# Patient Record
Sex: Female | Born: 1985 | Race: Black or African American | Hispanic: No | Marital: Single | Smoking: Current some day smoker
Health system: Southern US, Community
[De-identification: ages and names within clinical notes are randomized; demographics above are authoritative.]

## PROBLEM LIST (undated history)

## (undated) ENCOUNTER — Inpatient Hospital Stay (HOSPITAL_COMMUNITY): Payer: Self-pay

## (undated) DIAGNOSIS — F419 Anxiety disorder, unspecified: Secondary | ICD-10-CM

## (undated) DIAGNOSIS — F319 Bipolar disorder, unspecified: Secondary | ICD-10-CM

## (undated) DIAGNOSIS — F32A Depression, unspecified: Secondary | ICD-10-CM

## (undated) DIAGNOSIS — F259 Schizoaffective disorder, unspecified: Secondary | ICD-10-CM

## (undated) DIAGNOSIS — O139 Gestational [pregnancy-induced] hypertension without significant proteinuria, unspecified trimester: Secondary | ICD-10-CM

## (undated) DIAGNOSIS — R519 Headache, unspecified: Secondary | ICD-10-CM

## (undated) DIAGNOSIS — J45909 Unspecified asthma, uncomplicated: Secondary | ICD-10-CM

## (undated) DIAGNOSIS — F329 Major depressive disorder, single episode, unspecified: Secondary | ICD-10-CM

## (undated) DIAGNOSIS — R51 Headache: Secondary | ICD-10-CM

## (undated) HISTORY — DX: Schizoaffective disorder, unspecified: F25.9

## (undated) HISTORY — DX: Bipolar disorder, unspecified: F31.9

## (undated) HISTORY — PX: BACK SURGERY: SHX140

## (undated) HISTORY — DX: Headache, unspecified: R51.9

## (undated) HISTORY — DX: Headache: R51

## (undated) HISTORY — PX: DILATION AND CURETTAGE OF UTERUS: SHX78

---

## 2008-06-11 ENCOUNTER — Inpatient Hospital Stay (HOSPITAL_COMMUNITY): Admission: AD | Admit: 2008-06-11 | Discharge: 2008-06-12 | Payer: Self-pay | Admitting: Family Medicine

## 2011-02-18 ENCOUNTER — Inpatient Hospital Stay (INDEPENDENT_AMBULATORY_CARE_PROVIDER_SITE_OTHER): Payer: MEDICAID | Admitting: WVUPC-BEH MED

## 2011-02-18 ENCOUNTER — Encounter (HOSPITAL_BASED_OUTPATIENT_CLINIC_OR_DEPARTMENT_OTHER): Payer: MEDICAID | Admitting: WVUPC-BEH MED

## 2011-02-25 ENCOUNTER — Encounter (HOSPITAL_BASED_OUTPATIENT_CLINIC_OR_DEPARTMENT_OTHER): Payer: MEDICAID | Admitting: Psychiatry

## 2011-03-04 ENCOUNTER — Encounter (HOSPITAL_BASED_OUTPATIENT_CLINIC_OR_DEPARTMENT_OTHER): Payer: MEDICAID | Admitting: Psychiatry

## 2011-04-21 LAB — POCT PREGNANCY, URINE: Preg Test, Ur: POSITIVE

## 2011-04-21 LAB — WET PREP, GENITAL
Clue Cells Wet Prep HPF POC: NONE SEEN
Yeast Wet Prep HPF POC: NONE SEEN

## 2011-04-21 LAB — URINALYSIS, ROUTINE W REFLEX MICROSCOPIC
Hgb urine dipstick: NEGATIVE
Ketones, ur: NEGATIVE
Nitrite: NEGATIVE
pH: 6

## 2011-04-21 LAB — GC/CHLAMYDIA PROBE AMP, GENITAL: Chlamydia, DNA Probe: NEGATIVE

## 2012-03-04 ENCOUNTER — Encounter (HOSPITAL_BASED_OUTPATIENT_CLINIC_OR_DEPARTMENT_OTHER): Payer: No Typology Code available for payment source | Admitting: Psychiatry

## 2012-03-16 ENCOUNTER — Ambulatory Visit (INDEPENDENT_AMBULATORY_CARE_PROVIDER_SITE_OTHER): Payer: No Typology Code available for payment source | Admitting: WVUPC-BEH MED

## 2012-03-16 ENCOUNTER — Encounter (HOSPITAL_BASED_OUTPATIENT_CLINIC_OR_DEPARTMENT_OTHER): Payer: Self-pay | Admitting: WVUPC-BEH MED

## 2012-03-16 VITALS — BP 114/78 | HR 99 | Ht 67.0 in | Wt 214.0 lb

## 2012-03-16 DIAGNOSIS — F411 Generalized anxiety disorder: Secondary | ICD-10-CM

## 2012-03-16 DIAGNOSIS — F419 Anxiety disorder, unspecified: Secondary | ICD-10-CM

## 2012-03-16 DIAGNOSIS — F331 Major depressive disorder, recurrent, moderate: Secondary | ICD-10-CM

## 2012-03-16 HISTORY — DX: Anxiety disorder, unspecified: F41.9

## 2012-03-16 HISTORY — DX: Major depressive disorder, recurrent, moderate (CMS HCC): F33.1

## 2012-03-16 LAB — CBC
BASOPHILS: 1 % (ref 0.0–2.0)
EOSINOPHIL: 5.2 % — ABNORMAL HIGH (ref 0.0–4.0)
HCT: 36.7 % Volume (ref 36.0–47.0)
HGB: 12.1 gm/dL (ref 12.0–15.0)
LYMPHOCYTES: 38.6 % — ABNORMAL HIGH (ref 20.0–35.0)
MCH: 27.3 pg (ref 27.0–32.0)
MCHC: 33.1 % (ref 32.0–36.0)
MCV: 83 fL (ref 80–100)
MONOCYTES: 5.5 % (ref 0.0–8.0)
MPV: 10.7 fL — ABNORMAL HIGH (ref 6.6–9.3)
PLATELET COUNT: 216 10*3/uL (ref 140–450)
PMN'S: 49.7 % — ABNORMAL LOW (ref 50.0–78.0)
RBC: 4.45 10*6/uL (ref 4.20–5.40)
RDW: 15.5 % — ABNORMAL HIGH (ref 12.0–15.0)
WBC: 7.8 10*3/uL (ref 4.8–10.8)

## 2012-03-16 LAB — THYROID STIMULATING HORMONE (SENSITIVE TSH): TSH: 0.758 u[IU]/mL (ref 0.350–5.550)

## 2012-03-16 LAB — COMPREHENSIVE METABOLIC PANEL, NON-FASTING
ALBUMIN: 4.1 gm/dL (ref 3.4–5.0)
ALKALINE PHOSPHATASE: 80 U/L (ref 50–136)
ALT (SGPT): 29 U/L (ref 12–78)
AST (SGOT): 18 U/L (ref 15–37)
BILIRUBIN, TOTAL: 0.4 mg/dL (ref 0.2–1.0)
BUN: 14 mg/dL (ref 7–18)
CALCIUM: 9.2 mg/dL (ref 8.5–10.1)
CARBON DIOXIDE: 26 mmoL/L (ref 21–32)
CHLORIDE: 106 mmoL/L (ref 98–107)
CREATININE: 0.7 mg/dL (ref 0.6–1.3)
ESTIMATED GLOMERULAR FILTRATION RATE: >60 mL/min (ref >60)
GLUCOSE,NONFAST: 81 mg/dL (ref 74–106)
POTASSIUM: 4.1 mmoL/L (ref 3.5–5.1)
SODIUM: 144 mmoL/L (ref 136–145)
TOTAL PROTEIN: 8.6 gm/dL — ABNORMAL HIGH (ref 6.4–8.2)

## 2012-03-16 MED ORDER — ESCITALOPRAM 10 MG TABLET
10.0000 mg | ORAL_TABLET | Freq: Every day | ORAL | Status: DC
Start: 2012-03-16 — End: 2012-04-13

## 2012-03-16 NOTE — Patient Instructions (Signed)
If you need to cancel, reschedule, or change a future appointment, please call [phone] to do so.   You can reach me through voice mail at [phone] and I will do my best to get back to you within 1 business day.   If you have an emergency situation, please call 911 or go to the local emergency room. If you are in the [place] area, [facility] has a psychiatrist from the Behavioral Medicine Department on call at all times in their ED.   Dr.Wellington Winegarden [facility] [employer]
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
## 2012-04-13 ENCOUNTER — Ambulatory Visit (INDEPENDENT_AMBULATORY_CARE_PROVIDER_SITE_OTHER): Payer: No Typology Code available for payment source | Admitting: WVUPC-BEH MED

## 2012-04-13 VITALS — BP 119/86 | HR 90 | Ht 67.0 in | Wt 216.0 lb

## 2012-04-13 DIAGNOSIS — F331 Major depressive disorder, recurrent, moderate: Secondary | ICD-10-CM

## 2012-04-13 DIAGNOSIS — F411 Generalized anxiety disorder: Secondary | ICD-10-CM

## 2012-04-13 MED ORDER — QUETIAPINE 50 MG TABLET
ORAL_TABLET | ORAL | Status: AC
Start: 2012-04-13 — End: ?

## 2012-04-13 NOTE — Patient Instructions (Signed)
If you need to cancel, reschedule, or change a future appointment, please call [phone] to do so.   You can reach me through voice mail at [phone] and I will do my best to get back to you within 1 business day.   If you have an emergency situation, please call 911 or go to the local emergency room. If you are in the [place] area, [facility] has a psychiatrist from the Behavioral Medicine Department on call at all times in their ED.   Dr.Mayar Whittier [employer]
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
## 2012-05-11 ENCOUNTER — Encounter (HOSPITAL_BASED_OUTPATIENT_CLINIC_OR_DEPARTMENT_OTHER): Payer: No Typology Code available for payment source | Admitting: WVUPC-BEH MED

## 2012-05-13 ENCOUNTER — Ambulatory Visit (INDEPENDENT_AMBULATORY_CARE_PROVIDER_SITE_OTHER): Payer: No Typology Code available for payment source | Admitting: WVUPC-BEH MED

## 2012-05-13 VITALS — BP 130/87 | HR 73 | Ht 67.0 in | Wt 227.4 lb

## 2012-05-13 DIAGNOSIS — F411 Generalized anxiety disorder: Secondary | ICD-10-CM

## 2012-05-13 DIAGNOSIS — F1020 Alcohol dependence, uncomplicated: Secondary | ICD-10-CM

## 2012-05-13 DIAGNOSIS — F331 Major depressive disorder, recurrent, moderate: Secondary | ICD-10-CM
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
## 2012-05-18 ENCOUNTER — Encounter (HOSPITAL_BASED_OUTPATIENT_CLINIC_OR_DEPARTMENT_OTHER): Payer: No Typology Code available for payment source | Admitting: WVUPC-BEH MED

## 2012-05-26 ENCOUNTER — Encounter (HOSPITAL_BASED_OUTPATIENT_CLINIC_OR_DEPARTMENT_OTHER): Payer: No Typology Code available for payment source | Admitting: WVUPC-BEH MED

## 2012-05-27 NOTE — Progress Notes (Addendum)
pls see progress notes dated 05/13/2012

## 2012-06-03 ENCOUNTER — Telehealth (HOSPITAL_BASED_OUTPATIENT_CLINIC_OR_DEPARTMENT_OTHER): Payer: Self-pay | Admitting: WVUPC-BEH MED

## 2013-08-19 DIAGNOSIS — N9489 Other specified conditions associated with female genital organs and menstrual cycle: Secondary | ICD-10-CM

## 2013-08-19 DIAGNOSIS — O209 Hemorrhage in early pregnancy, unspecified: Secondary | ICD-10-CM

## 2013-08-19 DIAGNOSIS — R1110 Vomiting, unspecified: Secondary | ICD-10-CM

## 2014-11-09 ENCOUNTER — Encounter (INDEPENDENT_AMBULATORY_CARE_PROVIDER_SITE_OTHER): Payer: Self-pay | Admitting: Internal Medicine

## 2014-11-30 ENCOUNTER — Encounter (INDEPENDENT_AMBULATORY_CARE_PROVIDER_SITE_OTHER): Payer: Self-pay | Admitting: Psychiatry

## 2014-11-30 NOTE — Progress Notes (Signed)
Paper chart need to complete this request.

## 2014-12-03 NOTE — Progress Notes (Signed)
Records faxed to:  [phone].

## 2015-12-06 ENCOUNTER — Ambulatory Visit (HOSPITAL_COMMUNITY): Payer: Self-pay | Admitting: NURSE PRACTITIONER

## 2016-10-26 DIAGNOSIS — O209 Hemorrhage in early pregnancy, unspecified: Secondary | ICD-10-CM

## 2016-10-26 DIAGNOSIS — Z3A01 Less than 8 weeks gestation of pregnancy: Secondary | ICD-10-CM

## 2016-10-26 DIAGNOSIS — R102 Pelvic and perineal pain: Secondary | ICD-10-CM

## 2016-11-09 DIAGNOSIS — Z3A09 9 weeks gestation of pregnancy: Secondary | ICD-10-CM

## 2016-11-09 DIAGNOSIS — O26891 Other specified pregnancy related conditions, first trimester: Secondary | ICD-10-CM

## 2016-11-09 DIAGNOSIS — R102 Pelvic and perineal pain: Secondary | ICD-10-CM

## 2017-03-05 DIAGNOSIS — G510 Bell's palsy: Secondary | ICD-10-CM | POA: Insufficient documentation

## 2017-07-20 NOTE — L&D Delivery Note (Signed)
Patient is a 32 y.o. now G13P8 s/p NSVD at 503w2d, who was admitted for IOL for severe PEC.  She progressed with augmentation (Cytotec, Pitocin, AROM)  to complete and pushed less than a minutes to deliver.  Cord clamping delayed by several minutes then clamped by CNM and cut by sister of patient.  Placenta intact and spontaneous, bleeding moderate (Cytotec 800mcg given). Mom and baby stable prior to transfer to postpartum. She plans on breastfeeding. She requests Nexplanon for birth control.  Delivery Note At 4:03 AM a viable and healthy female was delivered via Vaginal, Spontaneous (Presentation: OA).  APGAR: 8, 9; weight pending.   Placenta intact and spontaneous, bleeding moderate. 3V Cord.    Anesthesia:  Epidural  Episiotomy: None Lacerations: None Suture Repair: None Est. Blood Loss (mL): 463  Mom to postpartum.  Baby to Couplet care / Skin to Skin.  Sharyon CableVeronica C Leota Maka CNM 07/04/2018, 4:41 AM

## 2017-12-04 DIAGNOSIS — O26851 Spotting complicating pregnancy, first trimester: Secondary | ICD-10-CM

## 2017-12-04 DIAGNOSIS — R109 Unspecified abdominal pain: Secondary | ICD-10-CM

## 2017-12-04 DIAGNOSIS — Z3A01 Less than 8 weeks gestation of pregnancy: Secondary | ICD-10-CM

## 2018-02-11 ENCOUNTER — Inpatient Hospital Stay (HOSPITAL_COMMUNITY): Payer: Medicare Other

## 2018-02-11 ENCOUNTER — Inpatient Hospital Stay (HOSPITAL_COMMUNITY)
Admission: AD | Admit: 2018-02-11 | Discharge: 2018-02-11 | Disposition: A | Discharge status: Home / Self Care | Payer: Medicare Other | Source: Ambulatory Visit | Attending: Obstetrics and Gynecology | Admitting: Obstetrics and Gynecology

## 2018-02-11 ENCOUNTER — Inpatient Hospital Stay (HOSPITAL_BASED_OUTPATIENT_CLINIC_OR_DEPARTMENT_OTHER): Payer: Medicare Other

## 2018-02-11 ENCOUNTER — Encounter (HOSPITAL_COMMUNITY): Payer: Self-pay | Admitting: *Deleted

## 2018-02-11 DIAGNOSIS — R109 Unspecified abdominal pain: Secondary | ICD-10-CM | POA: Diagnosis not present

## 2018-02-11 DIAGNOSIS — O99512 Diseases of the respiratory system complicating pregnancy, second trimester: Secondary | ICD-10-CM | POA: Diagnosis not present

## 2018-02-11 DIAGNOSIS — F419 Anxiety disorder, unspecified: Secondary | ICD-10-CM | POA: Diagnosis not present

## 2018-02-11 DIAGNOSIS — O26891 Other specified pregnancy related conditions, first trimester: Secondary | ICD-10-CM

## 2018-02-11 DIAGNOSIS — Z3A16 16 weeks gestation of pregnancy: Secondary | ICD-10-CM | POA: Insufficient documentation

## 2018-02-11 DIAGNOSIS — O26892 Other specified pregnancy related conditions, second trimester: Secondary | ICD-10-CM

## 2018-02-11 DIAGNOSIS — F329 Major depressive disorder, single episode, unspecified: Secondary | ICD-10-CM | POA: Diagnosis not present

## 2018-02-11 DIAGNOSIS — R51 Headache: Secondary | ICD-10-CM | POA: Insufficient documentation

## 2018-02-11 DIAGNOSIS — Z87891 Personal history of nicotine dependence: Secondary | ICD-10-CM | POA: Insufficient documentation

## 2018-02-11 DIAGNOSIS — Z87442 Personal history of urinary calculi: Secondary | ICD-10-CM | POA: Diagnosis not present

## 2018-02-11 DIAGNOSIS — J45909 Unspecified asthma, uncomplicated: Secondary | ICD-10-CM | POA: Insufficient documentation

## 2018-02-11 DIAGNOSIS — O99212 Obesity complicating pregnancy, second trimester: Secondary | ICD-10-CM | POA: Diagnosis not present

## 2018-02-11 DIAGNOSIS — O26899 Other specified pregnancy related conditions, unspecified trimester: Secondary | ICD-10-CM

## 2018-02-11 DIAGNOSIS — O99342 Other mental disorders complicating pregnancy, second trimester: Secondary | ICD-10-CM | POA: Insufficient documentation

## 2018-02-11 DIAGNOSIS — M549 Dorsalgia, unspecified: Secondary | ICD-10-CM | POA: Diagnosis not present

## 2018-02-11 DIAGNOSIS — O99891 Other specified diseases and conditions complicating pregnancy: Secondary | ICD-10-CM

## 2018-02-11 DIAGNOSIS — O9989 Other specified diseases and conditions complicating pregnancy, childbirth and the puerperium: Secondary | ICD-10-CM

## 2018-02-11 HISTORY — DX: Major depressive disorder, single episode, unspecified: F32.9

## 2018-02-11 HISTORY — DX: Depression, unspecified: F32.A

## 2018-02-11 HISTORY — DX: Unspecified asthma, uncomplicated: J45.909

## 2018-02-11 HISTORY — DX: Gestational (pregnancy-induced) hypertension without significant proteinuria, unspecified trimester: O13.9

## 2018-02-11 HISTORY — DX: Anxiety disorder, unspecified: F41.9

## 2018-02-11 LAB — COMPREHENSIVE METABOLIC PANEL
ALT: 12 U/L (ref 0–44)
AST: 15 U/L (ref 15–41)
Albumin: 3.5 g/dL (ref 3.5–5.0)
Alkaline Phosphatase: 34 U/L — ABNORMAL LOW (ref 38–126)
Anion gap: 10 (ref 5–15)
BUN: 8 mg/dL (ref 6–20)
CO2: 21 mmol/L — ABNORMAL LOW (ref 22–32)
Calcium: 9.2 mg/dL (ref 8.9–10.3)
Chloride: 104 mmol/L (ref 98–111)
Creatinine, Ser: 0.46 mg/dL (ref 0.44–1.00)
GFR calc Af Amer: >60 mL/min (ref >60)
GFR calc non Af Amer: >60 mL/min (ref >60)
Glucose, Bld: 101 mg/dL — ABNORMAL HIGH (ref 70–99)
Potassium: 3.7 mmol/L (ref 3.5–5.1)
Sodium: 135 mmol/L (ref 135–145)
Total Bilirubin: 0.6 mg/dL (ref 0.3–1.2)
Total Protein: 7.3 g/dL (ref 6.5–8.1)

## 2018-02-11 LAB — CBC WITH DIFFERENTIAL/PLATELET
Basophils Absolute: 0.1 10*3/uL (ref 0.0–0.1)
Basophils Relative: 1 %
Eosinophils Absolute: 0.3 10*3/uL (ref 0.0–0.7)
Eosinophils Relative: 3 %
HCT: 38.8 % (ref 36.0–46.0)
Hemoglobin: 13.5 g/dL (ref 12.0–15.0)
Lymphocytes Relative: 38 %
Lymphs Abs: 3.2 10*3/uL (ref 0.7–4.0)
MCH: 30.6 pg (ref 26.0–34.0)
MCHC: 34.8 g/dL (ref 30.0–36.0)
MCV: 88 fL (ref 78.0–100.0)
Monocytes Absolute: 0.7 10*3/uL (ref 0.1–1.0)
Monocytes Relative: 8 %
Neutro Abs: 4.2 10*3/uL (ref 1.7–7.7)
Neutrophils Relative %: 50 %
Platelets: 176 10*3/uL (ref 150–400)
RBC: 4.41 MIL/uL (ref 3.87–5.11)
RDW: 13.8 % (ref 11.5–15.5)
WBC: 8.5 10*3/uL (ref 4.0–10.5)

## 2018-02-11 LAB — URINALYSIS, ROUTINE W REFLEX MICROSCOPIC
Bilirubin Urine: NEGATIVE
GLUCOSE, UA: NEGATIVE mg/dL
HGB URINE DIPSTICK: NEGATIVE
KETONES UR: NEGATIVE mg/dL
Leukocytes, UA: NEGATIVE
Nitrite: NEGATIVE
Protein, ur: NEGATIVE mg/dL
Specific Gravity, Urine: 1.024 (ref 1.005–1.030)
pH: 5 (ref 5.0–8.0)

## 2018-02-11 LAB — POCT PREGNANCY, URINE: PREG TEST UR: POSITIVE — AB

## 2018-02-11 MED ORDER — CYCLOBENZAPRINE HCL 10 MG PO TABS: 10.0000 mg | ORAL_TABLET | Freq: Two times a day (BID) | ORAL | 0 refills | Status: DC | PRN

## 2018-02-11 MED ORDER — CYCLOBENZAPRINE HCL 10 MG PO TABS
10.0000 mg | ORAL_TABLET | Freq: Once | ORAL | Status: AC
  Administered 2018-02-11: 10 mg via ORAL
  Filled 2018-02-11: qty 1

## 2018-02-11 NOTE — MAU Provider Note (Signed)
Chief Complaint: Back Pain and Headache   First Provider Initiated Contact with Patient 02/11/18 2025      SUBJECTIVE HPI: Brianna Booth is a 31 y.o. [id number] at [redacted]w[redacted]d by LMP who presents to maternity admissions reporting back pain and abdominal pain. She reports the pain has been occurring for the past week, rates pain 10/10- has taken Tylenol and Aleve for pain with no relief. She last took Tylenol this morning. She reports back pain and abdominal pain is a throbbing sensation. She reports pain as being the same pain when she had a kidney stone, she has a hx of kidney stones and reports the last one being in 2017. She reports urinary symptoms of burning when she urinates and reports urine being dark yellow with no odor. She denies vaginal bleeding, vaginal itching/burning, dizziness, n/v, or fever/chills. She recently moved from [place] and has not started prenatal care.   Past Medical History:  Diagnosis Date  . Anxiety   . Asthma   . Depression   . Pregnancy induced hypertension    Past Surgical History:  Procedure Laterality Date  . DILATION AND CURETTAGE OF UTERUS     Social History   Socioeconomic History  . Marital status: Single    Spouse name: Not on file  . Number of children: Not on file  . Years of education: Not on file  . Highest education level: Not on file  Occupational History  . Not on file  Social Needs  . Financial resource strain: Not on file  . Food insecurity:    Worry: Not on file    Inability: Not on file  . Transportation needs:    Medical: Not on file    Non-medical: Not on file  Tobacco Use  . Smoking status: Former Smoker    Last attempt to quit: 12/12/2017    Years since quitting: 0.1  . Smokeless tobacco: Never Used  Substance and Sexual Activity  . Alcohol use: Never    Frequency: Never  . Drug use: Never  . Sexual activity: Yes  Lifestyle  . Physical activity:    Days per week: Not on file    Minutes per session: Not on file   . Stress: Not on file  Relationships  . Social connections:    Talks on phone: Not on file    Gets together: Not on file    Attends religious service: Not on file    Active member of club or organization: Not on file    Attends meetings of clubs or organizations: Not on file    Relationship status: Not on file  . Intimate partner violence:    Fear of current or ex partner: Not on file    Emotionally abused: Not on file    Physically abused: Not on file    Forced sexual activity: Not on file  Other Topics Concern  . Not on file  Social History Narrative  . Not on file   No current facility-administered medications on file prior to encounter.    No current outpatient medications on file prior to encounter.   No Known Allergies  ROS:  Review of Systems  Constitutional: Negative.   Respiratory: Negative.   Cardiovascular: Negative.   Gastrointestinal: Positive for abdominal pain. Negative for constipation, diarrhea, nausea and vomiting.  Genitourinary: Positive for dysuria and flank pain. Negative for difficulty urinating, frequency, hematuria, urgency, vaginal bleeding and vaginal discharge.  Musculoskeletal: Positive for back pain.   I have reviewed patient's  Past Medical Hx, Surgical Hx, Family Hx, Social Hx, medications and allergies.   Physical Exam   Patient Vitals for the past 24 hrs:  BP Temp Pulse Resp Height Weight  02/11/18 2240 (!) 131/93 - 78 18 - -  02/11/18 1933 131/82 - 79 - - -  02/11/18 1931 - 98.6 F (37 C) - 18 5\' 7"  (1.702 m) 227 lb (103 kg)   Constitutional: Well-developed, well-nourished female in no acute distress.  Cardiovascular: normal rate Respiratory: normal effort GI: Abd soft, suprapubic tender. Pos BS x 4 MS: Extremities nontender, no edema, normal ROM Neurologic: Alert and oriented x 4.  GU: Pos CVAT. PELVIC EXAM: deferred  FHT 152 by doppler  LAB RESULTS Results for orders placed or performed during the hospital encounter of  02/11/18 (from the past 24 hour(s))  Urinalysis, Routine w reflex microscopic     Status: None   Collection Time: 02/11/18  7:43 PM  Result Value Ref Range   Color, Urine YELLOW YELLOW   APPearance CLEAR CLEAR   Specific Gravity, Urine 1.024 1.005 - 1.030   pH 5.0 5.0 - 8.0   Glucose, UA NEGATIVE NEGATIVE mg/dL   Hgb urine dipstick NEGATIVE NEGATIVE   Bilirubin Urine NEGATIVE NEGATIVE   Ketones, ur NEGATIVE NEGATIVE mg/dL   Protein, ur NEGATIVE NEGATIVE mg/dL   Nitrite NEGATIVE NEGATIVE   Leukocytes, UA NEGATIVE NEGATIVE  Pregnancy, urine POC     Status: Abnormal   Collection Time: 02/11/18  7:54 PM  Result Value Ref Range   Preg Test, Ur POSITIVE (A) NEGATIVE  CBC with Differential     Status: None   Collection Time: 02/11/18  8:43 PM  Result Value Ref Range   WBC 8.5 4.0 - 10.5 K/uL   RBC 4.41 3.87 - 5.11 MIL/uL   Hemoglobin 13.5 12.0 - 15.0 g/dL   HCT 16.1 09.6 - 04.5 %   MCV 88.0 78.0 - 100.0 fL   MCH 30.6 26.0 - 34.0 pg   MCHC 34.8 30.0 - 36.0 g/dL   RDW 40.9 81.1 - 91.4 %   Platelets 176 150 - 400 K/uL   Neutrophils Relative % 50 %   Neutro Abs 4.2 1.7 - 7.7 K/uL   Lymphocytes Relative 38 %   Lymphs Abs 3.2 0.7 - 4.0 K/uL   Monocytes Relative 8 %   Monocytes Absolute 0.7 0.1 - 1.0 K/uL   Eosinophils Relative 3 %   Eosinophils Absolute 0.3 0.0 - 0.7 K/uL   Basophils Relative 1 %   Basophils Absolute 0.1 0.0 - 0.1 K/uL  Comprehensive metabolic panel     Status: Abnormal   Collection Time: 02/11/18  8:43 PM  Result Value Ref Range   Sodium 135 135 - 145 mmol/L   Potassium 3.7 3.5 - 5.1 mmol/L   Chloride 104 98 - 111 mmol/L   CO2 21 (L) 22 - 32 mmol/L   Glucose, Bld 101 (H) 70 - 99 mg/dL   BUN 8 6 - 20 mg/dL   Creatinine, Ser 7.82 0.44 - 1.00 mg/dL   Calcium 9.2 8.9 - 95.6 mg/dL   Total Protein 7.3 6.5 - 8.1 g/dL   Albumin 3.5 3.5 - 5.0 g/dL   AST 15 15 - 41 U/L   ALT 12 0 - 44 U/L   Alkaline Phosphatase 34 (L) 38 - 126 U/L   Total Bilirubin 0.6 0.3 - 1.2  mg/dL   GFR calc non Af Amer >60 >60 mL/min   GFR calc Af Amer >  60 >60 mL/min   Anion gap 10 5 - 15    IMAGING [place] Renal  Result Date: 02/11/2018 CLINICAL DATA:  Low abdominal and back pain for 1 week. Dysuria. Urinalysis is normal. History of renal calculi. Patient is pregnant. EXAM: RENAL / URINARY TRACT ULTRASOUND COMPLETE COMPARISON:  None. FINDINGS: Right Kidney: Length: 12.1 cm. Echogenicity within normal limits. No mass or hydronephrosis visualized. Left Kidney: Length: 12.1 cm. Echogenicity within normal limits. No mass or hydronephrosis visualized. Bladder: No bladder wall thickening or filling defects. Bilateral urine flow jets are demonstrated on color flow Doppler imaging. A pregnancy is incidentally noted in the pelvis but is not evaluated on this examination. IMPRESSION: Normal ultrasound appearance of the kidneys and bladder. No hydronephrosis. Electronically Signed   By: Burman NievesWilliam  Stevens M.D.   On: 02/11/2018 22:01    MAU Management/MDM: Orders Placed This Encounter  Procedures  . Culture, OB Urine  . US RENAL  . US MFM OB LIMITED  . Urinalysis, Routine w reflex microscopic  . CBC with Differential  . Comprehensive metabolic panel  . Pregnancy, urine POC  . Discharge patient Discharge disposition: 01-Home or Self Care; Discharge patient date: 02/11/2018   Urine culture- pending  US reviewed with patient with new due date of 07/27/18 and 1413w2d gestation  CBC and CMP- WNL   Meds ordered this encounter  Medications  . cyclobenzaprine (FLEXERIL) tablet 10 mg  . cyclobenzaprine (FLEXERIL) 10 MG tablet    Sig: Take 1 tablet (10 mg total) by mouth 2 (two) times daily as needed for muscle spasms.    Dispense:  15 tablet    Refill:  0    Order Specific Question:   Supervising Provider    Answer:   Tilda BurrowFERGUSON, JOHN V [[id number]]   Treatments in MAU included Flexeril for back pain, patient reports relief of back pain with medication. US results discussed with patient. List of OBGYN  providers in [place] given. Pt discharged. Pt stable at time of discharge.   ASSESSMENT 1. Back pain during pregnancy in second trimester   2. Abdominal pain during pregnancy   3. [redacted] weeks gestation of pregnancy     PLAN Discharge home Make appointment to be seen for prenatal appointment  Return to MAU as needed for emergencies  Rx for Flexeril PRN  Safe medication list given  List of OB providers given    Allergies as of 02/11/2018   No Known Allergies     Medication List    TAKE these medications   acetaminophen 500 MG tablet Commonly known as:  TYLENOL Take 1,000 mg by mouth every 6 (six) hours as needed for moderate pain.   albuterol 108 (90 Base) MCG/ACT inhaler Commonly known as:  PROVENTIL HFA;VENTOLIN HFA Inhale 1-2 puffs into the lungs every 6 (six) hours as needed for wheezing or shortness of breath.   alprazolam 2 MG tablet Commonly known as:  XANAX Take 2 mg by mouth 4 (four) times daily as needed for anxiety.   ARIPiprazole 20 MG tablet Commonly known as:  ABILIFY Take 20 mg by mouth daily.   busPIRone 30 MG tablet Commonly known as:  BUSPAR Take 30 mg by mouth at bedtime.   cyclobenzaprine 10 MG tablet Commonly known as:  FLEXERIL Take 1 tablet (10 mg total) by mouth 2 (two) times daily as needed for muscle spasms.   diazepam 10 MG tablet Commonly known as:  VALIUM Take 10 mg by mouth at bedtime as needed for anxiety.  hydrOXYzine 50 MG capsule Commonly known as:  VISTARIL Take 100 mg by mouth 2 (two) times daily.   lamoTRIgine 200 MG tablet Commonly known as:  LAMICTAL Take 200 mg by mouth 3 (three) times daily.   montelukast 10 MG tablet Commonly known as:  SINGULAIR Take 10 mg by mouth at bedtime.   zolpidem 10 MG tablet Commonly known as:  AMBIEN Take 10 mg by mouth at bedtime as needed for sleep.       Steward Drone  Certified Nurse-Midwife 02/12/2018  1:15 AM

## 2018-02-11 NOTE — MAU Note (Signed)
Pain in both sides of back and lower abd. Sometimes hurts when I pee. Symptoms present fo about a wk. Hx kidney stones. Pos upt at home. LMP sometime in April. No PNC yet

## 2018-02-13 LAB — CULTURE, OB URINE

## 2018-02-19 ENCOUNTER — Emergency Department (HOSPITAL_COMMUNITY): Payer: No Typology Code available for payment source

## 2018-02-19 ENCOUNTER — Emergency Department (HOSPITAL_COMMUNITY)
Admission: EM | Admit: 2018-02-19 | Discharge: 2018-02-20 | Disposition: A | Discharge status: Home / Self Care | Payer: No Typology Code available for payment source | Attending: Emergency Medicine | Admitting: Emergency Medicine

## 2018-02-19 ENCOUNTER — Encounter (HOSPITAL_COMMUNITY): Payer: Self-pay | Admitting: *Deleted

## 2018-02-19 ENCOUNTER — Other Ambulatory Visit: Payer: Self-pay

## 2018-02-19 DIAGNOSIS — J45909 Unspecified asthma, uncomplicated: Secondary | ICD-10-CM | POA: Diagnosis not present

## 2018-02-19 DIAGNOSIS — Y939 Activity, unspecified: Secondary | ICD-10-CM | POA: Diagnosis not present

## 2018-02-19 DIAGNOSIS — Z87891 Personal history of nicotine dependence: Secondary | ICD-10-CM | POA: Insufficient documentation

## 2018-02-19 DIAGNOSIS — M7918 Myalgia, other site: Secondary | ICD-10-CM

## 2018-02-19 DIAGNOSIS — R102 Pelvic and perineal pain: Secondary | ICD-10-CM | POA: Insufficient documentation

## 2018-02-19 DIAGNOSIS — M542 Cervicalgia: Secondary | ICD-10-CM | POA: Insufficient documentation

## 2018-02-19 DIAGNOSIS — Z79899 Other long term (current) drug therapy: Secondary | ICD-10-CM | POA: Insufficient documentation

## 2018-02-19 DIAGNOSIS — O9989 Other specified diseases and conditions complicating pregnancy, childbirth and the puerperium: Secondary | ICD-10-CM | POA: Insufficient documentation

## 2018-02-19 DIAGNOSIS — Z3A18 18 weeks gestation of pregnancy: Secondary | ICD-10-CM | POA: Diagnosis not present

## 2018-02-19 DIAGNOSIS — Y929 Unspecified place or not applicable: Secondary | ICD-10-CM | POA: Insufficient documentation

## 2018-02-19 DIAGNOSIS — Y999 Unspecified external cause status: Secondary | ICD-10-CM | POA: Diagnosis not present

## 2018-02-19 LAB — URINALYSIS, ROUTINE W REFLEX MICROSCOPIC
BILIRUBIN URINE: NEGATIVE
GLUCOSE, UA: NEGATIVE mg/dL
HGB URINE DIPSTICK: NEGATIVE
KETONES UR: NEGATIVE mg/dL
Leukocytes, UA: NEGATIVE
Nitrite: NEGATIVE
PROTEIN: NEGATIVE mg/dL
Specific Gravity, Urine: 1.028 (ref 1.005–1.030)
pH: 5 (ref 5.0–8.0)

## 2018-02-19 LAB — COMPREHENSIVE METABOLIC PANEL
ALT: 10 U/L (ref 0–44)
AST: 13 U/L — ABNORMAL LOW (ref 15–41)
Albumin: 3.2 g/dL — ABNORMAL LOW (ref 3.5–5.0)
Alkaline Phosphatase: 37 U/L — ABNORMAL LOW (ref 38–126)
Anion gap: 8 (ref 5–15)
BILIRUBIN TOTAL: 0.2 mg/dL — AB (ref 0.3–1.2)
BUN: 6 mg/dL (ref 6–20)
CO2: 21 mmol/L — ABNORMAL LOW (ref 22–32)
CREATININE: 0.41 mg/dL — AB (ref 0.44–1.00)
Calcium: 8.7 mg/dL — ABNORMAL LOW (ref 8.9–10.3)
Chloride: 109 mmol/L (ref 98–111)
GFR calc Af Amer: >60 mL/min (ref >60)
Glucose, Bld: 75 mg/dL (ref 70–99)
Potassium: 3.7 mmol/L (ref 3.5–5.1)
SODIUM: 138 mmol/L (ref 135–145)
TOTAL PROTEIN: 6.5 g/dL (ref 6.5–8.1)

## 2018-02-19 LAB — CBC WITH DIFFERENTIAL/PLATELET
BASOS ABS: 0 10*3/uL (ref 0.0–0.1)
BASOS PCT: 0 %
EOS ABS: 0.2 10*3/uL (ref 0.0–0.7)
EOS PCT: 2 %
HCT: 35.6 % — ABNORMAL LOW (ref 36.0–46.0)
Hemoglobin: 11.9 g/dL — ABNORMAL LOW (ref 12.0–15.0)
LYMPHS ABS: 3.1 10*3/uL (ref 0.7–4.0)
Lymphocytes Relative: 34 %
MCH: 30 pg (ref 26.0–34.0)
MCHC: 33.4 g/dL (ref 30.0–36.0)
MCV: 89.7 fL (ref 78.0–100.0)
Monocytes Absolute: 0.9 10*3/uL (ref 0.1–1.0)
Monocytes Relative: 10 %
Neutro Abs: 4.8 10*3/uL (ref 1.7–7.7)
Neutrophils Relative %: 54 %
PLATELETS: 144 10*3/uL — AB (ref 150–400)
RBC: 3.97 MIL/uL (ref 3.87–5.11)
RDW: 13.6 % (ref 11.5–15.5)
WBC: 9.1 10*3/uL (ref 4.0–10.5)

## 2018-02-19 LAB — LIPASE, BLOOD: LIPASE: 31 U/L (ref 11–51)

## 2018-02-19 MED ORDER — PRENATAL COMPLETE 14-0.4 MG PO TABS: 1.0000 | ORAL_TABLET | Freq: Every day | ORAL | 6 refills | Status: AC

## 2018-02-19 MED ORDER — CYCLOBENZAPRINE HCL 10 MG PO TABS: 10.0000 mg | ORAL_TABLET | Freq: Two times a day (BID) | ORAL | 0 refills | Status: DC | PRN

## 2018-02-19 MED ORDER — CYCLOBENZAPRINE HCL 10 MG PO TABS
10.0000 mg | ORAL_TABLET | Freq: Once | ORAL | Status: AC
  Administered 2018-02-19: 10 mg via ORAL
  Filled 2018-02-19: qty 1

## 2018-02-19 MED ORDER — ACETAMINOPHEN 500 MG PO TABS
1000.0000 mg | ORAL_TABLET | Freq: Once | ORAL | Status: AC
  Administered 2018-02-19: 1000 mg via ORAL
  Filled 2018-02-19: qty 2
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
## 2018-02-19 NOTE — ED Notes (Signed)
Patient is unable to urinate at this time but stated she would be willing to try "in the next few minutes".
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
## 2018-02-21 ENCOUNTER — Encounter (HOSPITAL_COMMUNITY): Payer: Self-pay

## 2018-02-21 ENCOUNTER — Emergency Department (HOSPITAL_COMMUNITY): Payer: No Typology Code available for payment source

## 2018-02-21 ENCOUNTER — Other Ambulatory Visit: Payer: Self-pay

## 2018-02-21 ENCOUNTER — Emergency Department (HOSPITAL_COMMUNITY)
Admission: EM | Admit: 2018-02-21 | Discharge: 2018-02-21 | Disposition: A | Discharge status: Home / Self Care | Payer: No Typology Code available for payment source | Attending: Emergency Medicine | Admitting: Emergency Medicine

## 2018-02-21 DIAGNOSIS — R51 Headache: Secondary | ICD-10-CM | POA: Diagnosis not present

## 2018-02-21 DIAGNOSIS — O9989 Other specified diseases and conditions complicating pregnancy, childbirth and the puerperium: Secondary | ICD-10-CM | POA: Diagnosis present

## 2018-02-21 DIAGNOSIS — R1030 Lower abdominal pain, unspecified: Secondary | ICD-10-CM | POA: Diagnosis not present

## 2018-02-21 DIAGNOSIS — Z3492 Encounter for supervision of normal pregnancy, unspecified, second trimester: Secondary | ICD-10-CM

## 2018-02-21 DIAGNOSIS — Z79899 Other long term (current) drug therapy: Secondary | ICD-10-CM | POA: Insufficient documentation

## 2018-02-21 DIAGNOSIS — J45909 Unspecified asthma, uncomplicated: Secondary | ICD-10-CM | POA: Diagnosis not present

## 2018-02-21 DIAGNOSIS — Z87891 Personal history of nicotine dependence: Secondary | ICD-10-CM | POA: Insufficient documentation

## 2018-02-21 DIAGNOSIS — Z3A18 18 weeks gestation of pregnancy: Secondary | ICD-10-CM | POA: Insufficient documentation

## 2018-02-21 LAB — URINALYSIS, ROUTINE W REFLEX MICROSCOPIC
BILIRUBIN URINE: NEGATIVE
Glucose, UA: NEGATIVE mg/dL
Hgb urine dipstick: NEGATIVE
KETONES UR: NEGATIVE mg/dL
Leukocytes, UA: NEGATIVE
NITRITE: NEGATIVE
PH: 6 (ref 5.0–8.0)
PROTEIN: NEGATIVE mg/dL
Specific Gravity, Urine: 1.019 (ref 1.005–1.030)

## 2018-02-21 LAB — BASIC METABOLIC PANEL
Anion gap: 8 (ref 5–15)
BUN: <5 mg/dL — ABNORMAL LOW (ref 6–20)
CO2: 24 mmol/L (ref 22–32)
CREATININE: 0.4 mg/dL — AB (ref 0.44–1.00)
Calcium: 9.1 mg/dL (ref 8.9–10.3)
Chloride: 106 mmol/L (ref 98–111)
GFR calc Af Amer: >60 mL/min (ref >60)
GFR calc non Af Amer: >60 mL/min (ref >60)
Glucose, Bld: 74 mg/dL (ref 70–99)
POTASSIUM: 4 mmol/L (ref 3.5–5.1)
Sodium: 138 mmol/L (ref 135–145)

## 2018-02-21 LAB — ABO/RH: ABO/RH(D): O POS

## 2018-02-21 LAB — CBC WITH DIFFERENTIAL/PLATELET
BASOS ABS: 0 10*3/uL (ref 0.0–0.1)
BASOS PCT: 0 %
EOS ABS: 0.3 10*3/uL (ref 0.0–0.7)
Eosinophils Relative: 3 %
HCT: 38.3 % (ref 36.0–46.0)
HEMOGLOBIN: 12.8 g/dL (ref 12.0–15.0)
Lymphocytes Relative: 33 %
Lymphs Abs: 3.2 10*3/uL (ref 0.7–4.0)
MCH: 29.9 pg (ref 26.0–34.0)
MCHC: 33.4 g/dL (ref 30.0–36.0)
MCV: 89.5 fL (ref 78.0–100.0)
Monocytes Absolute: 0.8 10*3/uL (ref 0.1–1.0)
Monocytes Relative: 9 %
NEUTROS PCT: 55 %
Neutro Abs: 5.3 10*3/uL (ref 1.7–7.7)
Platelets: 185 10*3/uL (ref 150–400)
RBC: 4.28 MIL/uL (ref 3.87–5.11)
RDW: 13.6 % (ref 11.5–15.5)
WBC: 9.7 10*3/uL (ref 4.0–10.5)

## 2018-02-21 LAB — TYPE AND SCREEN
ABO/RH(D): O POS
ANTIBODY SCREEN: NEGATIVE

## 2018-02-21 MED ORDER — ACETAMINOPHEN 500 MG PO TABS
1000.0000 mg | ORAL_TABLET | Freq: Once | ORAL | Status: AC
  Administered 2018-02-21: 1000 mg via ORAL
  Filled 2018-02-21: qty 2

## 2018-02-21 MED ORDER — SODIUM CHLORIDE 0.9 % IV BOLUS
1000.0000 mL | Freq: Once | INTRAVENOUS | Status: AC
  Administered 2018-02-21: 1000 mL via INTRAVENOUS
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
## 2018-02-21 NOTE — ED Notes (Signed)
US at bedside
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
## 2018-02-22 ENCOUNTER — Encounter: Payer: Self-pay | Admitting: Nurse Practitioner

## 2018-02-23 ENCOUNTER — Inpatient Hospital Stay (HOSPITAL_COMMUNITY)
Admission: AD | Admit: 2018-02-23 | Discharge: 2018-02-23 | Disposition: A | Discharge status: Home / Self Care | Payer: No Typology Code available for payment source | Source: Ambulatory Visit | Attending: Obstetrics & Gynecology | Admitting: Obstetrics & Gynecology

## 2018-02-23 ENCOUNTER — Encounter (HOSPITAL_COMMUNITY): Payer: Self-pay | Admitting: *Deleted

## 2018-02-23 ENCOUNTER — Other Ambulatory Visit: Payer: Self-pay

## 2018-02-23 DIAGNOSIS — F329 Major depressive disorder, single episode, unspecified: Secondary | ICD-10-CM | POA: Insufficient documentation

## 2018-02-23 DIAGNOSIS — F419 Anxiety disorder, unspecified: Secondary | ICD-10-CM | POA: Insufficient documentation

## 2018-02-23 DIAGNOSIS — Z3A18 18 weeks gestation of pregnancy: Secondary | ICD-10-CM | POA: Insufficient documentation

## 2018-02-23 DIAGNOSIS — J45909 Unspecified asthma, uncomplicated: Secondary | ICD-10-CM | POA: Diagnosis not present

## 2018-02-23 DIAGNOSIS — R51 Headache: Secondary | ICD-10-CM | POA: Diagnosis present

## 2018-02-23 DIAGNOSIS — Z8249 Family history of ischemic heart disease and other diseases of the circulatory system: Secondary | ICD-10-CM | POA: Insufficient documentation

## 2018-02-23 DIAGNOSIS — O219 Vomiting of pregnancy, unspecified: Secondary | ICD-10-CM | POA: Diagnosis not present

## 2018-02-23 DIAGNOSIS — Z87891 Personal history of nicotine dependence: Secondary | ICD-10-CM | POA: Insufficient documentation

## 2018-02-23 DIAGNOSIS — O99342 Other mental disorders complicating pregnancy, second trimester: Secondary | ICD-10-CM | POA: Insufficient documentation

## 2018-02-23 DIAGNOSIS — Z3492 Encounter for supervision of normal pregnancy, unspecified, second trimester: Secondary | ICD-10-CM

## 2018-02-23 DIAGNOSIS — O99512 Diseases of the respiratory system complicating pregnancy, second trimester: Secondary | ICD-10-CM | POA: Diagnosis not present

## 2018-02-23 DIAGNOSIS — Z79899 Other long term (current) drug therapy: Secondary | ICD-10-CM | POA: Diagnosis not present

## 2018-02-23 DIAGNOSIS — O26892 Other specified pregnancy related conditions, second trimester: Secondary | ICD-10-CM | POA: Diagnosis not present

## 2018-02-23 DIAGNOSIS — O09292 Supervision of pregnancy with other poor reproductive or obstetric history, second trimester: Secondary | ICD-10-CM

## 2018-02-23 LAB — COMPREHENSIVE METABOLIC PANEL
ALT: 10 U/L (ref 0–44)
AST: 16 U/L (ref 15–41)
Albumin: 3.8 g/dL (ref 3.5–5.0)
Alkaline Phosphatase: 37 U/L — ABNORMAL LOW (ref 38–126)
Anion gap: 11 (ref 5–15)
BILIRUBIN TOTAL: 0.6 mg/dL (ref 0.3–1.2)
BUN: 6 mg/dL (ref 6–20)
CALCIUM: 9.1 mg/dL (ref 8.9–10.3)
CO2: 22 mmol/L (ref 22–32)
CREATININE: 0.41 mg/dL — AB (ref 0.44–1.00)
Chloride: 101 mmol/L (ref 98–111)
GFR calc Af Amer: >60 mL/min (ref >60)
GFR calc non Af Amer: >60 mL/min (ref >60)
Glucose, Bld: 90 mg/dL (ref 70–99)
Potassium: 3.9 mmol/L (ref 3.5–5.1)
Sodium: 134 mmol/L — ABNORMAL LOW (ref 135–145)
TOTAL PROTEIN: 7.5 g/dL (ref 6.5–8.1)

## 2018-02-23 LAB — URINALYSIS, ROUTINE W REFLEX MICROSCOPIC
Bilirubin Urine: NEGATIVE
Glucose, UA: NEGATIVE mg/dL
Hgb urine dipstick: NEGATIVE
Ketones, ur: NEGATIVE mg/dL
Leukocytes, UA: NEGATIVE
Nitrite: NEGATIVE
Protein, ur: NEGATIVE mg/dL
Specific Gravity, Urine: 1.024 (ref 1.005–1.030)
pH: 5 (ref 5.0–8.0)

## 2018-02-23 LAB — CBC
HEMATOCRIT: 37.3 % (ref 36.0–46.0)
Hemoglobin: 12.9 g/dL (ref 12.0–15.0)
MCH: 30.9 pg (ref 26.0–34.0)
MCHC: 34.6 g/dL (ref 30.0–36.0)
MCV: 89.2 fL (ref 78.0–100.0)
Platelets: 176 10*3/uL (ref 150–400)
RBC: 4.18 MIL/uL (ref 3.87–5.11)
RDW: 13.8 % (ref 11.5–15.5)
WBC: 9.6 10*3/uL (ref 4.0–10.5)

## 2018-02-23 LAB — PROTEIN / CREATININE RATIO, URINE
CREATININE, URINE: 252 mg/dL
Protein Creatinine Ratio: 0.03 mg/mg{Cre} (ref 0.00–0.15)
Total Protein, Urine: 8 mg/dL

## 2018-02-23 MED ORDER — ASPIRIN EC 81 MG PO TBEC: 81.0000 mg | DELAYED_RELEASE_TABLET | Freq: Every day | ORAL | 2 refills | Status: DC

## 2018-02-23 MED ORDER — METOCLOPRAMIDE HCL 5 MG/ML IJ SOLN
10.0000 mg | Freq: Once | INTRAMUSCULAR | Status: AC
  Administered 2018-02-23: 10 mg via INTRAVENOUS
  Filled 2018-02-23 (×2): qty 2

## 2018-02-23 MED ORDER — PYRIDOXINE HCL 25 MG PO TABS: 25.0000 mg | ORAL_TABLET | Freq: Three times a day (TID) | ORAL | 0 refills | Status: DC

## 2018-02-23 MED ORDER — DEXAMETHASONE SODIUM PHOSPHATE 10 MG/ML IJ SOLN
10.0000 mg | Freq: Once | INTRAMUSCULAR | Status: AC
  Administered 2018-02-23: 10 mg via INTRAVENOUS
  Filled 2018-02-23: qty 1

## 2018-02-23 MED ORDER — LACTATED RINGERS IV SOLN
INTRAVENOUS | Status: DC
  Administered 2018-02-23: 16:00:00 via INTRAVENOUS

## 2018-02-23 MED ORDER — DIPHENHYDRAMINE HCL 50 MG/ML IJ SOLN
25.0000 mg | Freq: Once | INTRAMUSCULAR | Status: AC
  Administered 2018-02-23: 25 mg via INTRAVENOUS
  Filled 2018-02-23: qty 1

## 2018-02-23 MED ORDER — DOXYLAMINE SUCCINATE (SLEEP) 25 MG PO TABS: 25.0000 mg | ORAL_TABLET | Freq: Three times a day (TID) | ORAL | 0 refills | Status: DC | PRN
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
## 2018-02-23 NOTE — MAU Note (Signed)
Pt presents with c/o H/A for a couple days, unrelieved with Tylenol.  Pt also reports vomtting & dizziness that began today.  Reports she's vomited @ 10 times today. Denies VB or LOF.

## 2018-02-23 NOTE — Discharge Instructions (Signed)

## 2018-03-15 ENCOUNTER — Encounter: Payer: Medicare Other | Admitting: Advanced Practice Midwife

## 2018-03-15 ENCOUNTER — Telehealth: Payer: Self-pay | Admitting: General Practice

## 2018-03-15 DIAGNOSIS — Z3481 Encounter for supervision of other normal pregnancy, first trimester: Secondary | ICD-10-CM

## 2018-03-15 NOTE — Telephone Encounter (Signed)
Left message on VM for patient to give our office a call in regards to appointment scheduled for 03/29/18 at 10:30am.

## 2018-04-07 ENCOUNTER — Other Ambulatory Visit: Payer: Self-pay

## 2018-04-07 ENCOUNTER — Other Ambulatory Visit (HOSPITAL_COMMUNITY)
Admission: RE | Admit: 2018-04-07 | Discharge: 2018-04-07 | Disposition: A | Discharge status: Home / Self Care | Payer: Medicare Other | Source: Ambulatory Visit | Attending: Obstetrics and Gynecology | Admitting: Obstetrics and Gynecology

## 2018-04-07 ENCOUNTER — Encounter: Payer: Self-pay | Admitting: General Practice

## 2018-04-07 ENCOUNTER — Telehealth: Payer: Self-pay | Admitting: Obstetrics and Gynecology

## 2018-04-07 ENCOUNTER — Ambulatory Visit (INDEPENDENT_AMBULATORY_CARE_PROVIDER_SITE_OTHER): Payer: Medicare Other | Admitting: Obstetrics and Gynecology

## 2018-04-07 ENCOUNTER — Encounter: Payer: Self-pay | Admitting: Obstetrics and Gynecology

## 2018-04-07 VITALS — BP 114/79 | HR 89 | Temp 98.2°F | Wt 240.2 lb

## 2018-04-07 DIAGNOSIS — F339 Major depressive disorder, recurrent, unspecified: Secondary | ICD-10-CM

## 2018-04-07 DIAGNOSIS — Z3A24 24 weeks gestation of pregnancy: Secondary | ICD-10-CM | POA: Diagnosis not present

## 2018-04-07 DIAGNOSIS — O0942 Supervision of pregnancy with grand multiparity, second trimester: Secondary | ICD-10-CM

## 2018-04-07 DIAGNOSIS — Z3480 Encounter for supervision of other normal pregnancy, unspecified trimester: Secondary | ICD-10-CM | POA: Insufficient documentation

## 2018-04-07 LAB — POCT URINALYSIS DIPSTICK OB
Bilirubin, UA: NEGATIVE
GLUCOSE, UA: NEGATIVE
LEUKOCYTES UA: NEGATIVE
Nitrite, UA: NEGATIVE
POC,PROTEIN,UA: NEGATIVE
RBC UA: NEGATIVE
UROBILINOGEN UA: 0.2 U/dL
pH, UA: 6 (ref 5.0–8.0)

## 2018-04-07 MED ORDER — PRENATAL VITAMINS 0.8 MG PO TABS: 1.0000 | ORAL_TABLET | Freq: Every day | ORAL | 12 refills | Status: DC

## 2018-04-07 NOTE — Patient Instructions (Signed)
Second Trimester of Pregnancy The second trimester is from week 13 through week 28, month 4 through 6. This is often the time in pregnancy that you feel your best. Often times, morning sickness has lessened or quit. You may have more energy, and you may get hungry more often. Your unborn baby (fetus) is growing rapidly. At the end of the sixth month, he or she is about 9 inches long and weighs about 1 pounds. You will likely feel the baby move (quickening) between 18 and 20 weeks of pregnancy. Follow these instructions at home:  Avoid all smoking, herbs, and alcohol. Avoid drugs not approved by your doctor.  Do not use any tobacco products, including cigarettes, chewing tobacco, and electronic cigarettes. If you need help quitting, ask your doctor. You may get counseling or other support to help you quit.  Only take medicine as told by your doctor. Some medicines are safe and some are not during pregnancy.  Exercise only as told by your doctor. Stop exercising if you start having cramps.  Eat regular, healthy meals.  Wear a good support bra if your breasts are tender.  Do not use hot tubs, steam rooms, or saunas.  Wear your seat belt when driving.  Avoid raw meat, uncooked cheese, and liter boxes and soil used by cats.  Take your prenatal vitamins.  Take 1500-2000 milligrams of calcium daily starting at the 20th week of pregnancy until you deliver your baby.  Try taking medicine that helps you poop (stool softener) as needed, and if your doctor approves. Eat more fiber by eating fresh fruit, vegetables, and whole grains. Drink enough fluids to keep your pee (urine) clear or pale yellow.  Take warm water baths (sitz baths) to soothe pain or discomfort caused by hemorrhoids. Use hemorrhoid cream if your doctor approves.  If you have puffy, bulging veins (varicose veins), wear support hose. Raise (elevate) your feet for 15 minutes, 3-4 times a day. Limit salt in your diet.  Avoid heavy  lifting, wear low heals, and sit up straight.  Rest with your legs raised if you have leg cramps or low back pain.  Visit your dentist if you have not gone during your pregnancy. Use a soft toothbrush to brush your teeth. Be gentle when you floss.  You can have sex (intercourse) unless your doctor tells you not to.  Go to your doctor visits. Get help if:  You feel dizzy.  You have mild cramps or pressure in your lower belly (abdomen).  You have a nagging pain in your belly area.  You continue to feel sick to your stomach (nauseous), throw up (vomit), or have watery poop (diarrhea).  You have bad smelling fluid coming from your vagina.  You have pain with peeing (urination). Get help right away if:  You have a fever.  You are leaking fluid from your vagina.  You have spotting or bleeding from your vagina.  You have severe belly cramping or pain.  You lose or gain weight rapidly.  You have trouble catching your breath and have chest pain.  You notice sudden or extreme puffiness (swelling) of your face, hands, ankles, feet, or legs.  You have not felt the baby move in over an hour.  You have severe headaches that do not go away with medicine.  You have vision changes. This information is not intended to replace advice given to you by your health care provider. Make sure you discuss any questions you have with your health care   provider. Document Released: 09/30/2009 Document Revised: 12/12/2015 Document Reviewed: 09/06/2012 Elsevier Interactive Patient Education  2017 [employer]  
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
## 2018-04-07 NOTE — Telephone Encounter (Signed)
Rx for Generic Makena IM injections with 3 refills called in to pharmacy. Brianna Booth received verbal order and will complete order ASAP. Plans to ship to [facility]; who will call patient to schedule PNV.  Brianna Booth, CNM 04/07/2018 11:33 AM

## 2018-04-08 ENCOUNTER — Telehealth: Payer: Self-pay | Admitting: General Practice

## 2018-04-08 NOTE — Telephone Encounter (Signed)
Left message on VM in regards to US appt scheduled for 04/15/18 at 9:00am.  Asked patient to give our office a call with any questions or concerns.

## 2018-04-09 LAB — CULTURE, OB URINE

## 2018-04-09 LAB — URINE CULTURE, OB REFLEX

## 2018-04-12 LAB — CYTOLOGY - PAP
Bacterial vaginitis: NEGATIVE
CANDIDA VAGINITIS: NEGATIVE
CHLAMYDIA, DNA PROBE: NEGATIVE
Diagnosis: NEGATIVE
HPV (WINDOPATH): NOT DETECTED
HPV: NOT DETECTED
Neisseria Gonorrhea: NEGATIVE
TRICH (WINDOWPATH): NEGATIVE

## 2018-04-14 ENCOUNTER — Telehealth: Payer: Self-pay | Admitting: *Deleted

## 2018-04-14 NOTE — Telephone Encounter (Signed)
Noted Generic 17p arrived. Called patient and left message medication arrived and we need her to call us asap to schedule first injection. Will also send MyChart message.

## 2018-04-15 ENCOUNTER — Ambulatory Visit (INDEPENDENT_AMBULATORY_CARE_PROVIDER_SITE_OTHER): Payer: Medicare Other | Admitting: *Deleted

## 2018-04-15 ENCOUNTER — Ambulatory Visit (HOSPITAL_COMMUNITY)
Admission: RE | Admit: 2018-04-15 | Discharge: 2018-04-15 | Disposition: A | Discharge status: Home / Self Care | Payer: Medicare Other | Source: Ambulatory Visit | Attending: Obstetrics and Gynecology | Admitting: Obstetrics and Gynecology

## 2018-04-15 ENCOUNTER — Encounter (HOSPITAL_COMMUNITY): Payer: Self-pay

## 2018-04-15 ENCOUNTER — Other Ambulatory Visit (HOSPITAL_COMMUNITY): Payer: Self-pay | Admitting: *Deleted

## 2018-04-15 VITALS — BP 117/75 | HR 79 | Ht 67.0 in | Wt 237.7 lb

## 2018-04-15 DIAGNOSIS — J45909 Unspecified asthma, uncomplicated: Secondary | ICD-10-CM | POA: Insufficient documentation

## 2018-04-15 DIAGNOSIS — O09212 Supervision of pregnancy with history of pre-term labor, second trimester: Secondary | ICD-10-CM | POA: Diagnosis present

## 2018-04-15 DIAGNOSIS — O0932 Supervision of pregnancy with insufficient antenatal care, second trimester: Secondary | ICD-10-CM | POA: Diagnosis not present

## 2018-04-15 DIAGNOSIS — O0942 Supervision of pregnancy with grand multiparity, second trimester: Secondary | ICD-10-CM | POA: Diagnosis present

## 2018-04-15 DIAGNOSIS — O132 Gestational [pregnancy-induced] hypertension without significant proteinuria, second trimester: Secondary | ICD-10-CM | POA: Diagnosis not present

## 2018-04-15 DIAGNOSIS — E669 Obesity, unspecified: Secondary | ICD-10-CM | POA: Diagnosis not present

## 2018-04-15 DIAGNOSIS — Z3689 Encounter for other specified antenatal screening: Secondary | ICD-10-CM | POA: Diagnosis present

## 2018-04-15 DIAGNOSIS — O99512 Diseases of the respiratory system complicating pregnancy, second trimester: Secondary | ICD-10-CM | POA: Diagnosis not present

## 2018-04-15 DIAGNOSIS — O99212 Obesity complicating pregnancy, second trimester: Secondary | ICD-10-CM | POA: Diagnosis not present

## 2018-04-15 DIAGNOSIS — Z363 Encounter for antenatal screening for malformations: Secondary | ICD-10-CM | POA: Diagnosis not present

## 2018-04-15 DIAGNOSIS — Z8751 Personal history of pre-term labor: Secondary | ICD-10-CM

## 2018-04-15 DIAGNOSIS — Z3A24 24 weeks gestation of pregnancy: Secondary | ICD-10-CM

## 2018-04-15 DIAGNOSIS — Z362 Encounter for other antenatal screening follow-up: Secondary | ICD-10-CM

## 2018-04-15 MED ORDER — HYDROXYPROGESTERONE CAPROATE 250 MG/ML IM OIL
250.0000 mg | TOPICAL_OIL | INTRAMUSCULAR | Status: DC
  Administered 2018-04-15 – 2018-05-19 (×3): 250 mg via INTRAMUSCULAR

## 2018-04-15 NOTE — Progress Notes (Signed)
Chart reviewed for nurse visit. Agree with plan of care.   Neill, Caroline M, CNM 04/15/2018 12:59 PM   

## 2018-04-15 NOTE — Progress Notes (Signed)
Willy Eddy here for 17-P  Injection.  Injection administered without complication.  Pt tolerated well. Patient will return in one week for next injection.  Osvaldo Human, RN 04/15/2018  11:12 AM

## 2018-04-26 ENCOUNTER — Ambulatory Visit (INDEPENDENT_AMBULATORY_CARE_PROVIDER_SITE_OTHER): Payer: Medicare Other | Admitting: General Practice

## 2018-04-26 VITALS — BP 131/74 | HR 110 | Ht 69.0 in | Wt 241.0 lb

## 2018-04-26 DIAGNOSIS — O09212 Supervision of pregnancy with history of pre-term labor, second trimester: Secondary | ICD-10-CM | POA: Diagnosis not present

## 2018-04-26 DIAGNOSIS — Z8751 Personal history of pre-term labor: Secondary | ICD-10-CM

## 2018-04-26 NOTE — Progress Notes (Signed)
Brianna Booth here for 17-P  Injection.  Injection administered without complication. Patient will return in one week for next injection.  Marylynn Pearson, RN 04/26/2018  3:45 PM

## 2018-04-28 NOTE — Progress Notes (Signed)
I reviewed the note and agree with the nursing assessment and plan.   Germany Chelf, CNM 10/15/2017 10:32 AM   

## 2018-05-03 ENCOUNTER — Other Ambulatory Visit: Payer: Self-pay | Admitting: *Deleted

## 2018-05-03 DIAGNOSIS — O0942 Supervision of pregnancy with grand multiparity, second trimester: Secondary | ICD-10-CM

## 2018-05-05 ENCOUNTER — Other Ambulatory Visit: Payer: Medicare Other

## 2018-05-05 ENCOUNTER — Encounter: Payer: Self-pay | Admitting: Family Medicine

## 2018-05-05 ENCOUNTER — Encounter: Payer: Medicare Other | Admitting: Family Medicine

## 2018-05-05 NOTE — Progress Notes (Signed)
Patient did not keep appointment today. She will be called to reschedule.  

## 2018-05-05 NOTE — BH Specialist Note (Deleted)
Integrated Behavioral Health Initial Visit  MRN: [id number] Name: Breahna Boylen  Number of Integrated Behavioral Health Clinician visits:: 1/6 Session Start time: ***  Session End time: *** Total time: {IBH Total Time:21014050}  Type of Service: Integrated Behavioral Health- Individual/Family Interpretor:{yes [id number]} Interpretor Name and Language: ***   Warm Hand Off Completed.       SUBJECTIVE: Jentry Mcqueary is a 32 y.o. female accompanied by {CHL AMB ACCOMPANIED HY:[id number]} Patient was referred by Tinnie Gens, MD for depression Patient reports the following symptoms/concerns: *** Duration of problem: ***; Severity of problem: {Mild/Moderate/Severe:20260}  OBJECTIVE: Mood: {BHH MOOD:22306} and Affect: {BHH AFFECT:22307} Risk of harm to self or others: {CHL AMB BH Suicide Current Mental Status:21022748}  LIFE CONTEXT: Family and Social: *** School/Work: *** Self-Care: *** Life Changes: Current pregnancy ***  GOALS ADDRESSED: Patient will: 1. Reduce symptoms of: {IBH Symptoms:21014056} 2. Increase knowledge and/or ability of: {IBH Patient Tools:21014057}  3. Demonstrate ability to: {IBH Goals:21014053}  INTERVENTIONS: Interventions utilized: {IBH Interventions:21014054}  Standardized Assessments completed: GAD-7 and PHQ 9 ***  ASSESSMENT: Patient currently experiencing ***.   Patient may benefit from psychoeducation and brief therapeutic interventions regarding coping with symptoms of ***   PLAN: 1. Follow up with behavioral health clinician on : *** 2. Behavioral recommendations: *** 3. Referral(s): {IBH Referrals:21014055} 4. "From scale of 1-10, how likely are you to follow plan?": ***  Valetta Close Kitt Ledet, LCSW  Depression screen [facility] 2/9 04/07/2018  Decreased Interest 2  Down, Depressed, Hopeless 2  PHQ - 2 Score 4  Altered sleeping 1  Tired, decreased energy 3  Change in appetite 2  Feeling bad or failure about yourself  1  Trouble concentrating  1  Moving slowly or fidgety/restless 1  Suicidal thoughts 1  PHQ-9 Score 14  Difficult doing work/chores Extremely dIfficult   GAD 7 : Generalized Anxiety Score 04/07/2018  Nervous, Anxious, on Edge 2  Control/stop worrying 2  Worry too much - different things 2  Trouble relaxing 2  Restless 2  Easily annoyed or irritable 2  Afraid - awful might happen 0  Total GAD 7 Score 12    ***

## 2018-05-13 ENCOUNTER — Encounter (HOSPITAL_COMMUNITY): Payer: Self-pay

## 2018-05-13 ENCOUNTER — Ambulatory Visit (HOSPITAL_COMMUNITY)
Admission: RE | Admit: 2018-05-13 | Discharge: 2018-05-13 | Disposition: A | Discharge status: Home / Self Care | Payer: Medicare Other | Source: Ambulatory Visit | Attending: Obstetrics and Gynecology | Admitting: Obstetrics and Gynecology

## 2018-05-13 DIAGNOSIS — Z3A28 28 weeks gestation of pregnancy: Secondary | ICD-10-CM

## 2018-05-13 DIAGNOSIS — O0933 Supervision of pregnancy with insufficient antenatal care, third trimester: Secondary | ICD-10-CM | POA: Diagnosis not present

## 2018-05-13 DIAGNOSIS — O99213 Obesity complicating pregnancy, third trimester: Secondary | ICD-10-CM | POA: Diagnosis not present

## 2018-05-13 DIAGNOSIS — Z3480 Encounter for supervision of other normal pregnancy, unspecified trimester: Secondary | ICD-10-CM

## 2018-05-13 DIAGNOSIS — O09213 Supervision of pregnancy with history of pre-term labor, third trimester: Secondary | ICD-10-CM

## 2018-05-13 DIAGNOSIS — O99513 Diseases of the respiratory system complicating pregnancy, third trimester: Secondary | ICD-10-CM | POA: Diagnosis not present

## 2018-05-13 DIAGNOSIS — O09293 Supervision of pregnancy with other poor reproductive or obstetric history, third trimester: Secondary | ICD-10-CM | POA: Diagnosis not present

## 2018-05-13 DIAGNOSIS — J45909 Unspecified asthma, uncomplicated: Secondary | ICD-10-CM | POA: Diagnosis not present

## 2018-05-13 DIAGNOSIS — O133 Gestational [pregnancy-induced] hypertension without significant proteinuria, third trimester: Secondary | ICD-10-CM

## 2018-05-13 DIAGNOSIS — Z362 Encounter for other antenatal screening follow-up: Secondary | ICD-10-CM

## 2018-05-16 ENCOUNTER — Other Ambulatory Visit (HOSPITAL_COMMUNITY): Payer: Self-pay | Admitting: *Deleted

## 2018-05-16 DIAGNOSIS — O133 Gestational [pregnancy-induced] hypertension without significant proteinuria, third trimester: Secondary | ICD-10-CM

## 2018-05-19 ENCOUNTER — Ambulatory Visit (INDEPENDENT_AMBULATORY_CARE_PROVIDER_SITE_OTHER): Payer: Medicare Other | Admitting: *Deleted

## 2018-05-19 ENCOUNTER — Encounter: Payer: Medicaid Other | Admitting: Obstetrics and Gynecology

## 2018-05-19 ENCOUNTER — Other Ambulatory Visit: Payer: Medicaid Other

## 2018-05-19 VITALS — BP 129/71 | HR 89 | Wt 243.2 lb

## 2018-05-19 DIAGNOSIS — O09212 Supervision of pregnancy with history of pre-term labor, second trimester: Secondary | ICD-10-CM | POA: Diagnosis not present

## 2018-05-19 DIAGNOSIS — O0942 Supervision of pregnancy with grand multiparity, second trimester: Secondary | ICD-10-CM

## 2018-05-19 DIAGNOSIS — Z8751 Personal history of pre-term labor: Secondary | ICD-10-CM

## 2018-05-26 ENCOUNTER — Encounter: Payer: Medicaid Other | Admitting: Obstetrics & Gynecology

## 2018-05-30 ENCOUNTER — Encounter: Payer: Medicaid Other | Admitting: Obstetrics and Gynecology

## 2018-06-09 ENCOUNTER — Encounter (HOSPITAL_COMMUNITY): Payer: Self-pay | Admitting: *Deleted

## 2018-06-09 ENCOUNTER — Inpatient Hospital Stay (HOSPITAL_COMMUNITY)
Admission: AD | Admit: 2018-06-09 | Discharge: 2018-06-10 | Disposition: A | Discharge status: Home / Self Care | Payer: Medicare Other | Source: Ambulatory Visit | Attending: Obstetrics and Gynecology | Admitting: Obstetrics and Gynecology

## 2018-06-09 DIAGNOSIS — F419 Anxiety disorder, unspecified: Secondary | ICD-10-CM | POA: Diagnosis not present

## 2018-06-09 DIAGNOSIS — Z3A32 32 weeks gestation of pregnancy: Secondary | ICD-10-CM | POA: Diagnosis not present

## 2018-06-09 DIAGNOSIS — R51 Headache: Secondary | ICD-10-CM | POA: Insufficient documentation

## 2018-06-09 DIAGNOSIS — O26893 Other specified pregnancy related conditions, third trimester: Secondary | ICD-10-CM | POA: Diagnosis not present

## 2018-06-09 DIAGNOSIS — O9989 Other specified diseases and conditions complicating pregnancy, childbirth and the puerperium: Secondary | ICD-10-CM

## 2018-06-09 DIAGNOSIS — N858 Other specified noninflammatory disorders of uterus: Secondary | ICD-10-CM

## 2018-06-09 DIAGNOSIS — Z79899 Other long term (current) drug therapy: Secondary | ICD-10-CM | POA: Insufficient documentation

## 2018-06-09 DIAGNOSIS — Z87891 Personal history of nicotine dependence: Secondary | ICD-10-CM | POA: Diagnosis not present

## 2018-06-09 DIAGNOSIS — F329 Major depressive disorder, single episode, unspecified: Secondary | ICD-10-CM | POA: Insufficient documentation

## 2018-06-09 DIAGNOSIS — Z7982 Long term (current) use of aspirin: Secondary | ICD-10-CM | POA: Insufficient documentation

## 2018-06-09 DIAGNOSIS — M549 Dorsalgia, unspecified: Secondary | ICD-10-CM | POA: Diagnosis present

## 2018-06-09 DIAGNOSIS — O99343 Other mental disorders complicating pregnancy, third trimester: Secondary | ICD-10-CM | POA: Insufficient documentation

## 2018-06-09 DIAGNOSIS — R109 Unspecified abdominal pain: Secondary | ICD-10-CM | POA: Diagnosis present

## 2018-06-09 DIAGNOSIS — N859 Noninflammatory disorder of uterus, unspecified: Secondary | ICD-10-CM

## 2018-06-09 DIAGNOSIS — R519 Headache, unspecified: Secondary | ICD-10-CM

## 2018-06-09 DIAGNOSIS — O99891 Other specified diseases and conditions complicating pregnancy: Secondary | ICD-10-CM

## 2018-06-09 LAB — URINALYSIS, ROUTINE W REFLEX MICROSCOPIC
Bilirubin Urine: NEGATIVE
Glucose, UA: NEGATIVE mg/dL
Hgb urine dipstick: NEGATIVE
Ketones, ur: 5 mg/dL — AB
Nitrite: NEGATIVE
PH: 5 (ref 5.0–8.0)
Protein, ur: 30 mg/dL — AB
SPECIFIC GRAVITY, URINE: 1.035 — AB (ref 1.005–1.030)

## 2018-06-09 NOTE — MAU Note (Addendum)
CTX all day but hasn't been timing them.  No LOF/VB.  +FM.  States she has had 4 preterm deliveries.  Receives 17p shots but hasn't had one since 10/8 due to not having a ride.

## 2018-06-09 NOTE — MAU Provider Note (Signed)
Chief Complaint:  Abdominal Pain and Back Pain   First Provider Initiated Contact with Patient 06/09/18 2351     HPI: Brianna Booth is a 32 y.o. [id number] at 2932w5dwho presents to maternity admissions reporting preterm contractions.  Has missed last few 17P shots at clinic. Actually has only been to one prenatal visit.  Has made it to some visits for shots and ultrasounds. . She reports good fetal movement, denies LOF, vaginal bleeding, vaginal itching/burning, urinary symptoms, h/a, dizziness, n/v, diarrhea, constipation or fever/chills.  She denies headache, visual changes or RUQ abdominal pain.  Has been itching since October.  Did not tell anyone because she has not been back to office since September.   Abdominal Pain  This is a recurrent problem. The current episode started today. The onset quality is gradual. The problem occurs intermittently. The problem has been waxing and waning. The pain is mild. The quality of the pain is cramping. The abdominal pain does not radiate. Pertinent negatives include no constipation, diarrhea, dysuria or fever. Nothing aggravates the pain. The pain is relieved by nothing. She has tried nothing for the symptoms.  Back Pain  This is a recurrent problem. The current episode started today. The problem has been waxing and waning since onset. The pain is present in the lumbar spine. The pain is mild. Associated symptoms include abdominal pain. Pertinent negatives include no dysuria or fever. She has tried nothing for the symptoms.   RN note: CTX all day but hasn't been timing them.  No LOF/VB.  +FM.  States she has had 4 preterm deliveries.  Receives 17p shots but hasn't had one since 10/8 due to not having a ride.   Past Medical History: Past Medical History:  Diagnosis Date  . Anxiety   . Asthma   . Depression   . Headache   . Pregnancy induced hypertension   . Preterm labor     Past obstetric history: OB History  Gravida Para Term Preterm AB Living   31 7 3 4 5 7   SAB TAB Ectopic Multiple Live Births  3 2     7     # Outcome Date GA Lbr Len/2nd Weight Sex Delivery Anes PTL Lv  13 Current           12 Preterm 05/26/17    M    LIV  11 Preterm 02/26/14    F    LIV  10 Term 09/15/11    M Vag-Spont   LIV  9 Preterm 11/13/08    F Vag-Spont  Y LIV  8 Term 05/09/07 3150w0d   M Vag-Spont  N LIV  7 Term 05/22/05 3141w3d   F Vag-Spont  N LIV  6 Preterm 07/19/03 317w0d   F Vag-Spont  Y LIV  5 TAB           4 TAB           3 SAB           2 SAB           1 SAB             Past Surgical History: Past Surgical History:  Procedure Laterality Date  . DILATION AND CURETTAGE OF UTERUS      Family History: Family History  Problem Relation Age of Onset  . Hypertension Father     Social History: Social History   Tobacco Use  . Smoking status: Former Smoker    Last attempt to quit: 12/12/2017  Years since quitting: 0.4  . Smokeless tobacco: Never Used  Substance Use Topics  . Alcohol use: Never    Frequency: Never  . Drug use: Never    Allergies: No Known Allergies  Meds:  Facility-Administered Medications Prior to Admission  Medication Dose Route Frequency Provider Last Rate Last Dose  . hydroxyprogesterone caproate (MAKENA) 250 mg/mL injection 250 mg  250 mg Intramuscular Weekly Raelyn Mora, CNM   250 mg at 05/19/18 1319   Medications Prior to Admission  Medication Sig Dispense Refill Last Dose  . albuterol (PROVENTIL HFA;VENTOLIN HFA) 108 (90 Base) MCG/ACT inhaler Inhale 1-2 puffs into the lungs every 6 (six) hours as needed for wheezing or shortness of breath.   Past Week at Unknown time  . Prenatal Multivit-Min-Fe-FA (PRENATAL VITAMINS) 0.8 MG tablet Take 1 tablet by mouth daily. 30 tablet 12 06/08/2018 at Unknown time  . acetaminophen (TYLENOL) 500 MG tablet Take 1,000 mg by mouth every 6 (six) hours as needed for moderate pain.   Taking  . alprazolam (XANAX) 2 MG tablet Take 2 mg by mouth 4 (four) times daily as needed  for anxiety.   More than a month at Unknown time  . ARIPiprazole (ABILIFY) 20 MG tablet Take 20 mg by mouth daily.   More than a month at Unknown time  . aspirin EC 81 MG tablet Take 1 tablet (81 mg total) by mouth daily. 30 tablet 2 Taking  . busPIRone (BUSPAR) 30 MG tablet Take 30 mg by mouth at bedtime.   More than a month at Unknown time  . cyclobenzaprine (FLEXERIL) 10 MG tablet Take 1 tablet (10 mg total) by mouth 2 (two) times daily as needed for muscle spasms. 20 tablet 0 Taking  . doxylamine, Sleep, (UNISOM) 25 MG tablet Take 1 tablet (25 mg total) by mouth every 8 (eight) hours as needed. 30 tablet 0 Taking  . hydrOXYzine (VISTARIL) 50 MG capsule Take 100 mg by mouth 2 (two) times daily.   More than a month at Unknown time  . lamoTRIgine (LAMICTAL) 200 MG tablet Take 200 mg by mouth 3 (three) times daily.   More than a month at Unknown time  . montelukast (SINGULAIR) 10 MG tablet Take 10 mg by mouth at bedtime.   More than a month at Unknown time  . pyridOXINE (VITAMIN B-6) 25 MG tablet Take 1 tablet (25 mg total) by mouth every 8 (eight) hours. 30 tablet 0 Taking  . zolpidem (AMBIEN) 10 MG tablet Take 10 mg by mouth at bedtime as needed for sleep.   More than a month at Unknown time    I have reviewed patient's Past Medical Hx, Surgical Hx, Family Hx, Social Hx, medications and allergies.   ROS:  Review of Systems  Constitutional: Negative for fever.  Gastrointestinal: Positive for abdominal pain. Negative for constipation and diarrhea.  Genitourinary: Negative for dysuria.  Musculoskeletal: Positive for back pain.   Other systems negative  Physical Exam   Patient Vitals for the past 24 hrs:  BP Temp Pulse Resp SpO2 Height Weight  06/09/18 2328 115/83 98.1 F (36.7 C) 92 20 98 % 5\' 7"  (1.702 m) 111.9 kg   Constitutional: Well-developed, well-nourished female in no acute distress.  Cardiovascular: normal rate and rhythm Respiratory: normal effort, clear to auscultation  bilaterally GI: Abd soft, non-tender, gravid appropriate for gestational age.   No rebound or guarding. MS: Extremities nontender, no edema, normal ROM Neurologic: Alert and oriented x 4.  GU: Neg CVAT.  PELVIC EXAM:  cervix long and closed except external os open, FFn sent   FHT:  Baseline 140 , moderate variability, accelerations present, no decelerations Contractions:  Irregular and occasional   Labs: Results for orders placed or performed during the hospital encounter of 06/09/18 (from the past 24 hour(s))  Urinalysis, Routine w reflex microscopic     Status: Abnormal   Collection Time: 06/09/18 11:48 PM  Result Value Ref Range   Color, Urine YELLOW YELLOW   APPearance HAZY (A) CLEAR   Specific Gravity, Urine 1.035 (H) 1.005 - 1.030   pH 5.0 5.0 - 8.0   Glucose, UA NEGATIVE NEGATIVE mg/dL   Hgb urine dipstick NEGATIVE NEGATIVE   Bilirubin Urine NEGATIVE NEGATIVE   Ketones, ur 5 (A) NEGATIVE mg/dL   Protein, ur 30 (A) NEGATIVE mg/dL   Nitrite NEGATIVE NEGATIVE   Leukocytes, UA TRACE (A) NEGATIVE   RBC / HPF 0-5 0 - 5 RBC/hpf   WBC, UA 0-5 0 - 5 WBC/hpf   Bacteria, UA FEW (A) NONE SEEN   Squamous Epithelial / LPF 11-20 0 - 5   Mucus PRESENT   Fetal fibronectin     Status: None   Collection Time: 06/10/18 12:01 AM  Result Value Ref Range   Fetal Fibronectin NEGATIVE NEGATIVE  Comprehensive metabolic panel     Status: Abnormal   Collection Time: 06/10/18  1:10 AM  Result Value Ref Range   Sodium 135 135 - 145 mmol/L   Potassium 3.5 3.5 - 5.1 mmol/L   Chloride 106 98 - 111 mmol/L   CO2 20 (L) 22 - 32 mmol/L   Glucose, Bld 117 (H) 70 - 99 mg/dL   BUN 10 6 - 20 mg/dL   Creatinine, Ser 1.61 0.44 - 1.00 mg/dL   Calcium 8.8 (L) 8.9 - 10.3 mg/dL   Total Protein 6.8 6.5 - 8.1 g/dL   Albumin 3.0 (L) 3.5 - 5.0 g/dL   AST 14 (L) 15 - 41 U/L   ALT 10 0 - 44 U/L   Alkaline Phosphatase 80 38 - 126 U/L   Total Bilirubin 0.4 0.3 - 1.2 mg/dL   GFR calc non Af Amer >60 >60 mL/min    GFR calc Af Amer >60 >60 mL/min   Anion gap 9 5 - 15    --/--/O POS, O POS (08/05 1727)  Imaging:    MAU Course/MDM: I have ordered labs and reviewed results. Labs are normal  Will order bile acids to eval for cholestasis NST reviewed, reactive.  Treatments in MAU included EFM, FFn, Flexeril (which helped back), and Fioricet for headache (which helped).    Assessment: Single intrauterine pregnancy at [redacted]w[redacted]d Uterine irritability Back pain Headache  Plan: Discharge home Preterm Labor precautions and fetal kick counts Rx Flexeril for back pain Rx fioricet forheadaches Discussed importance of coming to PN visits.  Reinforced need to do glucose testing ASAP Follow up in Office for prenatal visits and recheck  Encouraged to return here or to other Urgent Care/ED if she develops worsening of symptoms, increase in pain, fever, or other concerning symptoms.   Pt stable at time of discharge.  Wynelle Bourgeois CNM, MSN Certified Nurse-Midwife 06/09/2018 11:53 PM

## 2018-06-10 ENCOUNTER — Encounter (HOSPITAL_COMMUNITY): Payer: Self-pay

## 2018-06-10 ENCOUNTER — Ambulatory Visit (HOSPITAL_BASED_OUTPATIENT_CLINIC_OR_DEPARTMENT_OTHER)
Admission: RE | Admit: 2018-06-10 | Discharge: 2018-06-10 | Disposition: A | Discharge status: Home / Self Care | Payer: Medicare Other | Source: Ambulatory Visit | Attending: Obstetrics & Gynecology | Admitting: Obstetrics & Gynecology

## 2018-06-10 DIAGNOSIS — O09213 Supervision of pregnancy with history of pre-term labor, third trimester: Secondary | ICD-10-CM | POA: Diagnosis not present

## 2018-06-10 DIAGNOSIS — Z3480 Encounter for supervision of other normal pregnancy, unspecified trimester: Secondary | ICD-10-CM

## 2018-06-10 DIAGNOSIS — Z3A32 32 weeks gestation of pregnancy: Secondary | ICD-10-CM | POA: Insufficient documentation

## 2018-06-10 DIAGNOSIS — O09293 Supervision of pregnancy with other poor reproductive or obstetric history, third trimester: Secondary | ICD-10-CM

## 2018-06-10 DIAGNOSIS — O0933 Supervision of pregnancy with insufficient antenatal care, third trimester: Secondary | ICD-10-CM | POA: Diagnosis not present

## 2018-06-10 DIAGNOSIS — O139 Gestational [pregnancy-induced] hypertension without significant proteinuria, unspecified trimester: Secondary | ICD-10-CM

## 2018-06-10 DIAGNOSIS — O133 Gestational [pregnancy-induced] hypertension without significant proteinuria, third trimester: Secondary | ICD-10-CM | POA: Insufficient documentation

## 2018-06-10 DIAGNOSIS — O26893 Other specified pregnancy related conditions, third trimester: Secondary | ICD-10-CM | POA: Diagnosis not present

## 2018-06-10 LAB — COMPREHENSIVE METABOLIC PANEL
ALT: 10 U/L (ref 0–44)
AST: 14 U/L — AB (ref 15–41)
Albumin: 3 g/dL — ABNORMAL LOW (ref 3.5–5.0)
Alkaline Phosphatase: 80 U/L (ref 38–126)
Anion gap: 9 (ref 5–15)
BUN: 10 mg/dL (ref 6–20)
CHLORIDE: 106 mmol/L (ref 98–111)
CO2: 20 mmol/L — ABNORMAL LOW (ref 22–32)
Calcium: 8.8 mg/dL — ABNORMAL LOW (ref 8.9–10.3)
Creatinine, Ser: 0.44 mg/dL (ref 0.44–1.00)
Glucose, Bld: 117 mg/dL — ABNORMAL HIGH (ref 70–99)
POTASSIUM: 3.5 mmol/L (ref 3.5–5.1)
Sodium: 135 mmol/L (ref 135–145)
Total Bilirubin: 0.4 mg/dL (ref 0.3–1.2)
Total Protein: 6.8 g/dL (ref 6.5–8.1)

## 2018-06-10 LAB — FETAL FIBRONECTIN: FETAL FIBRONECTIN: NEGATIVE

## 2018-06-10 MED ORDER — BUTALBITAL-APAP-CAFFEINE 50-325-40 MG PO CAPS: 1.0000 | ORAL_CAPSULE | Freq: Four times a day (QID) | ORAL | 0 refills | Status: DC | PRN

## 2018-06-10 MED ORDER — CYCLOBENZAPRINE HCL 10 MG PO TABS
10.0000 mg | ORAL_TABLET | Freq: Once | ORAL | Status: AC
  Administered 2018-06-10: 10 mg via ORAL
  Filled 2018-06-10: qty 1

## 2018-06-10 MED ORDER — CYCLOBENZAPRINE HCL 5 MG PO TABS: 5.0000 mg | ORAL_TABLET | Freq: Once | ORAL | Status: DC

## 2018-06-10 MED ORDER — BUTALBITAL-APAP-CAFFEINE 50-325-40 MG PO TABS
1.0000 | ORAL_TABLET | Freq: Once | ORAL | Status: AC
  Administered 2018-06-10: 1 via ORAL
  Filled 2018-06-10: qty 1

## 2018-06-10 NOTE — Discharge Instructions (Signed)
Preterm Labor and Birth Information Pregnancy normally lasts 39-41 weeks. Preterm labor is when labor starts early. It starts before you have been pregnant for 37 whole weeks. What are the risk factors for preterm labor? Preterm labor is more likely to occur in women who:  Have an infection while pregnant.  Have a cervix that is short.  Have gone into preterm labor before.  Have had surgery on their cervix.  Are younger than age 31.  Are older than age 85.  Are African American.  Are pregnant with two or more babies.  Take street drugs while pregnant.  Smoke while pregnant.  Do not gain enough weight while pregnant.  Got pregnant right after another pregnancy.  What are the symptoms of preterm labor? Symptoms of preterm labor include:  Cramps. The cramps may feel like the cramps some women get during their period. The cramps may happen with watery poop (diarrhea).  Pain in the belly (abdomen).  Pain in the lower back.  Regular contractions or tightening. It may feel like your belly is getting tighter.  Pressure in the lower belly that seems to get stronger.  More fluid (discharge) leaking from the vagina. The fluid may be watery or bloody.  Water breaking.  Why is it important to notice signs of preterm labor? Babies who are born early may not be fully developed. They have a higher chance for:  Long-term heart problems.  Long-term lung problems.  Trouble controlling body systems, like breathing.  Bleeding in the brain.  A condition called cerebral palsy.  Learning difficulties.  Death.  These risks are highest for babies who are born before 34 weeks of pregnancy. How is preterm labor treated? Treatment depends on:  How long you were pregnant.  Your condition.  The health of your baby.  Treatment may involve:  Having a stitch (suture) placed in your cervix. When you give birth, your cervix opens so the baby can come out. The stitch keeps the  cervix from opening too soon.  Staying at the hospital.  Taking or getting medicines, such as: ? Hormone medicines. ? Medicines to stop contractions. ? Medicines to help the babys lungs develop. ? Medicines to prevent your baby from having cerebral palsy.  What should I do if I am in preterm labor? If you think you are going into labor too soon, call your doctor right away. How can I prevent preterm labor?  Do not use any tobacco products. ? Examples of these are cigarettes, chewing tobacco, and e-cigarettes. ? If you need help quitting, ask your doctor.  Do not use street drugs.  Do not use any medicines unless you ask your doctor if they are safe for you.  Talk with your doctor before taking any herbal supplements.  Make sure you gain enough weight.  Watch for infection. If you think you might have an infection, get it checked right away.  If you have gone into preterm labor before, tell your doctor. This information is not intended to replace advice given to you by your health care provider. Make sure you discuss any questions you have with your health care provider. Document Released: 10/02/2008 Document Revised: 12/17/2015 Document Reviewed: 11/27/2015 Elsevier Interactive Patient Education  2018 [employer]. Back Pain, Adult Many adults have back pain from time to time. Common causes of back pain include:  A strained muscle or ligament.  Wear and tear (degeneration) of the spinal disks.  Arthritis.  A hit to the back.  Back pain  can be short-lived (acute) or last a long time (chronic). A physical exam, lab tests, and imaging studies may be done to find the cause of your pain. Follow these instructions at home: Managing pain and stiffness  Take over-the-counter and prescription medicines only as told by your health care provider.  If directed, apply heat to the affected area as often as told by your health care provider. Use the heat source that your health  care provider recommends, such as a moist heat pack or a heating pad. ? Place a towel between your skin and the heat source. ? Leave the heat on for 20-30 minutes. ? Remove the heat if your skin turns bright red. This is especially important if you are unable to feel pain, heat, or cold. You have a greater risk of getting burned.  If directed, apply ice to the injured area: ? Put ice in a plastic bag. ? Place a towel between your skin and the bag. ? Leave the ice on for 20 minutes, 2-3 times a day for the first 2-3 days. Activity  Do not stay in bed. Resting more than 1-2 days can delay your recovery.  Take short walks on even surfaces as soon as you are able. Try to increase the length of time you walk each day.  Do not sit, drive, or stand in one place for more than 30 minutes at a time. Sitting or standing for long periods of time can put stress on your back.  Use proper lifting techniques. When you bend and lift, use positions that put less stress on your back: ? [place] your knees. ? Keep the load close to your body. ? Avoid twisting.  Exercise regularly as told by your health care provider. Exercising will help your back heal faster. This also helps prevent back injuries by keeping muscles strong and flexible.  Your health care provider may recommend that you see a physical therapist. This person can help you come up with a safe exercise program. Do any exercises as told by your physical therapist. Lifestyle  Maintain a healthy weight. Extra weight puts stress on your back and makes it difficult to have good posture.  Avoid activities or situations that make you feel anxious or stressed. Learn ways to manage anxiety and stress. One way to manage stress is through exercise. Stress and anxiety increase muscle tension and can make back pain worse. General instructions  Sleep on a firm mattress in a comfortable position. Try lying on your side with your knees slightly bent. If you lie  on your back, put a pillow under your knees.  Follow your treatment plan as told by your health care provider. This may include: ? Cognitive or behavioral therapy. ? Acupuncture or massage therapy. ? Meditation or yoga. Contact a health care provider if:  You have pain that is not relieved with rest or medicine.  You have increasing pain going down into your legs or buttocks.  Your pain does not improve in 2 weeks.  You have pain at night.  You lose weight.  You have a fever or chills. Get help right away if:  You develop new bowel or bladder control problems.  You have unusual weakness or numbness in your arms or legs.  You develop nausea or vomiting.  You develop abdominal pain.  You feel faint. Summary  Many adults have back pain from time to time. A physical exam, lab tests, and imaging studies may be done to find the cause  of your pain.  Use proper lifting techniques. When you bend and lift, use positions that put less stress on your back.  Take over-the-counter and prescription medicines and apply heat or ice as directed by your health care provider. This information is not intended to replace advice given to you by your health care provider. Make sure you discuss any questions you have with your health care provider. Document Released: 07/06/2005 Document Revised: 08/10/2016 Document Reviewed: 08/10/2016 Elsevier Interactive Patient Education  2018 Elsevier Inc. General Headache Without Cause A headache is pain or discomfort felt around the head or neck area. There are many causes and types of headaches. In some cases, the cause may not be found. Follow these instructions at home: Managing pain  Take over-the-counter and prescription medicines only as told by your doctor.  Lie down in a dark, quiet room when you have a headache.  If directed, apply ice to the head and neck area: ? Put ice in a plastic bag. ? Place a towel between your skin and the  bag. ? Leave the ice on for 20 minutes, 2-3 times per day.  Use a heating pad or hot shower to apply heat to the head and neck area as told by your doctor.  Keep lights dim if bright lights bother you or make your headaches worse. Eating and drinking  Eat meals on a regular schedule.  Lessen how much alcohol you drink.  Lessen how much caffeine you drink, or stop drinking caffeine. General instructions  Keep all follow-up visits as told by your doctor. This is important.  Keep a journal to find out if certain things bring on headaches. For example, write down: ? What you eat and drink. ? How much sleep you get. ? Any change to your diet or medicines.  Relax by getting a massage or doing other relaxing activities.  Lessen stress.  Sit up straight. Do not tighten (tense) your muscles.  Do not use tobacco products. This includes cigarettes, chewing tobacco, or e-cigarettes. If you need help quitting, ask your doctor.  Exercise regularly as told by your doctor.  Get enough sleep. This often means 7-9 hours of sleep. Contact a doctor if:  Your symptoms are not helped by medicine.  You have a headache that feels different than the other headaches.  You feel sick to your stomach (nauseous) or you throw up (vomit).  You have a fever. Get help right away if:  Your headache becomes really bad.  You keep throwing up.  You have a stiff neck.  You have trouble seeing.  You have trouble speaking.  You have pain in the eye or ear.  Your muscles are weak or you lose muscle control.  You lose your balance or have trouble walking.  You feel like you will pass out (faint) or you pass out.  You have confusion. This information is not intended to replace advice given to you by your health care provider. Make sure you discuss any questions you have with your health care provider. Document Released: 04/14/2008 Document Revised: 12/12/2015 Document Reviewed:  10/29/2014 Elsevier Interactive Patient Education  [employer]2018 Elsevier Inc.

## 2018-06-11 LAB — BILE ACIDS, TOTAL: BILE ACIDS TOTAL: 7.3 umol/L (ref 0.0–10.0)

## 2018-06-11 LAB — CULTURE, OB URINE: Culture: 60000 — AB

## 2018-06-12 ENCOUNTER — Encounter: Payer: Self-pay | Admitting: Advanced Practice Midwife

## 2018-06-12 DIAGNOSIS — O09219 Supervision of pregnancy with history of pre-term labor, unspecified trimester: Secondary | ICD-10-CM

## 2018-06-12 DIAGNOSIS — F419 Anxiety disorder, unspecified: Secondary | ICD-10-CM

## 2018-06-12 DIAGNOSIS — F32A Depression, unspecified: Secondary | ICD-10-CM | POA: Insufficient documentation

## 2018-06-12 DIAGNOSIS — F329 Major depressive disorder, single episode, unspecified: Secondary | ICD-10-CM | POA: Insufficient documentation

## 2018-06-12 DIAGNOSIS — O09899 Supervision of other high risk pregnancies, unspecified trimester: Secondary | ICD-10-CM | POA: Insufficient documentation

## 2018-06-13 ENCOUNTER — Other Ambulatory Visit (HOSPITAL_COMMUNITY): Payer: Self-pay | Admitting: *Deleted

## 2018-06-13 DIAGNOSIS — O139 Gestational [pregnancy-induced] hypertension without significant proteinuria, unspecified trimester: Secondary | ICD-10-CM

## 2018-06-15 ENCOUNTER — Ambulatory Visit (INDEPENDENT_AMBULATORY_CARE_PROVIDER_SITE_OTHER): Payer: Medicare Other | Admitting: Obstetrics & Gynecology

## 2018-06-15 VITALS — BP 126/92 | HR 98 | Wt 250.0 lb

## 2018-06-15 DIAGNOSIS — F329 Major depressive disorder, single episode, unspecified: Secondary | ICD-10-CM

## 2018-06-15 DIAGNOSIS — O0943 Supervision of pregnancy with grand multiparity, third trimester: Secondary | ICD-10-CM | POA: Diagnosis not present

## 2018-06-15 DIAGNOSIS — O09219 Supervision of pregnancy with history of pre-term labor, unspecified trimester: Principal | ICD-10-CM

## 2018-06-15 DIAGNOSIS — O09213 Supervision of pregnancy with history of pre-term labor, third trimester: Secondary | ICD-10-CM

## 2018-06-15 DIAGNOSIS — F419 Anxiety disorder, unspecified: Secondary | ICD-10-CM

## 2018-06-15 DIAGNOSIS — F418 Other specified anxiety disorders: Secondary | ICD-10-CM | POA: Diagnosis not present

## 2018-06-15 DIAGNOSIS — O09899 Supervision of other high risk pregnancies, unspecified trimester: Secondary | ICD-10-CM

## 2018-06-15 DIAGNOSIS — Z3A33 33 weeks gestation of pregnancy: Secondary | ICD-10-CM | POA: Diagnosis not present

## 2018-06-15 DIAGNOSIS — O0942 Supervision of pregnancy with grand multiparity, second trimester: Secondary | ICD-10-CM

## 2018-06-15 DIAGNOSIS — Z3480 Encounter for supervision of other normal pregnancy, unspecified trimester: Secondary | ICD-10-CM

## 2018-06-15 MED ORDER — HYDROXYPROGESTERONE CAPROATE 250 MG/ML IM OIL
250.0000 mg | TOPICAL_OIL | Freq: Once | INTRAMUSCULAR | Status: AC
  Administered 2018-06-15: 250 mg via INTRAMUSCULAR

## 2018-06-15 NOTE — Patient Instructions (Signed)

## 2018-06-15 NOTE — Progress Notes (Signed)
Pt states has been having pain in lower back & abdomen & hands are itchy.

## 2018-06-15 NOTE — Progress Notes (Signed)
   PRENATAL VISIT NOTE  Subjective:  Brianna Booth is a 32 y.o. [id number] at 4916w4d being seen today for ongoing prenatal care.  She is currently monitored for the following issues for this high-risk pregnancy and has Hx of preeclampsia, prior pregnancy, currently pregnant, second trimester; Supervision of other normal pregnancy, antepartum; Bell's palsy; History of preterm delivery, currently pregnant; and Anxiety and depression on their problem list.  Patient reports occasional contractions.  Contractions: Irritability. Vag. Bleeding: None.  Movement: Present. Denies leaking of fluid.   The following portions of the patient's history were reviewed and updated as appropriate: allergies, current medications, past family history, past medical history, past social history, past surgical history and problem list. Problem list updated.  Objective:   Vitals:   06/15/18 0937  BP: (!) 126/92  Pulse: 98  Weight: 250 lb (113.4 kg)    Fetal Status: Fetal Heart Rate (bpm): 146   Movement: Present     General:  Alert, oriented and cooperative. Patient is in no acute distress.  Skin: Skin is warm and dry. No rash noted.   Cardiovascular: Normal heart rate noted  Respiratory: Normal respiratory effort, no problems with respiration noted  Abdomen: Soft, gravid, appropriate for gestational age.  Pain/Pressure: Present     Pelvic: Cervical exam performed        Extremities: Normal range of motion.  Edema: Trace  Mental Status: Normal mood and affect. Normal behavior. Normal judgment and thought content.   Assessment and Plan:  Pregnancy: [id number] at 1616w4d  1. History of preterm delivery, currently pregnant H/o PTB - hydroxyprogesterone caproate (MAKENA) 250 mg/mL injection 250 mg  2. Supervision of other normal pregnancy, antepartum F/u growth US  Preterm labor symptoms and general obstetric precautions including but not limited to vaginal bleeding, contractions, leaking of fluid and fetal  movement were reviewed in detail with the patient. Please refer to After Visit Summary for other counseling recommendations.  Return in about 2 weeks (around 06/29/2018).  Future Appointments  Date Time Provider Department Center  06/17/2018 11:00 AM [facility] US 3 [facility]  06/24/2018  1:30 PM [facility] US 1 [facility]  07/01/2018  1:30 PM [facility] US 1 [facility]  07/08/2018  2:30 PM [facility] US 1 [facility]    Scheryl DarterJames Arnold, MD

## 2018-06-16 LAB — OBSTETRIC PANEL, INCLUDING HIV
ANTIBODY SCREEN: NEGATIVE
BASOS: 0 %
Basophils Absolute: 0 10*3/uL (ref 0.0–0.2)
EOS (ABSOLUTE): 0.3 10*3/uL (ref 0.0–0.4)
EOS: 4 %
HEMATOCRIT: 34.9 % (ref 34.0–46.6)
HEMOGLOBIN: 11.6 g/dL (ref 11.1–15.9)
HIV SCREEN 4TH GENERATION: NONREACTIVE
Hepatitis B Surface Ag: NEGATIVE
IMMATURE GRANS (ABS): 0 10*3/uL (ref 0.0–0.1)
Immature Granulocytes: 1 %
LYMPHS: 27 %
Lymphocytes Absolute: 2.2 10*3/uL (ref 0.7–3.1)
MCH: 29.1 pg (ref 26.6–33.0)
MCHC: 33.2 g/dL (ref 31.5–35.7)
MCV: 88 fL (ref 79–97)
MONOS ABS: 0.9 10*3/uL (ref 0.1–0.9)
Monocytes: 11 %
NEUTROS ABS: 4.7 10*3/uL (ref 1.4–7.0)
Neutrophils: 57 %
Platelets: 171 10*3/uL (ref 150–450)
RBC: 3.98 x10E6/uL (ref 3.77–5.28)
RDW: 12.4 % (ref 12.3–15.4)
RH TYPE: POSITIVE
RPR Ser Ql: NONREACTIVE
Rubella Antibodies, IGG: 2.59 index (ref >0.99)
WBC: 8.3 10*3/uL (ref 3.4–10.8)

## 2018-06-16 LAB — GLUCOSE TOLERANCE, 2 HOURS W/ 1HR
GLUCOSE, 2 HOUR: 88 mg/dL (ref 65–152)
Glucose, 1 hour: 113 mg/dL (ref 65–179)
Glucose, Fasting: 107 mg/dL — ABNORMAL HIGH (ref 65–91)

## 2018-06-17 ENCOUNTER — Ambulatory Visit (HOSPITAL_COMMUNITY): Payer: Medicare Other | Attending: Obstetrics & Gynecology

## 2018-06-20 ENCOUNTER — Telehealth: Payer: Self-pay | Admitting: *Deleted

## 2018-06-20 NOTE — Telephone Encounter (Signed)
Called pt to inform her that she has GDM and needs to schedule an appointment with the diabetes coordinator.  Pt did not pick up.  Message left requesting the pt call the office.

## 2018-06-20 NOTE — Telephone Encounter (Signed)
-----   Message from Reva Boresanya S Pratt, MD sent at 06/18/2018  3:09 PM EST ----- Has GDM--schedule with Meriam SpragueBeverly, needs meter, lancets, strips please.

## 2018-06-21 NOTE — Telephone Encounter (Signed)
Called pt to let her know of her GDM diagnosis.  Pt did not pick up on either number provided.  Left voicemail advising pt that she was being contacted regarding results and requesting that she call the office.  MyChart message sent.

## 2018-06-22 ENCOUNTER — Ambulatory Visit: Payer: Medicare Other

## 2018-06-24 ENCOUNTER — Ambulatory Visit (HOSPITAL_COMMUNITY): Payer: Medicare Other

## 2018-06-27 ENCOUNTER — Ambulatory Visit (HOSPITAL_COMMUNITY): Admission: RE | Admit: 2018-06-27 | Payer: Medicare Other | Source: Ambulatory Visit

## 2018-06-28 ENCOUNTER — Encounter: Payer: Self-pay | Admitting: *Deleted

## 2018-06-28 ENCOUNTER — Other Ambulatory Visit: Payer: Medicare Other

## 2018-06-29 ENCOUNTER — Encounter: Payer: Self-pay | Admitting: Obstetrics and Gynecology

## 2018-06-29 ENCOUNTER — Telehealth: Payer: Self-pay | Admitting: Obstetrics and Gynecology

## 2018-06-29 ENCOUNTER — Encounter: Payer: Medicare Other | Admitting: Obstetrics and Gynecology

## 2018-06-29 NOTE — Telephone Encounter (Signed)
Attempted to call pt about missed OB appt. No answer, LVM with reminder on diabetes edu appt 12/12 @ 2 and next OB appt

## 2018-06-30 ENCOUNTER — Other Ambulatory Visit: Payer: Medicare Other

## 2018-06-30 ENCOUNTER — Telehealth: Payer: Self-pay | Admitting: Family Medicine

## 2018-06-30 NOTE — Telephone Encounter (Signed)
Patient called to cancel her Diabetic EDUCATION appt today, she said she could not make it to our office to so she rescheduled

## 2018-07-01 ENCOUNTER — Inpatient Hospital Stay (HOSPITAL_COMMUNITY)
Admission: AD | Admit: 2018-07-01 | Discharge: 2018-07-06 | DRG: 807 | Disposition: A | Discharge status: Home / Self Care | Payer: Medicare Other | Attending: Obstetrics and Gynecology | Admitting: Obstetrics and Gynecology

## 2018-07-01 ENCOUNTER — Other Ambulatory Visit: Payer: Self-pay

## 2018-07-01 ENCOUNTER — Encounter (HOSPITAL_COMMUNITY): Payer: Self-pay

## 2018-07-01 ENCOUNTER — Ambulatory Visit (HOSPITAL_BASED_OUTPATIENT_CLINIC_OR_DEPARTMENT_OTHER)
Admission: RE | Admit: 2018-07-01 | Discharge: 2018-07-01 | Disposition: A | Discharge status: Home / Self Care | Payer: Medicare Other | Source: Ambulatory Visit | Attending: Obstetrics & Gynecology | Admitting: Obstetrics & Gynecology

## 2018-07-01 DIAGNOSIS — O134 Gestational [pregnancy-induced] hypertension without significant proteinuria, complicating childbirth: Secondary | ICD-10-CM

## 2018-07-01 DIAGNOSIS — O09213 Supervision of pregnancy with history of pre-term labor, third trimester: Secondary | ICD-10-CM

## 2018-07-01 DIAGNOSIS — O1414 Severe pre-eclampsia complicating childbirth: Principal | ICD-10-CM | POA: Diagnosis present

## 2018-07-01 DIAGNOSIS — O99214 Obesity complicating childbirth: Secondary | ICD-10-CM | POA: Diagnosis present

## 2018-07-01 DIAGNOSIS — O24410 Gestational diabetes mellitus in pregnancy, diet controlled: Secondary | ICD-10-CM | POA: Diagnosis not present

## 2018-07-01 DIAGNOSIS — Z3A35 35 weeks gestation of pregnancy: Secondary | ICD-10-CM

## 2018-07-01 DIAGNOSIS — O2662 Liver and biliary tract disorders in childbirth: Secondary | ICD-10-CM

## 2018-07-01 DIAGNOSIS — O368330 Maternal care for abnormalities of the fetal heart rate or rhythm, third trimester, not applicable or unspecified: Secondary | ICD-10-CM | POA: Diagnosis not present

## 2018-07-01 DIAGNOSIS — F419 Anxiety disorder, unspecified: Secondary | ICD-10-CM | POA: Diagnosis present

## 2018-07-01 DIAGNOSIS — O99344 Other mental disorders complicating childbirth: Secondary | ICD-10-CM | POA: Diagnosis present

## 2018-07-01 DIAGNOSIS — O0933 Supervision of pregnancy with insufficient antenatal care, third trimester: Secondary | ICD-10-CM | POA: Diagnosis not present

## 2018-07-01 DIAGNOSIS — Z87891 Personal history of nicotine dependence: Secondary | ICD-10-CM

## 2018-07-01 DIAGNOSIS — Z3A36 36 weeks gestation of pregnancy: Secondary | ICD-10-CM | POA: Diagnosis not present

## 2018-07-01 DIAGNOSIS — J45909 Unspecified asthma, uncomplicated: Secondary | ICD-10-CM | POA: Diagnosis present

## 2018-07-01 DIAGNOSIS — O133 Gestational [pregnancy-induced] hypertension without significant proteinuria, third trimester: Secondary | ICD-10-CM | POA: Insufficient documentation

## 2018-07-01 DIAGNOSIS — O24419 Gestational diabetes mellitus in pregnancy, unspecified control: Secondary | ICD-10-CM | POA: Diagnosis present

## 2018-07-01 DIAGNOSIS — O24420 Gestational diabetes mellitus in childbirth, diet controlled: Secondary | ICD-10-CM | POA: Diagnosis present

## 2018-07-01 DIAGNOSIS — Z3480 Encounter for supervision of other normal pregnancy, unspecified trimester: Secondary | ICD-10-CM

## 2018-07-01 DIAGNOSIS — F329 Major depressive disorder, single episode, unspecified: Secondary | ICD-10-CM | POA: Diagnosis present

## 2018-07-01 DIAGNOSIS — E669 Obesity, unspecified: Secondary | ICD-10-CM | POA: Diagnosis present

## 2018-07-01 DIAGNOSIS — O09293 Supervision of pregnancy with other poor reproductive or obstetric history, third trimester: Secondary | ICD-10-CM | POA: Diagnosis not present

## 2018-07-01 DIAGNOSIS — G510 Bell's palsy: Secondary | ICD-10-CM

## 2018-07-01 DIAGNOSIS — O99213 Obesity complicating pregnancy, third trimester: Secondary | ICD-10-CM | POA: Diagnosis not present

## 2018-07-01 DIAGNOSIS — O9952 Diseases of the respiratory system complicating childbirth: Secondary | ICD-10-CM | POA: Diagnosis present

## 2018-07-01 DIAGNOSIS — O139 Gestational [pregnancy-induced] hypertension without significant proteinuria, unspecified trimester: Secondary | ICD-10-CM | POA: Diagnosis present

## 2018-07-01 DIAGNOSIS — O09292 Supervision of pregnancy with other poor reproductive or obstetric history, second trimester: Secondary | ICD-10-CM

## 2018-07-01 DIAGNOSIS — O09219 Supervision of pregnancy with history of pre-term labor, unspecified trimester: Secondary | ICD-10-CM

## 2018-07-01 DIAGNOSIS — O09899 Supervision of other high risk pregnancies, unspecified trimester: Secondary | ICD-10-CM

## 2018-07-01 LAB — COMPREHENSIVE METABOLIC PANEL
ALK PHOS: 103 U/L (ref 38–126)
ALT: 9 U/L (ref 0–44)
AST: 16 U/L (ref 15–41)
Albumin: 2.9 g/dL — ABNORMAL LOW (ref 3.5–5.0)
Anion gap: 8 (ref 5–15)
BUN: 7 mg/dL (ref 6–20)
CALCIUM: 8.7 mg/dL — AB (ref 8.9–10.3)
CO2: 21 mmol/L — ABNORMAL LOW (ref 22–32)
Chloride: 106 mmol/L (ref 98–111)
Creatinine, Ser: 0.39 mg/dL — ABNORMAL LOW (ref 0.44–1.00)
GFR calc Af Amer: >60 mL/min (ref >60)
Glucose, Bld: 98 mg/dL (ref 70–99)
Potassium: 3.8 mmol/L (ref 3.5–5.1)
Sodium: 135 mmol/L (ref 135–145)
Total Bilirubin: 0.3 mg/dL (ref 0.3–1.2)
Total Protein: 6.5 g/dL (ref 6.5–8.1)

## 2018-07-01 LAB — CBC
HCT: 35.7 % — ABNORMAL LOW (ref 36.0–46.0)
Hemoglobin: 11.9 g/dL — ABNORMAL LOW (ref 12.0–15.0)
MCH: 29.8 pg (ref 26.0–34.0)
MCHC: 33.3 g/dL (ref 30.0–36.0)
MCV: 89.3 fL (ref 80.0–100.0)
PLATELETS: 193 10*3/uL (ref 150–400)
RBC: 4 MIL/uL (ref 3.87–5.11)
RDW: 12.8 % (ref 11.5–15.5)
WBC: 9.3 10*3/uL (ref 4.0–10.5)
nRBC: 0 % (ref 0.0–0.2)

## 2018-07-01 LAB — PROTEIN / CREATININE RATIO, URINE
Creatinine, Urine: 268 mg/dL
Protein Creatinine Ratio: 0.03 mg/mg{Cre} (ref 0.00–0.15)
Total Protein, Urine: 8 mg/dL

## 2018-07-01 LAB — TYPE AND SCREEN
ABO/RH(D): O POS
Antibody Screen: NEGATIVE

## 2018-07-01 LAB — OB RESULTS CONSOLE GBS: GBS: NEGATIVE

## 2018-07-01 LAB — ABO/RH: ABO/RH(D): O POS

## 2018-07-01 LAB — GLUCOSE, CAPILLARY: Glucose-Capillary: 69 mg/dL — ABNORMAL LOW (ref 70–99)

## 2018-07-01 MED ORDER — SOD CITRATE-CITRIC ACID 500-334 MG/5ML PO SOLN: 30.0000 mL | ORAL | Status: DC | PRN

## 2018-07-01 MED ORDER — LACTATED RINGERS IV SOLN
INTRAVENOUS | Status: DC
  Administered 2018-07-01 – 2018-07-03 (×2): via INTRAVENOUS

## 2018-07-01 MED ORDER — OXYCODONE-ACETAMINOPHEN 5-325 MG PO TABS: 1.0000 | ORAL_TABLET | ORAL | Status: DC | PRN

## 2018-07-01 MED ORDER — FENTANYL CITRATE (PF) 100 MCG/2ML IJ SOLN: 100.0000 ug | INTRAMUSCULAR | Status: DC | PRN

## 2018-07-01 MED ORDER — DOCUSATE SODIUM 100 MG PO CAPS
100.0000 mg | ORAL_CAPSULE | Freq: Every day | ORAL | Status: DC
  Administered 2018-07-02 – 2018-07-03 (×2): 100 mg via ORAL
  Filled 2018-07-01 (×2): qty 1

## 2018-07-01 MED ORDER — MISOPROSTOL 50MCG HALF TABLET: 50.0000 ug | ORAL_TABLET | ORAL | Status: DC | PRN

## 2018-07-01 MED ORDER — LAMOTRIGINE 200 MG PO TABS
200.0000 mg | ORAL_TABLET | Freq: Three times a day (TID) | ORAL | Status: DC
  Filled 2018-07-01 (×3): qty 1

## 2018-07-01 MED ORDER — PENICILLIN G 3 MILLION UNITS IVPB - SIMPLE MED: 3.0000 10*6.[IU] | INTRAVENOUS | Status: DC

## 2018-07-01 MED ORDER — ACETAMINOPHEN 325 MG PO TABS: 650.0000 mg | ORAL_TABLET | ORAL | Status: DC | PRN

## 2018-07-01 MED ORDER — ONDANSETRON HCL 4 MG/2ML IJ SOLN: 4.0000 mg | Freq: Four times a day (QID) | INTRAMUSCULAR | Status: DC | PRN

## 2018-07-01 MED ORDER — OXYTOCIN 40 UNITS IN LACTATED RINGERS INFUSION - SIMPLE MED: 2.5000 [IU]/h | INTRAVENOUS | Status: DC

## 2018-07-01 MED ORDER — LACTATED RINGERS IV SOLN
INTRAVENOUS | Status: DC
  Administered 2018-07-01: 17:00:00 via INTRAVENOUS

## 2018-07-01 MED ORDER — MONTELUKAST SODIUM 10 MG PO TABS
10.0000 mg | ORAL_TABLET | Freq: Every day | ORAL | Status: DC
  Administered 2018-07-02 – 2018-07-05 (×3): 10 mg via ORAL
  Filled 2018-07-01 (×6): qty 1

## 2018-07-01 MED ORDER — TERBUTALINE SULFATE 1 MG/ML IJ SOLN: 0.2500 mg | Freq: Once | INTRAMUSCULAR | Status: DC | PRN

## 2018-07-01 MED ORDER — HYDRALAZINE HCL 20 MG/ML IJ SOLN: 5.0000 mg | INTRAMUSCULAR | Status: DC | PRN

## 2018-07-01 MED ORDER — OXYCODONE-ACETAMINOPHEN 5-325 MG PO TABS: 2.0000 | ORAL_TABLET | ORAL | Status: DC | PRN

## 2018-07-01 MED ORDER — BUTALBITAL-APAP-CAFFEINE 50-325-40 MG PO TABS
2.0000 | ORAL_TABLET | Freq: Four times a day (QID) | ORAL | Status: DC | PRN
  Administered 2018-07-01 – 2018-07-02 (×3): 2 via ORAL
  Administered 2018-07-02: 1 via ORAL
  Administered 2018-07-03 (×3): 2 via ORAL
  Filled 2018-07-01 (×7): qty 2

## 2018-07-01 MED ORDER — SODIUM CHLORIDE 0.9 % IV SOLN
5.0000 10*6.[IU] | Freq: Once | INTRAVENOUS | Status: DC
  Filled 2018-07-01: qty 5

## 2018-07-01 MED ORDER — LACTATED RINGERS IV SOLN: 500.0000 mL | INTRAVENOUS | Status: DC | PRN

## 2018-07-01 MED ORDER — BUTALBITAL-APAP-CAFFEINE 50-325-40 MG PO TABS: 1.0000 | ORAL_TABLET | Freq: Four times a day (QID) | ORAL | Status: DC | PRN

## 2018-07-01 MED ORDER — PRENATAL MULTIVITAMIN CH
1.0000 | ORAL_TABLET | Freq: Every day | ORAL | Status: DC
  Administered 2018-07-02 – 2018-07-03 (×2): 1 via ORAL
  Filled 2018-07-01 (×2): qty 1

## 2018-07-01 MED ORDER — BUTALBITAL-APAP-CAFFEINE 50-325-40 MG PO TABS
2.0000 | ORAL_TABLET | Freq: Four times a day (QID) | ORAL | Status: DC | PRN
  Administered 2018-07-01: 2 via ORAL
  Filled 2018-07-01: qty 2

## 2018-07-01 MED ORDER — ZOLPIDEM TARTRATE 5 MG PO TABS
5.0000 mg | ORAL_TABLET | Freq: Every evening | ORAL | Status: DC | PRN
  Administered 2018-07-01 – 2018-07-02 (×2): 5 mg via ORAL
  Filled 2018-07-01 (×2): qty 1

## 2018-07-01 MED ORDER — CALCIUM CARBONATE ANTACID 500 MG PO CHEW: 2.0000 | CHEWABLE_TABLET | ORAL | Status: DC | PRN

## 2018-07-01 MED ORDER — OXYTOCIN BOLUS FROM INFUSION: 500.0000 mL | Freq: Once | INTRAVENOUS | Status: DC

## 2018-07-01 MED ORDER — LIDOCAINE HCL (PF) 1 % IJ SOLN: 30.0000 mL | INTRAMUSCULAR | Status: DC | PRN
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
## 2018-07-01 NOTE — ED Notes (Signed)
Report called to W[facility]hip[place]ey, Charity fundraiserN.  Pt to room 171.

## 2018-07-01 NOTE — Anesthesia Pain Management Evaluation Note (Signed)
  CRNA Pain Management Visit Note  Patient: Brianna Booth, 32 y.o., female  "Hello I am a member of the anesthesia team at [facility]. We have an anesthesia team available at all times to provide care throughout the hospital, including epidural management and anesthesia for C-section. I don't know your plan for the delivery whether it a natural birth, water birth, IV sedation, nitrous supplementation, doula or epidural, but we want to meet your pain goals."   1.Was your pain managed to your expectations on prior hospitalizations?   Yes   2.What is your expectation for pain management during this hospitalization?     Epidural  3.How can we help you reach that goal? epidural  Record the patient's initial score and the patient's pain goal.   Pain: 5  Pain Goal: 7 [facility] wants you to be able to say your pain was always managed very well.  Lindsay Soulliere 07/01/2018

## 2018-07-02 ENCOUNTER — Inpatient Hospital Stay (HOSPITAL_BASED_OUTPATIENT_CLINIC_OR_DEPARTMENT_OTHER): Payer: Medicare Other

## 2018-07-02 DIAGNOSIS — O09293 Supervision of pregnancy with other poor reproductive or obstetric history, third trimester: Secondary | ICD-10-CM

## 2018-07-02 DIAGNOSIS — O368330 Maternal care for abnormalities of the fetal heart rate or rhythm, third trimester, not applicable or unspecified: Secondary | ICD-10-CM

## 2018-07-02 DIAGNOSIS — Z3A36 36 weeks gestation of pregnancy: Secondary | ICD-10-CM

## 2018-07-02 DIAGNOSIS — O133 Gestational [pregnancy-induced] hypertension without significant proteinuria, third trimester: Secondary | ICD-10-CM

## 2018-07-02 DIAGNOSIS — O09213 Supervision of pregnancy with history of pre-term labor, third trimester: Secondary | ICD-10-CM

## 2018-07-02 DIAGNOSIS — O0933 Supervision of pregnancy with insufficient antenatal care, third trimester: Secondary | ICD-10-CM

## 2018-07-02 DIAGNOSIS — O99213 Obesity complicating pregnancy, third trimester: Secondary | ICD-10-CM

## 2018-07-02 DIAGNOSIS — O24410 Gestational diabetes mellitus in pregnancy, diet controlled: Secondary | ICD-10-CM

## 2018-07-02 DIAGNOSIS — O134 Gestational [pregnancy-induced] hypertension without significant proteinuria, complicating childbirth: Secondary | ICD-10-CM

## 2018-07-02 DIAGNOSIS — O2662 Liver and biliary tract disorders in childbirth: Secondary | ICD-10-CM

## 2018-07-02 LAB — GLUCOSE, CAPILLARY
Glucose-Capillary: 107 mg/dL — ABNORMAL HIGH (ref 70–99)
Glucose-Capillary: 115 mg/dL — ABNORMAL HIGH (ref 70–99)
Glucose-Capillary: 52 mg/dL — ABNORMAL LOW (ref 70–99)
Glucose-Capillary: 70 mg/dL (ref 70–99)
Glucose-Capillary: 71 mg/dL (ref 70–99)
Glucose-Capillary: 81 mg/dL (ref 70–99)

## 2018-07-02 LAB — RPR: RPR Ser Ql: NONREACTIVE

## 2018-07-02 LAB — BILE ACIDS, TOTAL: Bile Acids Total: 7.1 umol/L (ref 0.0–10.0)

## 2018-07-02 MED ORDER — PROMETHAZINE HCL 25 MG/ML IJ SOLN
25.0000 mg | Freq: Four times a day (QID) | INTRAMUSCULAR | Status: DC | PRN
  Administered 2018-07-02 – 2018-07-03 (×6): 25 mg via INTRAVENOUS
  Filled 2018-07-02 (×6): qty 1

## 2018-07-02 MED ORDER — WITCH HAZEL-GLYCERIN EX PADS
MEDICATED_PAD | CUTANEOUS | Status: DC | PRN
  Administered 2018-07-02: 17:00:00 via TOPICAL

## 2018-07-02 MED ORDER — ONDANSETRON HCL 4 MG/2ML IJ SOLN
4.0000 mg | Freq: Four times a day (QID) | INTRAMUSCULAR | Status: DC | PRN
  Filled 2018-07-02: qty 2

## 2018-07-02 MED ORDER — DIBUCAINE 1 % RE OINT
TOPICAL_OINTMENT | RECTAL | Status: DC | PRN
  Administered 2018-07-02 – 2018-07-03 (×2): via RECTAL
  Filled 2018-07-02 (×2): qty 28

## 2018-07-02 NOTE — Progress Notes (Signed)
Patient repeatedly takes her self off the monitor even after informing her just 30 min monitoring; this nurse found pt 3x in the bathroom with monitors above her breast.
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
## 2018-07-02 NOTE — Progress Notes (Signed)
Pt. W/ 500 cc emesis. Promethazine given. Pt. States n/v have been an issue for her. Discussed her recent meal. Pizza w/ pepperoni, and milk. Advised pt. To eat smaller less greasy meals when n/v have been an issue.

## 2018-07-02 NOTE — Progress Notes (Signed)
Went to adjust the pt's monitor and she tried to get up out of the bed, fell back on to the bed, got back up, and stumbled to the bathroom. She wouldn't open her eyes all the way. I asked if she would open her eyes and if she was okay. She mumbled/slurred a response that I could not understand. Pt required assistance wiping and putting her underwear back on. Once back in bed I tried to put her back on the monitors. She fell asleep and every 10-15 seconds she would get panicked and not know what I was doing. I kept reminding her I was adjusting her monitors and she would fall back asleep. She suddenly woke up and started frantically mumbling something and pointing at the monitor. I asked if it was okay for me to put her back on the monitor and she shook her head no. I asked if she was refusing monitoring and she said "one hour". I told her I would come back in an hour to put her back on the monitors but she had fallen back to sleep and did not respond. I am not sure if this is a reaction to the phenergan I gave at 0142. Will try to put the patient back on the monitor at 0445 and continue to monitor mental status.   Arva ChafeMeghan E Mickey Esguerra, RN

## 2018-07-02 NOTE — Progress Notes (Signed)
Pt refusing continuous monitoring.  Arva ChafeMeghan E Aowyn Rozeboom, RN

## 2018-07-03 LAB — CBC
HCT: 33.9 % — ABNORMAL LOW (ref 36.0–46.0)
HCT: 34 % — ABNORMAL LOW (ref 36.0–46.0)
HEMOGLOBIN: 10.9 g/dL — AB (ref 12.0–15.0)
Hemoglobin: 11 g/dL — ABNORMAL LOW (ref 12.0–15.0)
MCH: 28.6 pg (ref 26.0–34.0)
MCH: 29 pg (ref 26.0–34.0)
MCHC: 32.2 g/dL (ref 30.0–36.0)
MCHC: 32.4 g/dL (ref 30.0–36.0)
MCV: 89 fL (ref 80.0–100.0)
MCV: 89.7 fL (ref 80.0–100.0)
Platelets: 175 10*3/uL (ref 150–400)
Platelets: 173 10*3/uL (ref 150–400)
RBC: 3.81 MIL/uL — ABNORMAL LOW (ref 3.87–5.11)
RBC: 3.79 MIL/uL — ABNORMAL LOW (ref 3.87–5.11)
RDW: 13 % (ref 11.5–15.5)
RDW: 13.2 % (ref 11.5–15.5)
WBC: 8.3 10*3/uL (ref 4.0–10.5)
WBC: 9 10*3/uL (ref 4.0–10.5)
nRBC: 0 % (ref 0.0–0.2)
nRBC: 0 % (ref 0.0–0.2)

## 2018-07-03 LAB — CULTURE, BETA STREP (GROUP B ONLY)

## 2018-07-03 LAB — COMPREHENSIVE METABOLIC PANEL
ALK PHOS: 101 U/L (ref 38–126)
ALT: 9 U/L (ref 0–44)
AST: 15 U/L (ref 15–41)
Albumin: 2.7 g/dL — ABNORMAL LOW (ref 3.5–5.0)
Anion gap: 6 (ref 5–15)
BILIRUBIN TOTAL: 0.5 mg/dL (ref 0.3–1.2)
BUN: 6 mg/dL (ref 6–20)
CO2: 23 mmol/L (ref 22–32)
Calcium: 8.7 mg/dL — ABNORMAL LOW (ref 8.9–10.3)
Chloride: 106 mmol/L (ref 98–111)
Creatinine, Ser: 0.57 mg/dL (ref 0.44–1.00)
GFR calc Af Amer: >60 mL/min (ref >60)
GFR calc non Af Amer: >60 mL/min (ref >60)
Glucose, Bld: 89 mg/dL (ref 70–99)
Potassium: 3.4 mmol/L — ABNORMAL LOW (ref 3.5–5.1)
Sodium: 135 mmol/L (ref 135–145)
TOTAL PROTEIN: 6.3 g/dL — AB (ref 6.5–8.1)

## 2018-07-03 LAB — PROTEIN / CREATININE RATIO, URINE
CREATININE, URINE: 95 mg/dL
Total Protein, Urine: <6 mg/dL

## 2018-07-03 LAB — GLUCOSE, CAPILLARY
GLUCOSE-CAPILLARY: 73 mg/dL (ref 70–99)
Glucose-Capillary: 61 mg/dL — ABNORMAL LOW (ref 70–99)

## 2018-07-03 MED ORDER — OXYTOCIN 40 UNITS IN LACTATED RINGERS INFUSION - SIMPLE MED
2.5000 [IU]/h | INTRAVENOUS | Status: DC
  Filled 2018-07-03: qty 1000

## 2018-07-03 MED ORDER — LIDOCAINE HCL (PF) 1 % IJ SOLN
30.0000 mL | INTRAMUSCULAR | Status: DC | PRN
  Filled 2018-07-03: qty 30

## 2018-07-03 MED ORDER — LABETALOL HCL 5 MG/ML IV SOLN
20.0000 mg | INTRAVENOUS | Status: DC | PRN
  Administered 2018-07-04: 20 mg via INTRAVENOUS
  Filled 2018-07-03: qty 4

## 2018-07-03 MED ORDER — TERBUTALINE SULFATE 1 MG/ML IJ SOLN: 0.2500 mg | Freq: Once | INTRAMUSCULAR | Status: DC | PRN

## 2018-07-03 MED ORDER — LABETALOL HCL 5 MG/ML IV SOLN: 80.0000 mg | INTRAVENOUS | Status: DC | PRN

## 2018-07-03 MED ORDER — DIPHENHYDRAMINE HCL 50 MG/ML IJ SOLN: 12.5000 mg | INTRAMUSCULAR | Status: DC | PRN

## 2018-07-03 MED ORDER — SOD CITRATE-CITRIC ACID 500-334 MG/5ML PO SOLN: 30.0000 mL | ORAL | Status: DC | PRN

## 2018-07-03 MED ORDER — FENTANYL CITRATE (PF) 100 MCG/2ML IJ SOLN
100.0000 ug | INTRAMUSCULAR | Status: DC | PRN
  Administered 2018-07-03 (×4): 100 ug via INTRAVENOUS
  Filled 2018-07-03 (×4): qty 2

## 2018-07-03 MED ORDER — MAGNESIUM SULFATE 40 G IN LACTATED RINGERS - SIMPLE
2.0000 g/h | INTRAVENOUS | Status: DC
  Administered 2018-07-03 – 2018-07-04 (×2): 2 g/h via INTRAVENOUS
  Filled 2018-07-03 (×2): qty 500

## 2018-07-03 MED ORDER — PHENYLEPHRINE 40 MCG/ML (10ML) SYRINGE FOR IV PUSH (FOR BLOOD PRESSURE SUPPORT)
80.0000 ug | PREFILLED_SYRINGE | INTRAVENOUS | Status: DC | PRN
  Filled 2018-07-03 (×2): qty 10

## 2018-07-03 MED ORDER — ALBUTEROL SULFATE (2.5 MG/3ML) 0.083% IN NEBU: 3.0000 mL | INHALATION_SOLUTION | RESPIRATORY_TRACT | Status: DC | PRN

## 2018-07-03 MED ORDER — MISOPROSTOL 50MCG HALF TABLET
50.0000 ug | ORAL_TABLET | Freq: Once | ORAL | Status: AC
  Administered 2018-07-03: 50 ug via ORAL
  Filled 2018-07-03: qty 1

## 2018-07-03 MED ORDER — LACTATED RINGERS IV SOLN: 500.0000 mL | Freq: Once | INTRAVENOUS | Status: DC

## 2018-07-03 MED ORDER — ONDANSETRON HCL 4 MG/2ML IJ SOLN: 4.0000 mg | Freq: Four times a day (QID) | INTRAMUSCULAR | Status: DC | PRN

## 2018-07-03 MED ORDER — LABETALOL HCL 5 MG/ML IV SOLN: 40.0000 mg | INTRAVENOUS | Status: DC | PRN

## 2018-07-03 MED ORDER — MISOPROSTOL 200 MCG PO TABS: 600.0000 ug | ORAL_TABLET | Freq: Once | ORAL | Status: DC | PRN

## 2018-07-03 MED ORDER — LACTATED RINGERS IV SOLN
INTRAVENOUS | Status: DC
  Administered 2018-07-03 (×2): via INTRAVENOUS

## 2018-07-03 MED ORDER — EPHEDRINE 5 MG/ML INJ
10.0000 mg | INTRAVENOUS | Status: DC | PRN
  Filled 2018-07-03: qty 2

## 2018-07-03 MED ORDER — MAGNESIUM SULFATE BOLUS VIA INFUSION
4.0000 g | Freq: Once | INTRAVENOUS | Status: AC
  Administered 2018-07-03: 4 g via INTRAVENOUS
  Filled 2018-07-03: qty 500

## 2018-07-03 MED ORDER — ACETAMINOPHEN 325 MG PO TABS: 650.0000 mg | ORAL_TABLET | ORAL | Status: DC | PRN

## 2018-07-03 MED ORDER — OXYTOCIN BOLUS FROM INFUSION
500.0000 mL | Freq: Once | INTRAVENOUS | Status: AC
  Administered 2018-07-04: 500 mL via INTRAVENOUS

## 2018-07-03 MED ORDER — ZOLPIDEM TARTRATE 5 MG PO TABS: 5.0000 mg | ORAL_TABLET | Freq: Every evening | ORAL | Status: DC | PRN

## 2018-07-03 MED ORDER — LACTATED RINGERS IV SOLN: 500.0000 mL | INTRAVENOUS | Status: DC | PRN

## 2018-07-03 MED ORDER — OXYTOCIN 40 UNITS IN LACTATED RINGERS INFUSION - SIMPLE MED
1.0000 m[IU]/min | INTRAVENOUS | Status: DC
  Administered 2018-07-03 (×2): 2 m[IU]/min via INTRAVENOUS
  Filled 2018-07-03: qty 1000

## 2018-07-03 MED ORDER — HYDRALAZINE HCL 20 MG/ML IJ SOLN: 10.0000 mg | INTRAMUSCULAR | Status: DC | PRN

## 2018-07-03 MED ORDER — TRANEXAMIC ACID-NACL 1000-0.7 MG/100ML-% IV SOLN
1000.0000 mg | INTRAVENOUS | Status: DC | PRN
  Filled 2018-07-03 (×2): qty 100

## 2018-07-03 MED ORDER — PHENYLEPHRINE 40 MCG/ML (10ML) SYRINGE FOR IV PUSH (FOR BLOOD PRESSURE SUPPORT)
80.0000 ug | PREFILLED_SYRINGE | INTRAVENOUS | Status: DC | PRN
  Filled 2018-07-03: qty 10

## 2018-07-03 MED ORDER — CARBOPROST TROMETHAMINE 250 MCG/ML IM SOLN: 250.0000 ug | INTRAMUSCULAR | Status: DC | PRN

## 2018-07-03 MED ORDER — FENTANYL 2.5 MCG/ML BUPIVACAINE 1/10 % EPIDURAL INFUSION (WH - ANES)
14.0000 mL/h | INTRAMUSCULAR | Status: DC | PRN
  Administered 2018-07-04: 14 mL/h via EPIDURAL
  Filled 2018-07-03: qty 100

## 2018-07-03 NOTE — Progress Notes (Signed)
This note also relates to the following rows which could not be included: CBG Lab Component - View only - Cannot attach notes to extension rows  Patient was given 4 oz of apple juice and her breakfast tray has arrived.

## 2018-07-03 NOTE — Progress Notes (Signed)
Patient ID: Brianna Booth, female   DOB: [birth year], 32 y.o.   MRN: [id number] ACULTY PRACTICE ANTEPARTUM COMPREHENSIVE PROGRESS NOTE  Brianna Booth is a 32 y.o. [id number] at 3977w1d  who is admitted for abnormal BPP of 4/8.    Fetal presentation is cephalic. Length of Stay:  2  Days  Subjective: Pt had an initial BPP of 4/8. It was repeated on yesterday and it was 8/8. Today, pt c/o HA, visual changes and RUQ pain. Patient reports good fetal movement.  She reports no uterine contractions, no bleeding and no loss of fluid per vagina.  Vitals:  Blood pressure 137/87, pulse 99, temperature 98.2 F (36.8 C), temperature source Oral, resp. rate 16, height 5\' 7"  (1.702 m), weight 115.8 kg, last menstrual period 11/12/2017, SpO2 100 %. Physical Examination: General appearance - alert, well appearing, and in no distress Heart - normal rate, regular rhythm, normal S1, S2, no murmurs, rubs, clicks or gallops Abdomen - soft gravid, no rebound or guardign. Mild RUQ tenderness.  Extremities - peripheral pulses normal, no pedal edema, no clubbing or cyanosis Cervical Exam: Not evaluated. Membranes:intact  Fetal Monitoring:  Baseline: 120's bpm, Variability: Good {> 6 bpm), Accelerations: Reactive, Decelerations: Absent and toco: no contractions  Labs:  Results for orders placed or performed during the hospital encounter of 07/01/18 (from the past 24 hour(s))  Glucose, capillary   Collection Time: 07/02/18  3:16 PM  Result Value Ref Range   Glucose-Capillary 81 70 - 99 mg/dL  Glucose, capillary   Collection Time: 07/02/18  6:40 PM  Result Value Ref Range   Glucose-Capillary 71 70 - 99 mg/dL  Glucose, capillary   Collection Time: 07/02/18 10:37 PM  Result Value Ref Range   Glucose-Capillary 107 (H) 70 - 99 mg/dL   Comment 1 Notify RN   Glucose, capillary   Collection Time: 07/03/18 10:42 AM  Result Value Ref Range   Glucose-Capillary 61 (L) 70 - 99 mg/dL  Glucose, capillary   Collection Time:  07/03/18 10:58 AM  Result Value Ref Range   Glucose-Capillary 73 70 - 99 mg/dL   Comment 1 Notify RN    Comment 2 Document in Chart     Imaging Studies:    BPP from yesterday 8/8    Medications:  Scheduled . docusate sodium  100 mg Oral Daily  . magnesium  4 g Intravenous Once  . montelukast  10 mg Oral QHS  . oxytocin 40 units in LR 1000 mL  500 mL Intravenous Once  . prenatal multivitamin  1 tablet Oral Q1200   I have reviewed the patient's current medications.  ASSESSMENT: Patient Active Problem List   Diagnosis Date Noted  . Gestational diabetes mellitus (GDM) in third trimester 07/01/2018  . Gestational hypertension 07/01/2018  . History of preterm delivery, currently pregnant 06/12/2018  . Anxiety and depression 06/12/2018  . Supervision of other normal pregnancy, antepartum 04/07/2018  . Hx of preeclampsia, prior pregnancy, currently pregnant, second trimester 02/23/2018  . Bell's palsy 03/05/2017  Pt with elevated BPs not in severe range however, pt with HA and visual changes that make her preeclampsia with severe features. I discussed this with Dr. Cherene AltesAdekola who recommends delivery based on the criteria noted.     PLAN: Transfer to L&D for delivery due to preeclampsia with severe features.  Continue routine antenatal care.   Tami Barren Harraway-Smith 07/03/2018,1:46 PM

## 2018-07-03 NOTE — Progress Notes (Signed)
Patient sitting straight up in bed, she was instructed to lay back and try to stay still so we can get a good tracing.

## 2018-07-03 NOTE — Progress Notes (Signed)
LABOR PROGRESS NOTE  Brianna Booth is a 32 y.o. [id number] at [redacted]w[redacted]d is being Induced for sev. Pre-e. She was originally admitted on 12/13 for abnormal BPP of 4/8.  Her f/u BPP was 8/8. She ruled in for sev. Pre-e based on HA, visual changes and RUQ pain.  S/p cytotec at 1815  Subjective: Doing well, c/o HA   Objective: BP (!) 152/86   Pulse 75   Temp 98.2 F (36.8 C) (Oral)   Resp 14   Ht 5\' 7"  (1.702 m)   Wt 115.8 kg   LMP 11/12/2017 (Within Weeks)   SpO2 97%   BMI 40.00 kg/m  or  Vitals:   07/03/18 1747 07/03/18 1803 07/03/18 1858 07/03/18 2000  BP: (!) 145/91 (!) 150/95 128/67 (!) 152/86  Pulse: 79 81 84 75  Resp: 16 16 16 14   Temp:      TempSrc:      SpO2:      Weight:      Height:       1745 Dilation: 3 Effacement (%): 40 Station: -3 Presentation: Vertex Exam by:: Dr Janina MayoPhillip FHT: baseline rate 130, moderate varibility, present acel, no decel Toco: irritability, no contractions noted    Labs: Lab Results  Component Value Date   WBC 8.3 07/03/2018   HGB 10.9 (L) 07/03/2018   HCT 33.9 (L) 07/03/2018   MCV 89.0 07/03/2018   PLT 175 07/03/2018   CBGs: 69-89. Every 4 hours in latent labor.   Patient Active Problem List   Diagnosis Date Noted  . Gestational diabetes mellitus (GDM) in third trimester 07/01/2018  . Gestational hypertension 07/01/2018  . History of preterm delivery, currently pregnant 06/12/2018  . Anxiety and depression 06/12/2018  . Supervision of other normal pregnancy, antepartum 04/07/2018  . Hx of preeclampsia, prior pregnancy, currently pregnant, second trimester 02/23/2018  . Bell's palsy 03/05/2017    Assessment / Plan: 32 y.o. [id number] at 804w1d being induced for Sev. Pre-e  GDM  Labor: s/p cytotec X1 at 1815, recheck at 2215 and consider starting pitocin  Fetal Wellbeing:  Category 1  Pain Control:  Pt currently comfortable, plans to get epidural  Anticipated MOD:  NSVD    07/03/2018, 8:53 PM

## 2018-07-03 NOTE — Progress Notes (Signed)
LABOR PROGRESS NOTE  Brianna Booth is a 32 y.o. [id number] at 884w1d admitted for IOL for severe PreE  Subjective: Feeling more uncomfortable with contractions  Objective: BP 131/87   Pulse 91   Temp 98.2 F (36.8 C) (Oral)   Resp 16   Ht 5\' 7"  (1.702 m)   Wt 115.8 kg   LMP 11/12/2017 (Within Weeks)   SpO2 97%   BMI 40.00 kg/m  or  Vitals:   07/03/18 1536 07/03/18 1614 07/03/18 1637 07/03/18 1707  BP: 128/76 125/71 136/90 131/87  Pulse: 78 82 89 91  Resp: 16 16 16 16   Temp:      TempSrc:      SpO2:      Weight:      Height:        Dilation: 3 Effacement (%): 40 Station: -3 Presentation: Vertex Exam by:: Dr Janina MayoPhillip FHT: baseline rate 140s, moderate varibility, + acel, no decel Toco: q883m (difficult to trace)  Labs: Lab Results  Component Value Date   WBC 8.3 07/03/2018   HGB 10.9 (L) 07/03/2018   HCT 33.9 (L) 07/03/2018   MCV 89.0 07/03/2018   PLT 175 07/03/2018    Patient Active Problem List   Diagnosis Date Noted  . Gestational diabetes mellitus (GDM) in third trimester 07/01/2018  . Gestational hypertension 07/01/2018  . History of preterm delivery, currently pregnant 06/12/2018  . Anxiety and depression 06/12/2018  . Supervision of other normal pregnancy, antepartum 04/07/2018  . Hx of preeclampsia, prior pregnancy, currently pregnant, second trimester 02/23/2018  . Bell's palsy 03/05/2017    Assessment / Plan: 32 y.o. [id number] at 5984w1d here for IOL for severe PreE  Labor: minimal change on pitocin x 3 hours; will give a dose of cytotec and reeval in 4 hours Fetal Wellbeing:  Cat 1  Pain Control:  Desires epidural  Anticipated MOD:  SVD  Brianna AbbotNimeka Jeda Pardue, MD  OB Fellow  07/03/2018, 6:02 PM

## 2018-07-03 NOTE — Progress Notes (Signed)
Patient having headaches and seeing spots.  Dr Erin FullingHarraway-Smith has evaluated her and she will transfer to labor and delivery.

## 2018-07-03 NOTE — Progress Notes (Signed)
LABOR PROGRESS NOTE  Willy Eddybony Bickle is a 32 y.o. [id number] at 10877w1d is being Induced for sev. Pre-e. She was originally admitted on 12/13 for abnormal BPP of 4/8.  Her f/u BPP was 8/8. She ruled in for sev. Pre-e based on HA, visual changes and RUQ pain.  S/p cytotec at 1815   Subjective: Doing well, c/o cramping. Thinking about getting epidural   Objective: BP 139/66   Pulse 71   Temp 97.9 F (36.6 C) (Axillary)   Resp 16   Ht 5\' 7"  (1.702 m)   Wt 115.8 kg   LMP 11/12/2017 (Within Weeks)   SpO2 97%   BMI 40.00 kg/m  or  Vitals:   07/03/18 1803 07/03/18 1858 07/03/18 2000 07/03/18 2100  BP: (!) 150/95 128/67 (!) 152/86 139/66  Pulse: 81 84 75 71  Resp: 16 16 14 16   Temp:    97.9 F (36.6 C)  TempSrc:    Axillary  SpO2:      Weight:      Height:        2220 Dilation: 3 Effacement (%): 50, 60 Station: Ballotable Presentation: Vertex Exam by:: Mahathi Pokorney SNM FHT: baseline rate 150, moderate varibility, positive acel, no decel Toco: irritability, no contractions noted. Palpates soft   Labs: Lab Results  Component Value Date   WBC 8.3 07/03/2018   HGB 10.9 (L) 07/03/2018   HCT 33.9 (L) 07/03/2018   MCV 89.0 07/03/2018   PLT 175 07/03/2018    Patient Active Problem List   Diagnosis Date Noted  . Gestational diabetes mellitus (GDM) in third trimester 07/01/2018  . Gestational hypertension 07/01/2018  . History of preterm delivery, currently pregnant 06/12/2018  . Anxiety and depression 06/12/2018  . Supervision of other normal pregnancy, antepartum 04/07/2018  . Hx of preeclampsia, prior pregnancy, currently pregnant, second trimester 02/23/2018  . Bell's palsy 03/05/2017    Assessment / Plan: 32 y.o. [id number] at 7177w1d being induced for Sev. Pre-e  GDM  Labor: s/p cytotec X1 at 1815, no cervical dilation change, restart pitocin  Fetal Wellbeing:  Category 1  Pain Control:  Plans to get epidural            Anticipated MOD:  NSVD  07/03/2018, 10:16 PM

## 2018-07-04 ENCOUNTER — Encounter (HOSPITAL_COMMUNITY): Payer: Self-pay | Admitting: *Deleted

## 2018-07-04 ENCOUNTER — Inpatient Hospital Stay (HOSPITAL_COMMUNITY): Payer: Medicare Other | Admitting: Anesthesiology

## 2018-07-04 DIAGNOSIS — Z3A36 36 weeks gestation of pregnancy: Secondary | ICD-10-CM

## 2018-07-04 DIAGNOSIS — O368330 Maternal care for abnormalities of the fetal heart rate or rhythm, third trimester, not applicable or unspecified: Secondary | ICD-10-CM

## 2018-07-04 DIAGNOSIS — O09213 Supervision of pregnancy with history of pre-term labor, third trimester: Secondary | ICD-10-CM

## 2018-07-04 DIAGNOSIS — O2662 Liver and biliary tract disorders in childbirth: Secondary | ICD-10-CM

## 2018-07-04 DIAGNOSIS — O134 Gestational [pregnancy-induced] hypertension without significant proteinuria, complicating childbirth: Secondary | ICD-10-CM

## 2018-07-04 LAB — COMPREHENSIVE METABOLIC PANEL
ALT: 9 U/L (ref 0–44)
AST: 20 U/L (ref 15–41)
Albumin: 2.7 g/dL — ABNORMAL LOW (ref 3.5–5.0)
Alkaline Phosphatase: 94 U/L (ref 38–126)
Anion gap: 8 (ref 5–15)
BUN: <5 mg/dL — ABNORMAL LOW (ref 6–20)
CO2: 21 mmol/L — ABNORMAL LOW (ref 22–32)
Calcium: 7.8 mg/dL — ABNORMAL LOW (ref 8.9–10.3)
Chloride: 105 mmol/L (ref 98–111)
Creatinine, Ser: 0.42 mg/dL — ABNORMAL LOW (ref 0.44–1.00)
GFR calc Af Amer: >60 mL/min (ref >60)
GFR calc non Af Amer: >60 mL/min (ref >60)
Glucose, Bld: 137 mg/dL — ABNORMAL HIGH (ref 70–99)
Potassium: 3.1 mmol/L — ABNORMAL LOW (ref 3.5–5.1)
Sodium: 134 mmol/L — ABNORMAL LOW (ref 135–145)
Total Bilirubin: 0.6 mg/dL (ref 0.3–1.2)
Total Protein: 6.2 g/dL — ABNORMAL LOW (ref 6.5–8.1)

## 2018-07-04 LAB — CBC
HCT: 33.6 % — ABNORMAL LOW (ref 36.0–46.0)
Hemoglobin: 10.9 g/dL — ABNORMAL LOW (ref 12.0–15.0)
MCH: 28.9 pg (ref 26.0–34.0)
MCHC: 32.4 g/dL (ref 30.0–36.0)
MCV: 89.1 fL (ref 80.0–100.0)
Platelets: 160 10*3/uL (ref 150–400)
RBC: 3.77 MIL/uL — ABNORMAL LOW (ref 3.87–5.11)
RDW: 13.1 % (ref 11.5–15.5)
WBC: 11.3 10*3/uL — ABNORMAL HIGH (ref 4.0–10.5)
nRBC: 0 % (ref 0.0–0.2)

## 2018-07-04 LAB — GLUCOSE, CAPILLARY
GLUCOSE-CAPILLARY: 69 mg/dL — AB (ref 70–99)
GLUCOSE-CAPILLARY: 86 mg/dL (ref 70–99)
Glucose-Capillary: 77 mg/dL (ref 70–99)
Glucose-Capillary: 58 mg/dL — ABNORMAL LOW (ref 70–99)
Glucose-Capillary: 66 mg/dL — ABNORMAL LOW (ref 70–99)
Glucose-Capillary: 91 mg/dL (ref 70–99)

## 2018-07-04 LAB — ABO/RH: ABO/RH(D): O POS

## 2018-07-04 MED ORDER — OXYCODONE-ACETAMINOPHEN 5-325 MG PO TABS
1.0000 | ORAL_TABLET | Freq: Once | ORAL | Status: AC
  Administered 2018-07-04: 1 via ORAL
  Filled 2018-07-04: qty 1

## 2018-07-04 MED ORDER — LACTATED RINGERS IV SOLN: 500.0000 mL | Freq: Once | INTRAVENOUS | Status: DC

## 2018-07-04 MED ORDER — OXYCODONE-ACETAMINOPHEN 5-325 MG PO TABS
1.0000 | ORAL_TABLET | Freq: Four times a day (QID) | ORAL | Status: DC | PRN
  Administered 2018-07-04: 1 via ORAL
  Administered 2018-07-05 – 2018-07-06 (×3): 2 via ORAL
  Administered 2018-07-06: 1 via ORAL
  Administered 2018-07-06: 2 via ORAL
  Filled 2018-07-04 (×2): qty 2
  Filled 2018-07-04: qty 1
  Filled 2018-07-04 (×3): qty 2

## 2018-07-04 MED ORDER — PHENYLEPHRINE 40 MCG/ML (10ML) SYRINGE FOR IV PUSH (FOR BLOOD PRESSURE SUPPORT)
80.0000 ug | PREFILLED_SYRINGE | INTRAVENOUS | Status: DC | PRN
  Filled 2018-07-04: qty 10

## 2018-07-04 MED ORDER — LACTATED RINGERS AMNIOINFUSION
INTRAVENOUS | Status: DC
  Administered 2018-07-04: 03:00:00 via INTRAUTERINE
  Filled 2018-07-04: qty 1000

## 2018-07-04 MED ORDER — WITCH HAZEL-GLYCERIN EX PADS: 1.0000 "application " | MEDICATED_PAD | CUTANEOUS | Status: DC | PRN

## 2018-07-04 MED ORDER — SENNOSIDES-DOCUSATE SODIUM 8.6-50 MG PO TABS
2.0000 | ORAL_TABLET | ORAL | Status: DC
  Administered 2018-07-04: 2 via ORAL
  Filled 2018-07-04: qty 2

## 2018-07-04 MED ORDER — ONDANSETRON HCL 4 MG PO TABS: 4.0000 mg | ORAL_TABLET | ORAL | Status: DC | PRN

## 2018-07-04 MED ORDER — SIMETHICONE 80 MG PO CHEW: 80.0000 mg | CHEWABLE_TABLET | ORAL | Status: DC | PRN

## 2018-07-04 MED ORDER — DIPHENHYDRAMINE HCL 25 MG PO CAPS: 25.0000 mg | ORAL_CAPSULE | Freq: Four times a day (QID) | ORAL | Status: DC | PRN

## 2018-07-04 MED ORDER — COCONUT OIL OIL: 1.0000 "application " | TOPICAL_OIL | Status: DC | PRN

## 2018-07-04 MED ORDER — TETANUS-DIPHTH-ACELL PERTUSSIS 5-2.5-18.5 LF-MCG/0.5 IM SUSP: 0.5000 mL | Freq: Once | INTRAMUSCULAR | Status: DC

## 2018-07-04 MED ORDER — LACTATED RINGERS IV SOLN
INTRAVENOUS | Status: DC
  Administered 2018-07-04: 14:00:00 via INTRAVENOUS

## 2018-07-04 MED ORDER — OXYCODONE-ACETAMINOPHEN 5-325 MG PO TABS
1.0000 | ORAL_TABLET | Freq: Four times a day (QID) | ORAL | Status: DC | PRN
  Administered 2018-07-04 (×2): 1 via ORAL
  Filled 2018-07-04 (×2): qty 1

## 2018-07-04 MED ORDER — IBUPROFEN 600 MG PO TABS
600.0000 mg | ORAL_TABLET | Freq: Four times a day (QID) | ORAL | Status: DC
  Administered 2018-07-04 – 2018-07-06 (×10): 600 mg via ORAL
  Filled 2018-07-04 (×10): qty 1

## 2018-07-04 MED ORDER — PRENATAL MULTIVITAMIN CH
1.0000 | ORAL_TABLET | Freq: Every day | ORAL | Status: DC
  Administered 2018-07-04 – 2018-07-06 (×3): 1 via ORAL
  Filled 2018-07-04 (×4): qty 1

## 2018-07-04 MED ORDER — DIBUCAINE 1 % RE OINT: 1.0000 "application " | TOPICAL_OINTMENT | RECTAL | Status: DC | PRN

## 2018-07-04 MED ORDER — ACETAMINOPHEN 325 MG PO TABS
650.0000 mg | ORAL_TABLET | ORAL | Status: DC | PRN
  Administered 2018-07-04: 650 mg via ORAL
  Filled 2018-07-04: qty 2

## 2018-07-04 MED ORDER — BENZOCAINE-MENTHOL 20-0.5 % EX AERO
1.0000 "application " | INHALATION_SPRAY | CUTANEOUS | Status: DC | PRN
  Administered 2018-07-04: 1 via TOPICAL
  Filled 2018-07-04: qty 56

## 2018-07-04 MED ORDER — EPHEDRINE 5 MG/ML INJ
10.0000 mg | INTRAVENOUS | Status: DC | PRN
  Filled 2018-07-04: qty 2

## 2018-07-04 MED ORDER — MISOPROSTOL 200 MCG PO TABS
ORAL_TABLET | ORAL | Status: AC
  Administered 2018-07-04: 200 ug
  Filled 2018-07-04: qty 4

## 2018-07-04 MED ORDER — ZOLPIDEM TARTRATE 5 MG PO TABS: 5.0000 mg | ORAL_TABLET | Freq: Every evening | ORAL | Status: DC | PRN

## 2018-07-04 MED ORDER — ONDANSETRON HCL 4 MG/2ML IJ SOLN: 4.0000 mg | INTRAMUSCULAR | Status: DC | PRN

## 2018-07-04 MED ORDER — LIDOCAINE HCL (PF) 1 % IJ SOLN
INTRAMUSCULAR | Status: DC | PRN
  Administered 2018-07-04: 3 mL via EPIDURAL
  Administered 2018-07-04: 2 mL via EPIDURAL
  Administered 2018-07-04: 5 mL via EPIDURAL

## 2018-07-04 NOTE — Anesthesia Procedure Notes (Signed)
Epidural Patient location during procedure: OB Start time: 07/04/2018 1:26 AM End time: 07/04/2018 1:33 AM  Staffing Anesthesiologist: Marcene DuosFitzgerald, Terrel Manalo, MD Performed: anesthesiologist   Preanesthetic Checklist Completed: patient identified, site marked, surgical consent, pre-op evaluation, timeout performed, IV checked, risks and benefits discussed and monitors and equipment checked  Epidural Patient position: sitting Prep: site prepped and draped and DuraPrep Patient monitoring: continuous pulse ox and blood pressure Approach: midline Location: L4-L5 Injection technique: LOR air  Needle:  Needle type: Tuohy  Needle gauge: 17 G Needle length: 9 cm and 9 Needle insertion depth: 8 cm Catheter type: closed end flexible Catheter size: 19 Gauge Catheter at skin depth: 15 (14-->15cm when laid in lat decub position) cm Test dose: negative  Assessment Events: blood not aspirated, injection not painful, no injection resistance, negative IV test and no paresthesia

## 2018-07-04 NOTE — Progress Notes (Signed)
LABOR PROGRESS NOTE  Brianna Booth is a 32 y.o. [id number] at 5446w2d  admitted for IOL for severe PEC   Subjective: Patient comfortable with epidural   Objective: BP (!) 146/85   Pulse 67   Temp 98.1 F (36.7 C) (Axillary)   Resp 16   Ht 5\' 7"  (1.702 m)   Wt 115.8 kg   LMP 11/12/2017 (Within Weeks)   SpO2 97%   BMI 40.00 kg/m  or   07/04/18 0205 07/04/18 0230  BP: (!) 155/88 133/75  Pulse: 73 71  Resp: 14 16  Temp:    TempSrc:    SpO2:    Weight:    Height:      AROM @0230 - clear with blood tinge fluid, IUPC and FSE placed   Dilation: 5 Effacement (%): 70 Station: -2 Presentation: Vertex Exam by:: Lanice ShirtsV Keishana Klinger CNM  FHT: baseline rate 145, moderate varibility, +accel, variable decel Toco: 1-3 minutes/ moderate by palpation   Labs: Lab Results  Component Value Date   WBC 9.0 07/03/2018   HGB 11.0 (L) 07/03/2018   HCT 34.0 (L) 07/03/2018   MCV 89.7 07/03/2018   PLT 173 07/03/2018    Patient Active Problem List   Diagnosis Date Noted  . SVD (spontaneous vaginal delivery) 07/04/2018  . Gestational diabetes mellitus (GDM) in third trimester 07/01/2018  . Gestational hypertension 07/01/2018  . History of preterm delivery, currently pregnant 06/12/2018  . Anxiety and depression 06/12/2018  . Supervision of other normal pregnancy, antepartum 04/07/2018  . Hx of preeclampsia, prior pregnancy, currently pregnant, second trimester 02/23/2018  . Bell's palsy 03/05/2017    Assessment / Plan: 32 y.o. [id number] at 2346w2d here for IOL for severe PEC   Labor: Progressing well, continue pitocin augmentation, IUPC and FSE placed  Fetal Wellbeing:  Cat II- Amnioinfusion initiated  Pain Control:  Epidural Anticipated MOD:  SVD  Sharyon Cableogers, Davidjames Blansett C, CNM, 07/04/2018, 2:50 AM

## 2018-07-04 NOTE — Anesthesia Postprocedure Evaluation (Signed)
Anesthesia Post Note  Patient: Brianna Booth  Procedure(s) Performed: AN AD HOC LABOR EPIDURAL     Patient location during evaluation: [facility] Anesthesia Type: Epidural Level of consciousness: awake, awake and alert and oriented Pain management: pain level controlled Vital Signs Assessment: post-procedure vital signs reviewed and stable Respiratory status: spontaneous breathing and nonlabored ventilation Cardiovascular status: stable and blood pressure returned to baseline (chronic HTN) Postop Assessment: no headache, no backache, epidural receding, able to ambulate, adequate PO intake and no apparent nausea or vomiting Anesthetic complications: no    Last Vitals:  Vitals:   07/04/18 1300 07/04/18 1323  BP:  (!) 142/94  Pulse:  84  Resp: 18 18  Temp:  36.8 C  SpO2:  99%    Last Pain:  Vitals:   07/04/18 1323  TempSrc: Oral  PainSc:    Pain Goal: Patients Stated Pain Goal: 4 (07/04/18 1300)               Jennelle HumanLisa B Betzaira Mentel

## 2018-07-04 NOTE — Anesthesia Preprocedure Evaluation (Signed)
Anesthesia Evaluation  Patient identified by MRN, date of birth, ID band Patient awake    Reviewed: Allergy & Precautions, Patient's Chart, lab work & pertinent test results  Airway Mallampati: III  TM Distance: >3 FB     Dental   Pulmonary asthma , former smoker,    breath sounds clear to auscultation       Cardiovascular hypertension,  Rhythm:Regular Rate:Normal     Neuro/Psych negative neurological ROS     GI/Hepatic negative GI ROS, Neg liver ROS,   Endo/Other  diabetes  Renal/GU negative Renal ROS     Musculoskeletal   Abdominal   Peds  Hematology negative hematology ROS (+)   Anesthesia Other Findings   Reproductive/Obstetrics (+) Pregnancy                             Lab Results  Component Value Date   WBC 9.0 07/03/2018   HGB 11.0 (L) 07/03/2018   HCT 34.0 (L) 07/03/2018   MCV 89.7 07/03/2018   PLT 173 07/03/2018    Anesthesia Physical Anesthesia Plan  ASA: III  Anesthesia Plan: Epidural   Post-op Pain Management:    Induction:   PONV Risk Score and Plan: Treatment may vary due to age or medical condition  Airway Management Planned: Natural Airway  Additional Equipment:   Intra-op Plan:   Post-operative Plan:   Informed Consent: I have reviewed the patients History and Physical, chart, labs and discussed the procedure including the risks, benefits and alternatives for the proposed anesthesia with the patient or authorized representative who has indicated his/her understanding and acceptance.     Plan Discussed with:   Anesthesia Plan Comments:         Anesthesia Quick Evaluation

## 2018-07-05 DIAGNOSIS — Z3A36 36 weeks gestation of pregnancy: Secondary | ICD-10-CM

## 2018-07-05 DIAGNOSIS — O134 Gestational [pregnancy-induced] hypertension without significant proteinuria, complicating childbirth: Secondary | ICD-10-CM

## 2018-07-05 DIAGNOSIS — O368330 Maternal care for abnormalities of the fetal heart rate or rhythm, third trimester, not applicable or unspecified: Secondary | ICD-10-CM

## 2018-07-05 DIAGNOSIS — O2662 Liver and biliary tract disorders in childbirth: Secondary | ICD-10-CM

## 2018-07-05 DIAGNOSIS — O09213 Supervision of pregnancy with history of pre-term labor, third trimester: Secondary | ICD-10-CM

## 2018-07-05 MED ORDER — NIFEDIPINE ER OSMOTIC RELEASE 30 MG PO TB24
30.0000 mg | ORAL_TABLET | Freq: Every day | ORAL | Status: DC
  Administered 2018-07-05 – 2018-07-06 (×2): 30 mg via ORAL
  Filled 2018-07-05 (×2): qty 1

## 2018-07-05 NOTE — Lactation Note (Addendum)
Lactation Consultation Note  Patient Name: Brianna Booth ZOXWR'UToday's Date: 07/05/2018  Mom reports infant will not latch.  Mom reports she tried to pump 1 time and did not get anything. Mom reports [facility] is supposed to come by today and give her formula.   Mom distracted because child lost car keys.  Sister feeding infant formula on arrival.  And both on their cell phones.  Offered to assist with breastfeeding.  Mom reports she has tried to breastfeed and pump just wants to feed formula right now.  Urged mom to call for breastfeeding assistance.  Urged mom to pump every time she gives a bottle.  Urged to add massage and hand expression to pumping since she was late preterm.  Did not get breastfeeding history at this time.  Will follow up with lactation as needed.   Maternal Data    Feeding    LATCH Score                   Interventions    Lactation Tools Discussed/Used     Consult Status      Forest Pruden Michaelle CopasS Halena Mohar 07/05/2018, 12:11 PM

## 2018-07-05 NOTE — Progress Notes (Signed)
Daily Postpartum Note  Admission Date: 07/01/2018 Current Date: 07/05/2018 10:12 AM  Brianna Booth is a 32 y.o. [id number] PPD#1 s/p SVD/IP @ 5972w2d due to IOL for pre-eclampsia for severe features (neuro s/s)  Pregnancy complicated by: Patient Active Problem List   Diagnosis Date Noted  . SVD (spontaneous vaginal delivery) 07/04/2018  . Gestational diabetes mellitus (GDM) in third trimester 07/01/2018  . Gestational hypertension 07/01/2018  . History of preterm delivery, currently pregnant 06/12/2018  . Anxiety and depression 06/12/2018  . Supervision of other normal pregnancy, antepartum 04/07/2018  . Hx of preeclampsia, prior pregnancy, currently pregnant, second trimester 02/23/2018  . Bell's palsy 03/05/2017    Overnight/24hr events:  Came off Mg at approx 0400 today  Subjective:  No s/s of pre-eclampsia.   Objective:    Current Vital Signs 24h Vital Sign Ranges  T 97.8 F (36.6 C) Temp  Avg: 98.2 F (36.8 C)  Min: 97.8 F (36.6 C)  Max: 98.5 F (36.9 C)  BP (pt sleeping) BP  Min: 116/65  Max: 142/94  HR 75 Pulse  Avg: 82.4  Min: 75  Max: 91  RR 16 Resp  Avg: 18.5  Min: 16  Max: 20  SaO2 100 % Room Air SpO2  Avg: 98.4 %  Min: 94 %  Max: 100 %       24 Hour I/O Current Shift I/O  Time Ins Outs 12/16 0701 - 12/17 0700 In: 4733.8 [P.O.:2970; I.V.:1763.8] Out: 7412 [Urine:7350] 12/17 0701 - 12/17 1900 In: -  Out: 900 [Urine:900]   Patient Vitals for the past 24 hrs:  BP Temp Temp src Pulse Resp SpO2  07/05/18 0358 116/65 97.8 F (36.6 C) Oral 75 16 100 %  07/04/18 2343 (!) 139/101 98 F (36.7 C) Oral 91 20 99 %  07/04/18 1954 137/85 98.4 F (36.9 C) Oral 86 20 94 %  07/04/18 1800 - - - - 18 -  07/04/18 1714 - - - - 20 -  07/04/18 1606 127/72 98.5 F (36.9 C) Oral 76 18 100 %  07/04/18 1500 - - - - 18 -  07/04/18 1400 - - - - 20 -  07/04/18 1323 (!) 142/94 98.3 F (36.8 C) Oral 84 18 99 %  07/04/18 1300 - - - - 18 -  07/04/18 1209 - - - - 18 -  07/04/18  1100 - - - - 18 -    Physical exam: General: Well nourished, well developed female in no acute distress. Abdomen: nttp, firm fundus below the umbilicus Cardiovascular: S1, S2 normal, no murmur, rub or gallop, regular rate and rhythm Respiratory: CTAB Extremities: no clubbing, cyanosis or edema Skin: Warm and dry.   Medications: Current Facility-Administered Medications  Medication Dose Route Frequency Provider Last Rate Last Dose  . acetaminophen (TYLENOL) tablet 650 mg  650 mg Oral Q4H PRN Sharyon CableRogers, Veronica C, CNM   650 mg at 07/04/18 1209  . albuterol (PROVENTIL) (2.5 MG/3ML) 0.083% nebulizer solution 3 mL  3 mL Inhalation Q4H PRN Sharyon Cableogers, Veronica C, CNM      . benzocaine-Menthol (DERMOPLAST) 20-0.5 % topical spray 1 application  1 application Topical PRN Sharyon Cableogers, Veronica C, CNM   1 application at 07/04/18 1712  . coconut oil  1 application Topical PRN Sharyon Cableogers, Veronica C, CNM      . witch hazel-glycerin (TUCKS) pad 1 application  1 application Topical PRN Sharyon Cableogers, Veronica C, CNM       And  . dibucaine (NUPERCAINAL) 1 % rectal ointment 1  application  1 application Rectal PRN Sharyon Cable, CNM      . diphenhydrAMINE (BENADRYL) capsule 25 mg  25 mg Oral Q6H PRN Sharyon Cable, CNM      . labetalol (NORMODYNE,TRANDATE) injection 20 mg  20 mg Intravenous PRN Steward Drone C, CNM   20 mg at 07/04/18 0600   And  . labetalol (NORMODYNE,TRANDATE) injection 40 mg  40 mg Intravenous PRN Sharyon Cable, CNM       And  . labetalol (NORMODYNE,TRANDATE) injection 80 mg  80 mg Intravenous PRN Steward Drone C, CNM       And  . hydrALAZINE (APRESOLINE) injection 10 mg  10 mg Intravenous PRN Sharyon Cable, CNM      . ibuprofen (ADVIL,MOTRIN) tablet 600 mg  600 mg Oral Q6H Steward Drone C, CNM   600 mg at 07/05/18 1610  . lactated ringers infusion   Intravenous Continuous Atmore Bing, MD 100 mL/hr at 07/04/18 1700    . montelukast (SINGULAIR) tablet 10 mg  10 mg Oral QHS  Sharyon Cable, CNM   10 mg at 07/04/18 2109  . ondansetron (ZOFRAN) tablet 4 mg  4 mg Oral Q4H PRN Sharyon Cable, CNM       Or  . ondansetron [facility]) injection 4 mg  4 mg Intravenous Q4H PRN Sharyon Cable, CNM      . oxyCODONE-acetaminophen (PERCOCET/ROXICET) 5-325 MG per tablet 1-2 tablet  1-2 tablet Oral Q6H PRN Hermina Staggers, MD   1 tablet at 07/04/18 2327  . prenatal multivitamin tablet 1 tablet  1 tablet Oral Q1200 Sharyon Cable, CNM   1 tablet at 07/04/18 1312  . senna-docusate (Senokot-S) tablet 2 tablet  2 tablet Oral Q24H Sharyon Cable, CNM   2 tablet at 07/04/18 2307  . simethicone (MYLICON) chewable tablet 80 mg  80 mg Oral PRN Sharyon Cable, CNM      . Tdap (BOOSTRIX) injection 0.5 mL  0.5 mL Intramuscular Once Sharyon Cable, CNM      . zolpidem (AMBIEN) tablet 5 mg  5 mg Oral QHS PRN Sharyon Cable, CNM        Labs:  Recent Labs  Lab 07/03/18 1336 07/03/18 2243 07/04/18 0658  WBC 8.3 9.0 11.3*  HGB 10.9* 11.0* 10.9*  HCT 33.9* 34.0* 33.6*  PLT 175 173 160    Recent Labs  Lab 07/01/18 1643 07/03/18 1336 07/04/18 0658  NA 135 135 134*  K 3.8 3.4* 3.1*  CL 106 106 105  CO2 21* 23 21*  BUN 7 6 <5*  CREATININE 0.39* 0.57 0.42*  CALCIUM 8.7* 8.7* 7.8*  PROT 6.5 6.3* 6.2*  BILITOT 0.3 0.5 0.6  ALKPHOS 103 101 94  ALT 9 9 9   AST 16 15 20   GLUCOSE 98 89 137*    Radiology: no new imaging  Assessment & Plan:  Pt doing well *PP: routine care. Breast and formula. Thinking about BC options. O pos *Severe pre-eclampsia: no current s/s. Watch BPs. Doing well on no meds *GDMa1: no issues *PPx: SCDs, OOB ad lib *FEN/GI: regular diet. SLIV *Dispo: likely tomorrow. Will need 1wk bp check  Cornelia Copa MD Attending Center for [facility])

## 2018-07-05 NOTE — Care Management Important Message (Signed)
Important Message  Patient Details  Name: Brianna Booth MRN: [id number] Date of Birth: 01-15-1986   Medicare Important Message Given:  Yes    Renie OraHawkins, Dymin Dingledine Smith 07/05/2018, 10:29 AM

## 2018-07-06 ENCOUNTER — Encounter: Payer: Medicare Other | Admitting: Obstetrics and Gynecology

## 2018-07-06 DIAGNOSIS — O368330 Maternal care for abnormalities of the fetal heart rate or rhythm, third trimester, not applicable or unspecified: Secondary | ICD-10-CM

## 2018-07-06 DIAGNOSIS — Z3A36 36 weeks gestation of pregnancy: Secondary | ICD-10-CM

## 2018-07-06 DIAGNOSIS — O2662 Liver and biliary tract disorders in childbirth: Secondary | ICD-10-CM

## 2018-07-06 DIAGNOSIS — O09213 Supervision of pregnancy with history of pre-term labor, third trimester: Secondary | ICD-10-CM

## 2018-07-06 DIAGNOSIS — O134 Gestational [pregnancy-induced] hypertension without significant proteinuria, complicating childbirth: Secondary | ICD-10-CM

## 2018-07-06 MED ORDER — IBUPROFEN 600 MG PO TABS: 600.0000 mg | ORAL_TABLET | Freq: Four times a day (QID) | ORAL | 0 refills | Status: DC

## 2018-07-06 MED ORDER — NIFEDIPINE ER 30 MG PO TB24: 30.0000 mg | ORAL_TABLET | Freq: Every day | ORAL | 0 refills | Status: DC

## 2018-07-06 NOTE — Discharge Instructions (Signed)
Vaginal Delivery, Care After Refer to this sheet in the next few weeks. These discharge instructions provide you with information on caring for yourself after delivery. Your caregiver may also give you specific instructions. Your treatment has been planned according to the most current medical practices available, but problems sometimes occur. Call your caregiver if you have any problems or questions after you go home. HOME CARE INSTRUCTIONS 1. Take over-the-counter or prescription medicines only as directed by your caregiver or pharmacist. 2. Do not drink alcohol, especially if you are breastfeeding or taking medicine to relieve pain. 3. Do not smoke tobacco. 4. Continue to use good perineal care. Good perineal care includes: 1. Wiping your perineum from back to front 2. Keeping your perineum clean. 3. You can do sitz baths twice a day, to help keep this area clean 5. Do not use tampons, douche or have sex until your caregiver says it is okay. 6. Shower only and avoid sitting in submerged water, aside from sitz baths 7. Wear a well-fitting bra that provides breast support. 8. Eat healthy foods. 9. Drink enough fluids to keep your urine clear or pale yellow. 10. Eat high-fiber foods such as whole grain cereals and breads, brown rice, beans, and fresh fruits and vegetables every day. These foods may help prevent or relieve constipation. 11. Avoid constipation with high fiber foods or medications, such as miralax or metamucil 12. Follow your caregiver's recommendations regarding resumption of activities such as climbing stairs, driving, lifting, exercising, or traveling. 13. Talk to your caregiver about resuming sexual activities. Resumption of sexual activities is dependent upon your risk of infection, your rate of healing, and your comfort and desire to resume sexual activity. 14. Try to have someone help you with your household activities and your newborn for at least a few days after you leave  the hospital. 15. Rest as much as possible. Try to rest or take a nap when your newborn is sleeping. 16. Increase your activities gradually. 17. Keep all of your scheduled postpartum appointments. It is very important to keep your scheduled follow-up appointments. At these appointments, your caregiver will be checking to make sure that you are healing physically and emotionally. SEEK MEDICAL CARE IF:   You are passing large clots from your vagina. Save any clots to show your caregiver.  You have a foul smelling discharge from your vagina.  You have trouble urinating.  You are urinating frequently.  You have pain when you urinate.  You have a change in your bowel movements.  You have increasing redness, pain, or swelling near your vaginal incision (episiotomy) or vaginal tear.  You have pus draining from your episiotomy or vaginal tear.  Your episiotomy or vaginal tear is separating.  You have painful, hard, or reddened breasts.  You have a severe headache.  You have blurred vision or see spots.  You feel sad or depressed.  You have thoughts of hurting yourself or your newborn.  You have questions about your care, the care of your newborn, or medicines.  You are dizzy or light-headed.  You have a rash.  You have nausea or vomiting.  You were breastfeeding and have not had a menstrual period within 12 weeks after you stopped breastfeeding.  You are not breastfeeding and have not had a menstrual period by the 12th week after delivery.  You have a fever. SEEK IMMEDIATE MEDICAL CARE IF:   You have persistent pain.  You have chest pain.  You have shortness of breath.    You faint.  You have leg pain.  You have stomach pain.  Your vaginal bleeding saturates two or more sanitary pads in 1 hour. MAKE SURE YOU:   Understand these instructions.  Will watch your condition.  Will get help right away if you are not doing well or get worse. Document Released:  07/03/2000 Document Revised: 11/20/2013 Document Reviewed: 03/02/2012 [facility] Patient Information 2015 [place], [place]. This information is not intended to replace advice given to you by your health care provider. Make sure you discuss any questions you have with your health care provider.  Sitz Bath A sitz bath is a warm water bath taken in the sitting position. The water covers only the hips and butt (buttocks). We recommend using one that fits in the toilet, to help with ease of use and cleanliness. It may be used for either healing or cleaning purposes. Sitz baths are also used to relieve pain, itching, or muscle tightening (spasms). The water may contain medicine. Moist heat will help you heal and relax.  HOME CARE  Take 3 to 4 sitz baths a day. 18. Fill the bathtub half-full with warm water. 19. Sit in the water and open the drain a little. 20. Turn on the warm water to keep the tub half-full. Keep the water running constantly. 21. Soak in the water for 15 to 20 minutes. 22. After the sitz bath, pat the affected area dry. GET HELP RIGHT AWAY IF: You get worse instead of better. Stop the sitz baths if you get worse. MAKE SURE YOU:  Understand these instructions.  Will watch your condition.  Will get help right away if you are not doing well or get worse. Document Released: 08/13/2004 Document Revised: 03/30/2012 Document Reviewed: 11/03/2010 [facility] Patient Information 2015 [place], [place]. This information is not intended to replace advice given to you by your health care provider. Make sure you discuss any questions you have with your health care provider.    Hypertension During Pregnancy Contact a doctor if:  You have symptoms that your doctor told you to watch for, such as: ? Throwing up (vomiting). ? Feeling sick to your stomach (nausea). ? Headache. Get help right away if you have:  Very bad belly pain that does not get better with treatment.  A very bad headache  that does not get better.  Throwing up that does not get better with treatment.  Sudden, fast weight gain.  Sudden swelling in your hands, ankles, or face.  Bleeding from your vagina.  Blood in your pee.  Fewer movements from your baby than usual.  Blurry vision.  Double vision.  Muscle twitching.  Sudden muscle tightening (spasms).  Trouble breathing.  Blue fingernails or lips. Summary  Hypertension is also called high blood pressure. High blood pressure means that the force of your blood moving in your body is too strong.  When you are pregnant, this condition should be watched carefully. It can cause problems for you and your baby.  Get help right away if you have symptoms that your doctor told you to watch for. This information is not intended to replace advice given to you by your health care provider. Make sure you discuss any questions you have with your health care provider. Document Released: 08/08/2010 Document Revised: 06/22/2017 Document Reviewed: 03/17/2016 Elsevier Interactive Patient Education  2019 [employer]

## 2018-07-06 NOTE — Discharge Summary (Signed)
Obstetrical Discharge Summary  Date of Admission: 07/01/2018 Date of Discharge: 07/06/2018  Primary [facility]: [facility]  Gestational Age at Delivery: 510w2d   Antepartum complications: GDMa1, GHTN, anxiety/depression, history of pre-eclampsia, history of preterm birth Reason for Admission: BPP 4/10 Date of Delivery: 07/04/18  Delivered By: Steward DroneVeronica Rogers, CNM Delivery Type: spontaneous vaginal delivery Intrapartum complications/course: During antepartum surveillance, patient diagnosed with severe pre-eclampsia based on neuro features Anesthesia: epidural Placenta: Delivered and expressed via active management. Intact: yes. To pathology: no.  Laceration: none Episiotomy: none EBL: 463mL Baby: Liveborn female, APGARs 8/8, weight 2980 g.    Discharge Diagnosis: Delivered.  Postpartum course: Uncomplicated. She received 24 hours of PP Mg. Patient was started on procardia xl during this period and did well.  Discharge Vital Signs:  Current Vital Signs 24h Vital Sign Ranges  T 97.9 F (36.6 C) Temp  Avg: 98.3 F (36.8 C)  Min: 97.7 F (36.5 C)  Max: 98.9 F (37.2 C)  BP 136/84 BP  Min: 115/72  Max: 167/97  HR 79 Pulse  Avg: 82.4  Min: 78  Max: 87  RR 17 Resp  Avg: 18.6  Min: 17  Max: 20  SaO2 100 % Room Air SpO2  Avg: 98.4 %  Min: 95 %  Max: 100 %       24 Hour I/O Current Shift I/O  Time Ins Outs 12/17 0701 - 12/18 0700 In: 1320 [P.O.:1320] Out: 2700 [Urine:2700] No intake/output data recorded.   Discharge Exam:  NAD Perineum: deferred Abdomen: firm fundus below the umbilicus, NTTP, non distended, +bowel sounds. RRR no MRGs CTAB Ext: no c/c/e  Recent Labs  Lab 07/03/18 1336 07/03/18 2243 07/04/18 0658  WBC 8.3 9.0 11.3*  HGB 10.9* 11.0* 10.9*  HCT 33.9* 34.0* 33.6*  PLT 175 173 160    Disposition: Home  Rh Immune globulin given: not applicable Rubella vaccine given: not applicable Tdap vaccine given in AP or PP setting: yes Flu vaccine  given in AP or PP setting: declined  Contraception: Nexplanon?  Prenatal/Postnatal Panel: O POS [facility], [address] [place], [place] [address] Ishmael Holter//Rubella Immune//Varicella Unknown//RPR negative//HIV negative/HepB Surface Ag negative//pap no abnormalities (date: 2019)//Formula  Plan:  Brianna Booth was discharged to home in good condition. [facility] in 1 week for a BP visit  Future Appointments  Date Time Provider Department Center  08/16/2018  2:35 PM Gwenevere AbbotPhillip, Nimeka, MD [facility]  07/15/2018  Discharge Medications: Allergies as of 07/06/2018   No Known Allergies     Medication List    TAKE these medications   acetaminophen 500 MG tablet Commonly known as:  TYLENOL Take 1,000 mg by mouth every 6 (six) hours as needed for moderate pain.   albuterol 108 (90 Base) MCG/ACT inhaler Commonly known as:  PROVENTIL HFA;VENTOLIN HFA Inhale 1-2 puffs into the lungs every 6 (six) hours as needed for wheezing or shortness of breath.   alprazolam 2 MG tablet Commonly known as:  XANAX Take 2 mg by mouth 4 (four) times daily as needed for anxiety.   ibuprofen 600 MG tablet Commonly known as:  ADVIL,MOTRIN Take 1 tablet (600 mg total) by mouth every 6 (six) hours.   NIFEdipine 30 MG 24 hr tablet Commonly known as:  ADALAT CC Take 1 tablet (30 mg total) by mouth daily. Start taking on:  July 07, 2018   Prenatal Vitamins 0.8 MG tablet Take 1 tablet by mouth daily.  Durene Romans MD Attending Center for [facility])

## 2018-07-06 NOTE — Clinical Social Work Maternal (Signed)
CLINICAL SOCIAL WORK MATERNAL/CHILD NOTE  Patient Details  Name: Brianna Booth MRN: [id number] Date of Birth: 02/07/1986  Date:  06/03/2018  Clinical Social Worker Initiating Note:  Laurey Arrow Date/Time: Initiated:  07/06/18/1006     Child's Name:  Brianna Booth   Biological Parents:  Mother(Per MOB, FOB is not involved and MOB declined to provide any information to CSW. )   Need for Interpreter:  None   Reason for Referral:  Behavioral Health Concerns, Late or No Prenatal Care    Address:  [place] [address]    Phone number:  [phone] (home)     Additional phone number:  Household Members/Support Persons (HM/SP):   Household Member/Support Person 1, Household Member/Support Person 3, Household Member/Support Person 25, Household Member/Support Person 84, Household Member/Support Person 2, Household Member/Support Person 4, Household Member/Support Person 6   HM/SP Name Relationship DOB or Age  HM/SP -1 Addisynn Vassell daughter 29 years old  HM/SP -2 Zy'Aira Putnam daughter 54 years of age  HM/SP -31 [place] son 56 years of age  HM/SP -41 Ricke Hey daughter age 13  HM/SP -[place] son age 33  HM/SP -46 Zerenity Lovena Le Daughter age 89  HM/SP -7 Lorri Frederick Miranda daughter age 1  HM/SP -51          Natural Supports (not living in the home):  Extended Family, Immediate Family, Friends   Professional Supports: Case Manager/Social Worker(MOB's OBCM is Barrister's clerk (last name is unkown).)   Employment: Disabled   Type of Work:     Education:  9 to 11 years   Homebound arranged:    Museum/gallery curator Resources:  Medicaid, [employer] , Community education officer   Other Resources:  [employer], Physicist, medical    Cultural/Religious Considerations Which May Impact Care:  None Reported Strengths:  Ability to meet basic needs , Compliance with medical plan , Home prepared for child , Understanding of illness, Pediatrician chosen   Psychotropic Medications:          Pediatrician:    Solicitor area  Pediatrician List:   [facility] ([address])  [place]      Pediatrician Fax Number:    Risk Factors/Current Problems:  Transportation , Mental Health Concerns    Cognitive State:  Able to Concentrate , Alert , Linear Thinking , Insightful    Mood/Affect:  Happy , Interested , Comfortable , Calm    CSW Assessment: CSW met with MOB in room 309 to complete an assessment for MH hx, Edinburgh score or 14, and limited PNC.  When CSW arrived, MOB was resting in bed and MOB's sister, Clydia Llano was holding infant. CSW explained CSW's role and MOB gave CSW permission to complete the assessment while MOB's sister was present. MOB was polite, easy to engage, and receptive to meeting with CSW.  MOB's sister also contributed to the assessment.   CSW reviewed MOB's Edinburgh score and inquired about MOB's MH.  MOB reported, "I was dx with depression and anxiety years ago and I meet with my psychiatrist every 2 months (MOB psychiatrist is in [place])."  MOB reported that MOB last amount was last week and MOB's psychiatrist discontinue several of her medications however she is still currently taking her Xanax. CSW provided education regarding the baby blues period vs. perinatal mood disorders, discussed treatment and gave resources  for mental health follow up if concerns arise.  CSW recommends self-evaluation during the postpartum time period using the New Mom Checklist from Postpartum Progress and encouraged MOB to contact a medical professional if symptoms are noted at any time.  MOB presented with insight and awareness and did not demonstrate any acute MH symptoms.  MOB reported feeling comfortable seeking help is help is warranted.  MOB also reported having a good support team that is aware of MOB's MH hx. CSW assessed for safety and MOB  denied SI, HI, and DV. Outpatient resources were offered to MOB for [place] and MOB declined.   CSW asked about MOB's limited PNC.  MOB reported not having reliable transportation and having childcare barriers.  CSW suggested that MOB apply for Medicaid Transportation and to schedule follow-up appointments for MOB and infant during the time that MOB's older children are at school; MOB agreed. MOB denied barriers for follow-up.   CSW informed MOB of hospital policy regarding limited PNC.  MOB declined the use of all illicit substance and reported having a Rx for Ambien, Xanax, and Valium. CSW made MOB aware that CSW will continue to monitor infant's UDS and CDS and will make a report to [place] CPS. MOB acknowledged having hx with CPS and reported that reports in the past were "open and closed with no concerns."   MOB reported having limited supplies for infant and requested `assistance.  Per MOB, MOB will receive a car seat from her OBCM however, MOB does not have a safe place for infant to sleep. CSW assisted MOB with obtaining a baby box, a bundle pack, and other essential items for infant; MOB was appreciative.  CSW provided review of Sudden Infant Death Syndrome (SIDS) precautions.    CSW thanked MOB for meeting with CSW.  There are no barriers to discharge.  CSW Plan/Description:  No Further Intervention Required/No Barriers to Discharge, Sudden Infant Death Syndrome (SIDS) Education, Perinatal Mood and Anxiety Disorder (PMADs) Education, Hospital Drug Screen Policy Information, Other Information/Referral to Community Resources, CSW Will Continue to Monitor Umbilical Cord Tissue Drug Screen Results and Make Report if Warranted   Cesily Cuoco Boyd-Gilyard, MSW, LCSW Clinical Social Work [phone]  Elvena Oyer D BOYD-GILYARD, LCSW 07/06/2018, 1:18 PM 

## 2018-07-07 ENCOUNTER — Other Ambulatory Visit: Payer: Medicare Other

## 2018-07-08 ENCOUNTER — Encounter (HOSPITAL_COMMUNITY): Payer: Self-pay

## 2018-07-08 ENCOUNTER — Ambulatory Visit (HOSPITAL_COMMUNITY): Admission: RE | Admit: 2018-07-08 | Payer: Medicare Other | Source: Ambulatory Visit

## 2018-07-14 ENCOUNTER — Encounter: Payer: Medicare Other | Admitting: Obstetrics and Gynecology

## 2018-07-15 ENCOUNTER — Ambulatory Visit: Payer: Medicare Other

## 2018-07-21 ENCOUNTER — Encounter: Payer: Medicare Other | Admitting: Obstetrics and Gynecology

## 2018-07-27 ENCOUNTER — Encounter: Payer: Medicare Other | Admitting: Obstetrics and Gynecology

## 2018-08-15 ENCOUNTER — Other Ambulatory Visit: Payer: Self-pay | Admitting: *Deleted

## 2018-08-15 DIAGNOSIS — Z8632 Personal history of gestational diabetes: Secondary | ICD-10-CM

## 2018-08-16 ENCOUNTER — Other Ambulatory Visit: Payer: Medicare Other

## 2018-08-16 ENCOUNTER — Encounter: Payer: Self-pay | Admitting: Student

## 2018-08-16 ENCOUNTER — Ambulatory Visit: Payer: Medicare Other | Admitting: Student

## 2018-08-16 ENCOUNTER — Ambulatory Visit: Payer: Medicare Other | Admitting: Family Medicine

## 2018-08-16 ENCOUNTER — Telehealth: Payer: Self-pay | Admitting: Student

## 2018-08-16 NOTE — Telephone Encounter (Signed)
Called the patient via both contact numbers available in Epic. Unable to reach the patient however a message was left on both lines. Also sending a missed appointment letter.

## 2019-02-16 ENCOUNTER — Ambulatory Visit: Payer: Medicare Other | Admitting: Obstetrics

## 2019-02-22 DIAGNOSIS — F3132 Bipolar disorder, current episode depressed, moderate: Secondary | ICD-10-CM | POA: Insufficient documentation

## 2019-02-22 DIAGNOSIS — F209 Schizophrenia, unspecified: Secondary | ICD-10-CM | POA: Insufficient documentation

## 2019-02-22 DIAGNOSIS — I10 Essential (primary) hypertension: Secondary | ICD-10-CM

## 2019-02-22 DIAGNOSIS — J4520 Mild intermittent asthma, uncomplicated: Secondary | ICD-10-CM | POA: Insufficient documentation

## 2019-02-22 HISTORY — DX: Essential (primary) hypertension: I10

## 2019-03-15 ENCOUNTER — Emergency Department (HOSPITAL_COMMUNITY)
Admission: EM | Admit: 2019-03-15 | Discharge: 2019-03-15 | Disposition: A | Discharge status: Home / Self Care | Payer: Medicare Other | Attending: Emergency Medicine | Admitting: Emergency Medicine

## 2019-03-15 ENCOUNTER — Encounter (HOSPITAL_COMMUNITY): Payer: Self-pay | Admitting: Emergency Medicine

## 2019-03-15 ENCOUNTER — Other Ambulatory Visit: Payer: Self-pay

## 2019-03-15 ENCOUNTER — Inpatient Hospital Stay (HOSPITAL_COMMUNITY)
Admission: AD | Admit: 2019-03-15 | Discharge: 2019-03-15 | Disposition: A | Discharge status: Home / Self Care | Payer: Medicare Other | Source: Home / Self Care | Attending: Obstetrics and Gynecology | Admitting: Obstetrics and Gynecology

## 2019-03-15 ENCOUNTER — Encounter (HOSPITAL_COMMUNITY): Payer: Self-pay | Admitting: *Deleted

## 2019-03-15 DIAGNOSIS — N390 Urinary tract infection, site not specified: Secondary | ICD-10-CM | POA: Diagnosis not present

## 2019-03-15 DIAGNOSIS — N939 Abnormal uterine and vaginal bleeding, unspecified: Secondary | ICD-10-CM

## 2019-03-15 DIAGNOSIS — R109 Unspecified abdominal pain: Secondary | ICD-10-CM

## 2019-03-15 DIAGNOSIS — J45909 Unspecified asthma, uncomplicated: Secondary | ICD-10-CM | POA: Insufficient documentation

## 2019-03-15 DIAGNOSIS — N72 Inflammatory disease of cervix uteri: Secondary | ICD-10-CM | POA: Insufficient documentation

## 2019-03-15 LAB — I-STAT BETA HCG BLOOD, ED (MC, WL, AP ONLY): I-stat hCG, quantitative: <5 m[IU]/mL (ref <5)

## 2019-03-15 LAB — CBC WITH DIFFERENTIAL/PLATELET
Abs Immature Granulocytes: 0.01 10*3/uL (ref 0.00–0.07)
Basophils Absolute: 0.1 10*3/uL (ref 0.0–0.1)
Basophils Relative: 1 %
Eosinophils Absolute: 0.2 10*3/uL (ref 0.0–0.5)
Eosinophils Relative: 3 %
HCT: 38.3 % (ref 36.0–46.0)
Hemoglobin: 12.4 g/dL (ref 12.0–15.0)
Immature Granulocytes: 0 %
Lymphocytes Relative: 35 %
Lymphs Abs: 3 10*3/uL (ref 0.7–4.0)
MCH: 28.6 pg (ref 26.0–34.0)
MCHC: 32.4 g/dL (ref 30.0–36.0)
MCV: 88.2 fL (ref 80.0–100.0)
Monocytes Absolute: 0.6 10*3/uL (ref 0.1–1.0)
Monocytes Relative: 7 %
Neutro Abs: 4.8 10*3/uL (ref 1.7–7.7)
Neutrophils Relative %: 54 %
Platelets: 247 10*3/uL (ref 150–400)
RBC: 4.34 MIL/uL (ref 3.87–5.11)
RDW: 14.4 % (ref 11.5–15.5)
WBC: 8.7 10*3/uL (ref 4.0–10.5)
nRBC: 0 % (ref 0.0–0.2)

## 2019-03-15 LAB — WET PREP, GENITAL
Clue Cells Wet Prep HPF POC: NONE SEEN
Sperm: NONE SEEN
Trich, Wet Prep: NONE SEEN
WBC, Wet Prep HPF POC: NONE SEEN
Yeast Wet Prep HPF POC: NONE SEEN

## 2019-03-15 LAB — URINALYSIS, ROUTINE W REFLEX MICROSCOPIC
Bilirubin Urine: NEGATIVE
Glucose, UA: NEGATIVE mg/dL
Ketones, ur: 5 mg/dL — AB
Nitrite: NEGATIVE
Protein, ur: 100 mg/dL — AB
RBC / HPF: >50 RBC/hpf — ABNORMAL HIGH (ref 0–5)
Specific Gravity, Urine: 1.026 (ref 1.005–1.030)
pH: 6 (ref 5.0–8.0)

## 2019-03-15 LAB — TYPE AND SCREEN
ABO/RH(D): O POS
Antibody Screen: NEGATIVE

## 2019-03-15 LAB — ABO/RH: ABO/RH(D): O POS

## 2019-03-15 MED ORDER — AZITHROMYCIN 250 MG PO TABS
1000.0000 mg | ORAL_TABLET | Freq: Once | ORAL | Status: AC
  Administered 2019-03-15: 1000 mg via ORAL
  Filled 2019-03-15: qty 4

## 2019-03-15 MED ORDER — DOXYCYCLINE HYCLATE 100 MG PO CAPS: 100.0000 mg | ORAL_CAPSULE | Freq: Two times a day (BID) | ORAL | 0 refills | Status: DC

## 2019-03-15 MED ORDER — STERILE WATER FOR INJECTION IJ SOLN
INTRAMUSCULAR | Status: AC
  Administered 2019-03-15: 0.9 mL
  Filled 2019-03-15: qty 10

## 2019-03-15 MED ORDER — CEFTRIAXONE SODIUM 250 MG IJ SOLR
250.0000 mg | Freq: Once | INTRAMUSCULAR | Status: AC
  Administered 2019-03-15: 23:00:00 250 mg via INTRAMUSCULAR
  Filled 2019-03-15: qty 250

## 2019-03-15 MED ORDER — IBUPROFEN 800 MG PO TABS
800.0000 mg | ORAL_TABLET | Freq: Once | ORAL | Status: AC
  Administered 2019-03-15: 23:00:00 800 mg via ORAL
  Filled 2019-03-15: qty 1

## 2019-03-15 MED ORDER — FLUCONAZOLE 150 MG PO TABS: 150.0000 mg | ORAL_TABLET | Freq: Once | ORAL | 0 refills | Status: AC

## 2019-03-15 NOTE — ED Notes (Signed)
Have pt call sister when available  

## 2019-03-15 NOTE — ED Notes (Signed)
Pt seen walking outside to parking lot

## 2019-03-15 NOTE — MAU Note (Signed)
.   Brianna Booth is a 33 y.o. at Unknown here in MAU reporting: lower back and abdominal pain with vaginal bleeding.   LMP: 03/15/19 Onset of complaint: weeks Pain score: 10 Vitals:   03/15/19 1645 03/15/19 1647  BP:  123/68  Pulse: (!) 105 97  Resp: 16   Temp: 98.4 F (36.9 C)   SpO2: 100%      Lab orders placed from triage: UPT

## 2019-03-15 NOTE — ED Notes (Signed)
Pt returned.

## 2019-03-15 NOTE — ED Notes (Signed)
Patient verbalizes understanding of discharge instructions. Opportunity for questioning and answers were provided. Armband removed by staff, pt discharged from ED ambulatory.   

## 2019-03-15 NOTE — Discharge Instructions (Signed)
Take antibiotic as prescribed.  Take diflucan if you develop yeast infection.  Return if you have any concerns.

## 2019-03-15 NOTE — ED Notes (Addendum)
Error

## 2019-03-15 NOTE — ED Notes (Signed)
Pt sister dropped off pt some pads and tampons which this tech give pt. Pt also give pt some underware and pads from stock.

## 2019-03-15 NOTE — MAU Provider Note (Signed)
  [name] is a 32 y.o. [id number] non-pregnant female who presents to MAU today with complaint of low back pain, abdominal pain, and vaginal bleeding.   O BP 123/68   Pulse 97   Temp 98.4 F (36.9 C)   Resp 16   LMP 03/15/2019   SpO2 100%  Physical Exam  Nursing note and vitals reviewed. Constitutional: She is oriented to person, place, and time. She appears well-developed and well-nourished.  HENT:  Head: Normocephalic and atraumatic.  Eyes: Pupils are equal, round, and reactive to light. EOM are normal.  Cardiovascular: Normal rate and regular rhythm.  Respiratory: Effort normal and breath sounds normal.  GI: Soft. Bowel sounds are normal.  Musculoskeletal: Normal range of motion.  Neurological: She is alert and oriented to person, place, and time.  Skin: Skin is warm and dry.  Psychiatric: She has a normal mood and affect. Her behavior is normal.    A Non pregnant female Medical screening exam complete Abdominal pain and vaginal bleeding  P Discharge from MAU in stable condition Patient given the option of transfer to [facility] for further evaluation or seek care in outpatient facility of choice. She declined outpatient evaluation and proceeded to walk to [facility] ED called and report was given to Dr. Rex Kras. List of options for follow-up given  Warning signs for worsening condition that would warrant emergency follow-up discussed Patient may return to MAU as needed for pregnancy related complaints  Giankarlo Leamer L, DO 03/15/2019 4:56 PM

## 2019-03-15 NOTE — ED Triage Notes (Signed)
Pt with vaginal bleeding and pelvic pain. She was seen at MAU where she tested negative pregnancy test. She also wants to be checked for STD today. Heavy bleeding for 3 days going through 6 sanitary napkins daily.

## 2019-03-15 NOTE — ED Provider Notes (Signed)
[facility] EMERGENCY DEPARTMENT Provider Note   CSN: [id number] Arrival date & time: 03/15/19  1702     History   Chief Complaint Chief Complaint  Patient presents with  . Vaginal Bleeding  . Exposure to STD    HPI Brianna Booth is a 33 y.o. female.     The history is provided by the patient. No language interpreter was used.  Vaginal Bleeding Exposure to STD     33 year old female presenting for evaluation of vaginal bleeding and pelvic pain.  Patient is a G 28 P8 female who endorse 3 days of vaginal discomfort, vaginal bleeding as well as vaginal discharge.  She endorsed burning sensation when urinate, itchiness to her vaginal region and states she is currently on her menstruation.  To go through 5-6 pads per day.  She admits to having new sexual partner and not using protection.  She denies having fever chills no nausea vomiting or diarrhea.  She denies any significant abdominal pain or back pain.  She would like to be tested for STD.  She report prior history of gonorrhea and chlamydia 11 years ago.  She is currently on her menstruation and it has been regular.  Her discomfort is mild to moderate, and described as irritation.  Past Medical History:  Diagnosis Date  . Anxiety   . Asthma   . Depression   . Headache   . Pregnancy induced hypertension   . Preterm labor     Patient Active Problem List   Diagnosis Date Noted  . SVD (spontaneous vaginal delivery) 07/04/2018  . Gestational diabetes mellitus (GDM) in third trimester 07/01/2018  . Gestational hypertension 07/01/2018  . History of preterm delivery, currently pregnant 06/12/2018  . Anxiety and depression 06/12/2018  . Supervision of other normal pregnancy, antepartum 04/07/2018  . Hx of preeclampsia, prior pregnancy, currently pregnant, second trimester 02/23/2018  . Bell's palsy 03/05/2017    Past Surgical History:  Procedure Laterality Date  . DILATION AND CURETTAGE OF UTERUS       OB History    Gravida  13   Para  8   Term  3   Preterm  5   AB  5   Living  8     SAB  3   TAB  2   Ectopic      Multiple  0   Live Births  8            Home Medications    Prior to Admission medications   Medication Sig Start Date End Date Taking? Authorizing Provider  acetaminophen (TYLENOL) 500 MG tablet Take 1,000 mg by mouth every 6 (six) hours as needed for moderate pain.    [provider]  albuterol (PROVENTIL HFA;VENTOLIN HFA) 108 (90 Base) MCG/ACT inhaler Inhale 1-2 puffs into the lungs every 6 (six) hours as needed for wheezing or shortness of breath.    [provider]  alprazolam Prudy Feeler) 2 MG tablet Take 2 mg by mouth 4 (four) times daily as needed for anxiety.    [provider]  ibuprofen (ADVIL,MOTRIN) 600 MG tablet Take 1 tablet (600 mg total) by mouth every 6 (six) hours. 07/06/18   [place] Bing, MD  NIFEdipine (ADALAT CC) 30 MG 24 hr tablet Take 1 tablet (30 mg total) by mouth daily. 07/07/18   Barnard Bing, MD  Prenatal Multivit-Min-Fe-FA (PRENATAL VITAMINS) 0.8 MG tablet Take 1 tablet by mouth daily. 04/07/18   Raelyn Mora, CNM    Family  History Family History  Problem Relation Age of Onset  . Hypertension Father     Social History Social History   Tobacco Use  . Smoking status: Former Smoker    Quit date: 12/12/2017    Years since quitting: 1.2  . Smokeless tobacco: Never Used  Substance Use Topics  . Alcohol use: Never    Frequency: Never  . Drug use: Never     Allergies   Patient has no known allergies.   Review of Systems Review of Systems  Genitourinary: Positive for vaginal bleeding.  All other systems reviewed and are negative.    Physical Exam Updated Vital Signs BP (!) 145/92 (BP Location: Left Arm)   Pulse 83   Temp 98.4 F (36.9 C) (Oral)   Resp 18   LMP 03/15/2019   SpO2 100%   Physical Exam Vitals signs and nursing note reviewed.  Constitutional:       General: She is not in acute distress.    Appearance: She is well-developed.  HENT:     Head: Atraumatic.  Eyes:     Conjunctiva/sclera: Conjunctivae normal.  Neck:     Musculoskeletal: Neck supple.  Genitourinary:    Comments: Chaperone present during exam.  No inguinal lymphadenopathy or inguinal hernia noted.  Normal external genitalia.  Mild discomfort with speculum insertion.  Moderate amount of blood noted in vaginal vault without significant vaginal discharge or odor.  Cervical os is closed.  On bimanual examination, right adnexal tenderness with cervical motion tenderness Skin:    Findings: No rash.  Neurological:     Mental Status: She is alert.      ED Treatments / Results  Labs (all labs ordered are listed, but only abnormal results are displayed) Labs Reviewed  URINALYSIS, ROUTINE W REFLEX MICROSCOPIC - Abnormal; Notable for the following components:      Result Value   Color, Urine AMBER (*)    APPearance CLOUDY (*)    Hgb urine dipstick LARGE (*)    Ketones, ur 5 (*)    Protein, ur 100 (*)    Leukocytes,Ua SMALL (*)    RBC / HPF >50 (*)    Bacteria, UA RARE (*)    All other components within normal limits  WET PREP, GENITAL  URINE CULTURE  CBC WITH DIFFERENTIAL/PLATELET  RPR  HIV ANTIBODY (ROUTINE TESTING W REFLEX)  I-STAT BETA HCG BLOOD, ED ([facility], [facility], [facility] ONLY)  TYPE AND SCREEN  ABO/RH  GC/CHLAMYDIA PROBE AMP ([place]) NOT AT [facility]    EKG None  Radiology No results found.  Procedures Procedures (including critical care time)  Medications Ordered in ED Medications  ibuprofen (ADVIL) tablet 800 mg (800 mg Oral Given 03/15/19 2254)  cefTRIAXone (ROCEPHIN) injection 250 mg (250 mg Intramuscular Given 03/15/19 2258)  azithromycin (ZITHROMAX) tablet 1,000 mg (1,000 mg Oral Given 03/15/19 2254)  sterile water (preservative free) injection (0.9 mLs  Given 03/15/19 2300)     Initial Impression / Assessment and Plan / ED Course  I have reviewed the  triage vital signs and the nursing notes.  Pertinent labs & imaging results that were available during my care of the patient were reviewed by me and considered in my medical decision making (see chart for details).        BP (!) 145/92 (BP Location: Left Arm)   Pulse 83   Temp 98.4 F (36.9 C) (Oral)   Resp 18   LMP 03/15/2019   SpO2 100%    Final Clinical  Impressions(s) / ED Diagnoses   Final diagnoses:  Acute lower UTI  Cervicitis    ED Discharge Orders         Ordered    doxycycline (VIBRAMYCIN) 100 MG capsule  2 times daily     03/15/19 2310    fluconazole (DIFLUCAN) 150 MG tablet   Once     03/15/19 2310         10:09 PM Patient here for vaginal discharge and vaginal bleeding.  High risk sexual behaviors.  Will perform Pap examination.  Fortunately her pregnancy test is negative.  UA shows large amounts of hemoglobin and urine dipsticks likely a contaminant from her current menstruation.  11-20 WBCs were noted with rare bacteria and small amount of leukocyte esterase.  10:20 PM Patient with right adnexal tenderness with cervical motion tenderness on exam.  Vaginal bleeding was noted.  Patient is agreeable for antibiotic prophylaxis which includes Rocephin and Zithromax.  She also requests a prescription for Diflucan as she is prone to yeast due to antibiotic use.  Wet prep is unremarkable.  Will discharge home with doxycycline for UTI and potential cervicitis although suspicion is low.  Patient will be notified if test positive for any kind of STI.  Return precaution discussed.   Fayrene Helperran, Crickett Abbett, PA-C 03/15/19 2310    Milagros Lollykstra, Richard S, MD 03/18/19 [phone]

## 2019-03-16 LAB — URINE CULTURE: Culture: 40000 — AB

## 2019-03-16 LAB — RPR: RPR Ser Ql: NONREACTIVE

## 2019-03-16 LAB — HIV ANTIBODY (ROUTINE TESTING W REFLEX): HIV Screen 4th Generation wRfx: NONREACTIVE

## 2019-03-17 ENCOUNTER — Telehealth: Payer: Self-pay | Admitting: Emergency Medicine

## 2019-03-17 LAB — GC/CHLAMYDIA PROBE AMP (~~LOC~~) NOT AT ARMC
Chlamydia: NEGATIVE
Neisseria Gonorrhea: NEGATIVE

## 2019-03-17 NOTE — Progress Notes (Signed)
ED Antimicrobial Stewardship Positive Culture Follow Up   Brianna Booth is an 33 year old female who presented to [facility] on 8/21 with a chief complaint of vaginal bleeding/pain/disharge  40,000 colonies of Group B Strep  [x]  Treated with doxycycline, organism resistant to prescribed antimicrobial   New antibiotic prescription: Amoxicillin 500mg  three times daily for 5 days  ED Provider: Geradine Girt PharmD Clinical Pharmacist Monday - Friday phone -  [phone] Saturday - Sunday phone - [phone]

## 2019-03-17 NOTE — Telephone Encounter (Signed)
Post ED Visit - Positive Culture Follow-up: Unsuccessful Patient Follow-up  Culture assessed and recommendations reviewed by:  []  Elenor Quinones, Pharm.D. []  Heide Guile, Pharm.D., BCPS AQ-ID []  Parks Neptune, Pharm.D., BCPS []  Alycia Rossetti, Pharm.D., BCPS []  [place], Pharm.D., BCPS, AAHIVP []  Legrand Como, Pharm.D., BCPS, AAHIVP []  Wynell Balloon, PharmD [x]  Nicoletta Dress, PharmD, BCPS  Positive urine culture  []  Patient discharged without antimicrobial prescription and treatment is now indicated [x]  Organism is resistant to prescribed ED discharge antimicrobial []  Patient with positive blood cultures   Unable to contact patient at phone numbers on file, letter will be sent to address on file  Plan: Stop Doxycycline Start: Amoxicillin 500 mg three times daily for five days - [place] [employer] PA  [place] 03/17/2019, 10:48 AM

## 2019-03-19 ENCOUNTER — Telehealth: Payer: Self-pay | Admitting: Emergency Medicine

## 2019-03-19 NOTE — Telephone Encounter (Signed)
Post ED Visit - Positive Culture Follow-up: Successful Patient Follow-Up  Culture assessed and recommendations reviewed by:  []  Elenor Quinones, Pharm.D. []  Heide Guile, Pharm.D., BCPS AQ-ID []  Parks Neptune, Pharm.D., BCPS []  Alycia Rossetti, Pharm.D., BCPS []  [place], [place].D., BCPS, AAHIVP []  Legrand Como, Pharm.D., BCPS, AAHIVP []  Salome Arnt, PharmD, BCPS []  Johnnette Gourd, PharmD, BCPS []  Hughes Better, PharmD, BCPS []  Leeroy Cha, PharmD  Positive urine culture  []  Patient discharged without antimicrobial prescription and treatment is now indicated [x]  Organism is resistant to prescribed ED discharge antimicrobial []  Patient with positive blood cultures  Changes discussed with ED provider: Eustaquio Maize PA New antibiotic prescription: Amoxicillin 500 mg PO three times a day x five days Called to [facility] [phone] [facility])  Contacted patient, date 03/19/2019, time [place] 03/19/2019, 12:40 PM

## 2019-07-08 ENCOUNTER — Other Ambulatory Visit: Payer: Self-pay

## 2019-07-08 ENCOUNTER — Emergency Department (HOSPITAL_COMMUNITY)
Admission: EM | Admit: 2019-07-08 | Discharge: 2019-07-08 | Disposition: A | Discharge status: Home / Self Care | Payer: Medicare Other | Attending: Emergency Medicine | Admitting: Emergency Medicine

## 2019-07-08 ENCOUNTER — Encounter (HOSPITAL_COMMUNITY): Payer: Self-pay

## 2019-07-08 DIAGNOSIS — B9689 Other specified bacterial agents as the cause of diseases classified elsewhere: Secondary | ICD-10-CM | POA: Insufficient documentation

## 2019-07-08 DIAGNOSIS — J45909 Unspecified asthma, uncomplicated: Secondary | ICD-10-CM | POA: Diagnosis not present

## 2019-07-08 DIAGNOSIS — Z202 Contact with and (suspected) exposure to infections with a predominantly sexual mode of transmission: Secondary | ICD-10-CM | POA: Diagnosis not present

## 2019-07-08 DIAGNOSIS — Z87891 Personal history of nicotine dependence: Secondary | ICD-10-CM | POA: Insufficient documentation

## 2019-07-08 DIAGNOSIS — Z79899 Other long term (current) drug therapy: Secondary | ICD-10-CM | POA: Diagnosis not present

## 2019-07-08 DIAGNOSIS — N760 Acute vaginitis: Secondary | ICD-10-CM | POA: Diagnosis not present

## 2019-07-08 DIAGNOSIS — R0789 Other chest pain: Secondary | ICD-10-CM | POA: Diagnosis present

## 2019-07-08 LAB — WET PREP, GENITAL
Sperm: NONE SEEN
Trich, Wet Prep: NONE SEEN
Yeast Wet Prep HPF POC: NONE SEEN

## 2019-07-08 LAB — URINALYSIS, ROUTINE W REFLEX MICROSCOPIC
Bilirubin Urine: NEGATIVE
Glucose, UA: NEGATIVE mg/dL
Hgb urine dipstick: NEGATIVE
Ketones, ur: NEGATIVE mg/dL
Nitrite: NEGATIVE
Protein, ur: NEGATIVE mg/dL
Specific Gravity, Urine: 1.021 (ref 1.005–1.030)
pH: 5 (ref 5.0–8.0)

## 2019-07-08 LAB — CBC WITH DIFFERENTIAL/PLATELET
Abs Immature Granulocytes: 0.03 10*3/uL (ref 0.00–0.07)
Basophils Absolute: 0.1 10*3/uL (ref 0.0–0.1)
Basophils Relative: 1 %
Eosinophils Absolute: 0.4 10*3/uL (ref 0.0–0.5)
Eosinophils Relative: 4 %
HCT: 39.3 % (ref 36.0–46.0)
Hemoglobin: 13 g/dL (ref 12.0–15.0)
Immature Granulocytes: 0 %
Lymphocytes Relative: 33 %
Lymphs Abs: 2.9 10*3/uL (ref 0.7–4.0)
MCH: 28.9 pg (ref 26.0–34.0)
MCHC: 33.1 g/dL (ref 30.0–36.0)
MCV: 87.3 fL (ref 80.0–100.0)
Monocytes Absolute: 0.7 10*3/uL (ref 0.1–1.0)
Monocytes Relative: 8 %
Neutro Abs: 4.8 10*3/uL (ref 1.7–7.7)
Neutrophils Relative %: 54 %
Platelets: 250 10*3/uL (ref 150–400)
RBC: 4.5 MIL/uL (ref 3.87–5.11)
RDW: 14 % (ref 11.5–15.5)
WBC: 8.8 10*3/uL (ref 4.0–10.5)
nRBC: 0 % (ref 0.0–0.2)

## 2019-07-08 LAB — BASIC METABOLIC PANEL
Anion gap: 11 (ref 5–15)
BUN: 10 mg/dL (ref 6–20)
CO2: 25 mmol/L (ref 22–32)
Calcium: 9.2 mg/dL (ref 8.9–10.3)
Chloride: 104 mmol/L (ref 98–111)
Creatinine, Ser: 0.72 mg/dL (ref 0.44–1.00)
GFR calc Af Amer: >60 mL/min (ref >60)
GFR calc non Af Amer: >60 mL/min (ref >60)
Glucose, Bld: 91 mg/dL (ref 70–99)
Potassium: 3.6 mmol/L (ref 3.5–5.1)
Sodium: 140 mmol/L (ref 135–145)

## 2019-07-08 LAB — TROPONIN I (HIGH SENSITIVITY): Troponin I (High Sensitivity): <2 ng/L (ref <18)

## 2019-07-08 LAB — PREGNANCY, URINE: Preg Test, Ur: NEGATIVE

## 2019-07-08 MED ORDER — CEFTRIAXONE SODIUM 250 MG IJ SOLR
250.0000 mg | Freq: Once | INTRAMUSCULAR | Status: AC
  Administered 2019-07-08: 250 mg via INTRAMUSCULAR
  Filled 2019-07-08: qty 250

## 2019-07-08 MED ORDER — AZITHROMYCIN 250 MG PO TABS
1000.0000 mg | ORAL_TABLET | Freq: Once | ORAL | Status: AC
  Administered 2019-07-08: 13:00:00 1000 mg via ORAL
  Filled 2019-07-08: qty 4

## 2019-07-08 MED ORDER — FLUCONAZOLE 150 MG PO TABS: 150.0000 mg | ORAL_TABLET | Freq: Every day | ORAL | 0 refills | Status: DC

## 2019-07-08 MED ORDER — STERILE WATER FOR INJECTION IJ SOLN
INTRAMUSCULAR | Status: AC
  Filled 2019-07-08: qty 10

## 2019-07-08 MED ORDER — METRONIDAZOLE 500 MG PO TABS: 500.0000 mg | ORAL_TABLET | Freq: Two times a day (BID) | ORAL | 0 refills | Status: DC

## 2019-07-08 NOTE — ED Provider Notes (Signed)
[facility] EMERGENCY DEPARTMENT Provider Note   CSN: [id number] Arrival date & time: 07/08/19  4098     History Chief Complaint  Patient presents with  . Chest Pain  . Exposure to STD    Brianna Booth is a 33 y.o. female.  Patient is a 33 year old female with past medical history of asthma, anxiety, depression, and prior childbirth x8.  She presents today for evaluation of chest pain.  She describes a sharp pain in the center of her chest that started yesterday evening.  It is worse when she takes a deep breath and when she lays on her right side.  She denies any shortness of breath, nausea, diaphoresis, or radiation to the arm or jaw.  She denies any recent exertional symptoms.  Patient also requests STD testing.  She tells me she had unprotected sex with the father of her children with whom she is estranged.  She reports suprapubic discomfort and vaginal discharge.  The history is provided by the patient.  Chest Pain Exposure to STD Associated symptoms include chest pain.       Past Medical History:  Diagnosis Date  . Anxiety   . Asthma   . Depression   . Headache   . Pregnancy induced hypertension   . Preterm labor     Patient Active Problem List   Diagnosis Date Noted  . SVD (spontaneous vaginal delivery) 07/04/2018  . Gestational diabetes mellitus (GDM) in third trimester 07/01/2018  . Gestational hypertension 07/01/2018  . History of preterm delivery, currently pregnant 06/12/2018  . Anxiety and depression 06/12/2018  . Supervision of other normal pregnancy, antepartum 04/07/2018  . Hx of preeclampsia, prior pregnancy, currently pregnant, second trimester 02/23/2018  . Bell's palsy 03/05/2017    Past Surgical History:  Procedure Laterality Date  . DILATION AND CURETTAGE OF UTERUS       OB History    Gravida  13   Para  8   Term  3   Preterm  5   AB  5   Living  8     SAB  3   TAB  2   Ectopic      Multiple  0   Live Births  8           Family History  Problem Relation Age of Onset  . Hypertension Father     Social History   Tobacco Use  . Smoking status: Former Smoker    Quit date: 12/12/2017    Years since quitting: 1.5  . Smokeless tobacco: Never Used  Substance Use Topics  . Alcohol use: Never  . Drug use: Never    Home Medications Prior to Admission medications   Medication Sig Start Date End Date Taking? Authorizing Provider  acetaminophen (TYLENOL) 500 MG tablet Take 1,000 mg by mouth every 6 (six) hours as needed for moderate pain.    [provider]  albuterol (PROVENTIL HFA;VENTOLIN HFA) 108 (90 Base) MCG/ACT inhaler Inhale 1-2 puffs into the lungs every 6 (six) hours as needed for wheezing or shortness of breath.    [provider]  alprazolam Prudy Feeler) 2 MG tablet Take 2 mg by mouth 4 (four) times daily as needed for anxiety.    [provider]  doxycycline (VIBRAMYCIN) 100 MG capsule Take 1 capsule (100 mg total) by mouth 2 (two) times daily. One po bid x 7 days 03/15/19   Fayrene Helper, PA-C  ibuprofen (ADVIL,MOTRIN) 600 MG tablet Take 1 tablet (600  mg total) by mouth every 6 (six) hours. 07/06/18   Aletha Halim, MD  NIFEdipine (ADALAT CC) 30 MG 24 hr tablet Take 1 tablet (30 mg total) by mouth daily. 07/07/18   Aletha Halim, MD  Prenatal Multivit-Min-Fe-FA (PRENATAL VITAMINS) 0.8 MG tablet Take 1 tablet by mouth daily. 04/07/18   Laury Deep, CNM    Allergies    Patient has no known allergies.  Review of Systems   Review of Systems  Cardiovascular: Positive for chest pain.  All other systems reviewed and are negative.   Physical Exam Updated Vital Signs BP 140/89 (BP Location: Right Arm)   Pulse 82   Temp 98.5 F (36.9 C) (Oral)   Resp 17   SpO2 99%   Physical Exam Vitals and nursing note reviewed. Exam conducted with a chaperone present.  Constitutional:      General: She is not in acute distress.    Appearance: She is  well-developed. She is not diaphoretic.  HENT:     Head: Normocephalic and atraumatic.  Cardiovascular:     Rate and Rhythm: Normal rate and regular rhythm.     Heart sounds: No murmur. No friction rub. No gallop.   Pulmonary:     Effort: Pulmonary effort is normal. No respiratory distress.     Breath sounds: Normal breath sounds. No wheezing.  Abdominal:     General: Bowel sounds are normal. There is no distension.     Palpations: Abdomen is soft.     Tenderness: There is abdominal tenderness. There is no guarding or rebound.     Comments: There is mild suprapubic ttp.  Genitourinary:    General: Normal vulva.     Labia:        Right: No rash or tenderness.        Left: No rash or tenderness.      Comments: There is a slight whitish discharge present.  There is mild tenderness in the left adnexa, however no palpable masses.  The right adnexa is nontender and appears normal.  There is no cervical motion tenderness. Musculoskeletal:        General: Normal range of motion.     Cervical back: Normal range of motion and neck supple.  Skin:    General: Skin is warm and dry.  Neurological:     Mental Status: She is alert and oriented to person, place, and time.     ED Results / Procedures / Treatments   Labs (all labs ordered are listed, but only abnormal results are displayed) Labs Reviewed  WET PREP, GENITAL  BASIC METABOLIC PANEL  CBC WITH DIFFERENTIAL/PLATELET  URINALYSIS, ROUTINE W REFLEX MICROSCOPIC  PREGNANCY, URINE  GC/CHLAMYDIA PROBE AMP ([place]) NOT AT [facility]  TROPONIN I (HIGH SENSITIVITY)    EKG EKG Interpretation  Date/Time:  Saturday July 08 2019 10:00:32 EST Ventricular Rate:  87 PR Interval:    QRS Duration: 91 QT Interval:  366 QTC Calculation: 441 R Axis:   76 Text Interpretation: Sinus rhythm Possible  anteroseptal infarct, old Baseline wander in lead(s) III No prior ecg for comparison Confirmed by Veryl Speak [phone]) on 07/08/2019 10:26:20  AM   Radiology No results found.  Procedures Procedures (including critical care time)  Medications Ordered in ED Medications - No data to display  ED Course  I have reviewed the triage vital signs and the nursing notes.  Pertinent labs & imaging results that were available during my care of the patient were reviewed by me and  considered in my medical decision making (see chart for details).  Patient is a 33 year old female presenting with 2 complaints.    Her first complaint is chest pain that appears to be noncardiac.  She describes it as sharp in nature and worse when she lays on her right side.  Her work-up shows a normal EKG and negative troponin.  I do not feel as though any further work-up is necessary at this time.  I will advise her to take an anti-inflammatory and follow-up as needed.  Second complaint is that of vaginal discharge.  She reports having unprotected sex with her ex recently.  She does have some discharge on exam.  GC and Chlamydia cultures are pending.  Patient request presumptive treatment as she believes she is at risk for acquiring an STD from this individual.  She will be given Rocephin and Zithromax.  She will also be given Flagyl as there are clue cells on her wet prep and she is complaining of a discharge.  MDM Rules/Calculators/A&P   Final Clinical Impression(s) / ED Diagnoses Final diagnoses:  None    Rx / DC Orders ED Discharge Orders    None       Geoffery Lyonselo, Breunna Nordmann, MD 07/08/19 1238

## 2019-07-08 NOTE — Discharge Instructions (Addendum)
Begin taking Flagyl as prescribed.  We will call you if you require further treatment or need to take additional action.

## 2019-07-08 NOTE — ED Notes (Signed)
Patient verbalizes understanding of discharge instructions. Opportunity for questioning and answers were provided. Armband removed by staff, pt discharged from ED to home via POV  

## 2019-07-08 NOTE — ED Triage Notes (Signed)
Pt reports central non radiating chest pain that started last night, states the pain is worse when she lays on her side. Pt a.o, nad noted at this time, resp e.u. Pt also stating she wants to be checked for STDs

## 2019-07-11 LAB — GC/CHLAMYDIA PROBE AMP (~~LOC~~) NOT AT ARMC
Chlamydia: NEGATIVE
Neisseria Gonorrhea: NEGATIVE

## 2019-10-04 ENCOUNTER — Emergency Department (HOSPITAL_COMMUNITY)
Admission: EM | Admit: 2019-10-04 | Discharge: 2019-10-05 | Disposition: A | Discharge status: Home / Self Care | Payer: Self-pay | Attending: Emergency Medicine | Admitting: Emergency Medicine

## 2019-10-04 ENCOUNTER — Encounter (HOSPITAL_COMMUNITY): Payer: Self-pay

## 2019-10-04 DIAGNOSIS — N898 Other specified noninflammatory disorders of vagina: Secondary | ICD-10-CM

## 2019-10-04 DIAGNOSIS — Z79899 Other long term (current) drug therapy: Secondary | ICD-10-CM | POA: Insufficient documentation

## 2019-10-04 DIAGNOSIS — N760 Acute vaginitis: Secondary | ICD-10-CM | POA: Insufficient documentation

## 2019-10-04 DIAGNOSIS — B9689 Other specified bacterial agents as the cause of diseases classified elsewhere: Secondary | ICD-10-CM | POA: Insufficient documentation

## 2019-10-04 DIAGNOSIS — Z87891 Personal history of nicotine dependence: Secondary | ICD-10-CM | POA: Insufficient documentation

## 2019-10-04 DIAGNOSIS — R102 Pelvic and perineal pain: Secondary | ICD-10-CM | POA: Diagnosis not present

## 2019-10-04 DIAGNOSIS — F250 Schizoaffective disorder, bipolar type: Secondary | ICD-10-CM | POA: Diagnosis not present

## 2019-10-04 DIAGNOSIS — J45909 Unspecified asthma, uncomplicated: Secondary | ICD-10-CM | POA: Insufficient documentation

## 2019-10-04 DIAGNOSIS — Z202 Contact with and (suspected) exposure to infections with a predominantly sexual mode of transmission: Secondary | ICD-10-CM | POA: Insufficient documentation

## 2019-10-04 LAB — URINALYSIS, ROUTINE W REFLEX MICROSCOPIC
Bilirubin Urine: NEGATIVE
Glucose, UA: NEGATIVE mg/dL
Hgb urine dipstick: NEGATIVE
Ketones, ur: NEGATIVE mg/dL
Nitrite: NEGATIVE
Protein, ur: NEGATIVE mg/dL
Specific Gravity, Urine: 1.023 (ref 1.005–1.030)
pH: 6 (ref 5.0–8.0)

## 2019-10-04 LAB — I-STAT BETA HCG BLOOD, ED (MC, WL, AP ONLY): I-stat hCG, quantitative: <5 m[IU]/mL (ref <5)

## 2019-10-04 NOTE — ED Triage Notes (Signed)
Pt reports that she has a headache and that she has been having lower back pain and yellow vaginal discharge for the past 3 days.

## 2019-10-04 NOTE — ED Notes (Signed)
Pt waiting outside at this time.

## 2019-10-05 ENCOUNTER — Emergency Department (HOSPITAL_COMMUNITY): Payer: Self-pay

## 2019-10-05 LAB — CBC WITH DIFFERENTIAL/PLATELET
Abs Immature Granulocytes: 0.03 10*3/uL (ref 0.00–0.07)
Basophils Absolute: 0.1 10*3/uL (ref 0.0–0.1)
Basophils Relative: 1 %
Eosinophils Absolute: 0.4 10*3/uL (ref 0.0–0.5)
Eosinophils Relative: 3 %
HCT: 39.6 % (ref 36.0–46.0)
Hemoglobin: 12.6 g/dL (ref 12.0–15.0)
Immature Granulocytes: 0 %
Lymphocytes Relative: 40 %
Lymphs Abs: 4.2 10*3/uL — ABNORMAL HIGH (ref 0.7–4.0)
MCH: 28.8 pg (ref 26.0–34.0)
MCHC: 31.8 g/dL (ref 30.0–36.0)
MCV: 90.6 fL (ref 80.0–100.0)
Monocytes Absolute: 0.7 10*3/uL (ref 0.1–1.0)
Monocytes Relative: 7 %
Neutro Abs: 5.2 10*3/uL (ref 1.7–7.7)
Neutrophils Relative %: 49 %
Platelets: 277 10*3/uL (ref 150–400)
RBC: 4.37 MIL/uL (ref 3.87–5.11)
RDW: 14.4 % (ref 11.5–15.5)
WBC: 10.2 10*3/uL (ref 4.0–10.5)
nRBC: 0 % (ref 0.0–0.2)

## 2019-10-05 LAB — BASIC METABOLIC PANEL
Anion gap: 12 (ref 5–15)
BUN: 10 mg/dL (ref 6–20)
CO2: 21 mmol/L — ABNORMAL LOW (ref 22–32)
Calcium: 9.3 mg/dL (ref 8.9–10.3)
Chloride: 107 mmol/L (ref 98–111)
Creatinine, Ser: 0.81 mg/dL (ref 0.44–1.00)
GFR calc Af Amer: >60 mL/min (ref >60)
GFR calc non Af Amer: >60 mL/min (ref >60)
Glucose, Bld: 103 mg/dL — ABNORMAL HIGH (ref 70–99)
Potassium: 4.2 mmol/L (ref 3.5–5.1)
Sodium: 140 mmol/L (ref 135–145)

## 2019-10-05 LAB — WET PREP, GENITAL
Sperm: NONE SEEN
Trich, Wet Prep: NONE SEEN
Yeast Wet Prep HPF POC: NONE SEEN

## 2019-10-05 LAB — HIV ANTIBODY (ROUTINE TESTING W REFLEX): HIV Screen 4th Generation wRfx: NONREACTIVE

## 2019-10-05 MED ORDER — DOXYCYCLINE HYCLATE 100 MG PO CAPS: 100.0000 mg | ORAL_CAPSULE | Freq: Two times a day (BID) | ORAL | 0 refills | Status: DC

## 2019-10-05 MED ORDER — AZITHROMYCIN 250 MG PO TABS
1000.0000 mg | ORAL_TABLET | Freq: Once | ORAL | Status: AC
  Administered 2019-10-05: 1000 mg via ORAL
  Filled 2019-10-05: qty 4

## 2019-10-05 MED ORDER — STERILE WATER FOR INJECTION IJ SOLN
INTRAMUSCULAR | Status: AC
  Administered 2019-10-05: 10 mL
  Filled 2019-10-05: qty 10

## 2019-10-05 MED ORDER — METRONIDAZOLE 500 MG PO TABS: 500.0000 mg | ORAL_TABLET | Freq: Two times a day (BID) | ORAL | 0 refills | Status: DC

## 2019-10-05 MED ORDER — CEFTRIAXONE SODIUM 500 MG IJ SOLR
500.0000 mg | Freq: Once | INTRAMUSCULAR | Status: AC
  Administered 2019-10-05: 03:00:00 500 mg via INTRAMUSCULAR
  Filled 2019-10-05: qty 500

## 2019-10-05 NOTE — ED Notes (Signed)
Patient transported to Ultrasound 

## 2019-10-05 NOTE — ED Provider Notes (Signed)
[facility] EMERGENCY DEPARTMENT Provider Note   CSN: [id number] Arrival date & time: 10/04/19  1956     History Chief Complaint  Patient presents with  . Vaginal Discharge    Brianna Booth is a 34 y.o. female.  The history is provided by the patient and medical records.  Vaginal Discharge   34 year old female with history of anxiety, asthma, depression, headaches, presenting to the ED with pelvic pain, low back pain, vaginal discharge.  States she recently went home to [place] to visit her family and had a sexual encounter with father of her children who still lives there.  States shortly after returning home she began having the symptoms, have been ongoing for about 3 days.  States she feels a lot of pain and pressure in her pelvis and a lot of external irritation to her vaginal area.  She reports discharge has been green in color, has noticed some spotting as well.  She denies any nausea, vomiting, flank pain, or dysuria.  She reports history of gonorrhea in the past from the same sexual partner.  She does not think he is currently having any symptoms.  Of note, on arrival patient with low-grade temperature of 100.107F.  She was unaware of this and denies any known fevers at home.  Past Medical History:  Diagnosis Date  . Anxiety   . Asthma   . Depression   . Headache   . Pregnancy induced hypertension   . Preterm labor     Patient Active Problem List   Diagnosis Date Noted  . SVD (spontaneous vaginal delivery) 07/04/2018  . Gestational diabetes mellitus (GDM) in third trimester 07/01/2018  . Gestational hypertension 07/01/2018  . History of preterm delivery, currently pregnant 06/12/2018  . Anxiety and depression 06/12/2018  . Supervision of other normal pregnancy, antepartum 04/07/2018  . Hx of preeclampsia, prior pregnancy, currently pregnant, second trimester 02/23/2018  . Bell's palsy 03/05/2017    Past Surgical History:  Procedure  Laterality Date  . DILATION AND CURETTAGE OF UTERUS       OB History    Gravida  13   Para  8   Term  3   Preterm  5   AB  5   Living  8     SAB  3   TAB  2   Ectopic      Multiple  0   Live Births  8           Family History  Problem Relation Age of Onset  . Hypertension Father     Social History   Tobacco Use  . Smoking status: Former Smoker    Quit date: 12/12/2017    Years since quitting: 1.8  . Smokeless tobacco: Never Used  Substance Use Topics  . Alcohol use: Never  . Drug use: Never    Home Medications Prior to Admission medications   Medication Sig Start Date End Date Taking? Authorizing Provider  acetaminophen (TYLENOL) 500 MG tablet Take 1,000 mg by mouth every 6 (six) hours as needed for moderate pain.    [provider]  albuterol (PROVENTIL HFA;VENTOLIN HFA) 108 (90 Base) MCG/ACT inhaler Inhale 1-2 puffs into the lungs every 6 (six) hours as needed for wheezing or shortness of breath.    [provider]  alprazolam Prudy Feeler) 2 MG tablet Take 2 mg by mouth 4 (four) times daily as needed for anxiety.    [provider]  doxycycline (VIBRAMYCIN) 100 MG capsule  Take 1 capsule (100 mg total) by mouth 2 (two) times daily. One po bid x 7 days 03/15/19   Fayrene Helper, PA-C  fluconazole (DIFLUCAN) 150 MG tablet Take 1 tablet (150 mg total) by mouth daily. 07/08/19   Geoffery Lyons, MD  ibuprofen (ADVIL,MOTRIN) 600 MG tablet Take 1 tablet (600 mg total) by mouth every 6 (six) hours. 07/06/18   [place] Bing, MD  metroNIDAZOLE (FLAGYL) 500 MG tablet Take 1 tablet (500 mg total) by mouth 2 (two) times daily. One po bid x 7 days 07/08/19   Geoffery Lyons, MD  NIFEdipine (ADALAT CC) 30 MG 24 hr tablet Take 1 tablet (30 mg total) by mouth daily. 07/07/18   [place] Bing, MD  Prenatal Multivit-Min-Fe-FA (PRENATAL VITAMINS) 0.8 MG tablet Take 1 tablet by mouth daily. 04/07/18   Raelyn Mora, CNM    Allergies    Patient has no  known allergies.  Review of Systems   Review of Systems  Genitourinary: Positive for pelvic pain and vaginal discharge.  All other systems reviewed and are negative.   Physical Exam Updated Vital Signs BP (!) 153/84 (BP Location: Right Arm)   Pulse 75   Temp 100.2 F (37.9 C) (Oral)   Resp 20   Ht 5\' 7"  (1.702 m)   Wt 99.8 kg   SpO2 96%   BMI 34.46 kg/m   Physical Exam Vitals and nursing note reviewed. Exam conducted with a chaperone present.  Constitutional:      Appearance: She is well-developed. She is not diaphoretic.  HENT:     Head: Normocephalic and atraumatic.  Eyes:     Conjunctiva/sclera: Conjunctivae normal.     Pupils: Pupils are equal, round, and reactive to light.  Cardiovascular:     Rate and Rhythm: Normal rate and regular rhythm.     Heart sounds: Normal heart sounds.  Pulmonary:     Effort: Pulmonary effort is normal.     Breath sounds: Normal breath sounds. No stridor. No wheezing.  Abdominal:     General: Bowel sounds are normal.     Palpations: Abdomen is soft.     Tenderness: There is no abdominal tenderness. There is no rebound.  Genitourinary:    Comments: Exam chaperoned by RN Normal female external genitalia, no noted lesions or rash, does have some irritation along the vaginal introitus, large amount of thin yellow/green discharge, along with some thicker white vaginal discharge; no CMT, does have right adnexal tenderness without mass, left adnexa non-tender Musculoskeletal:        General: Normal range of motion.     Cervical back: Normal range of motion.  Skin:    General: Skin is warm and dry.  Neurological:     Mental Status: She is alert and oriented to person, place, and time.     ED Results / Procedures / Treatments   Labs (all labs ordered are listed, but only abnormal results are displayed) Labs Reviewed  WET PREP, GENITAL - Abnormal; Notable for the following components:      Result Value   Clue Cells Wet Prep HPF POC  PRESENT (*)    WBC, Wet Prep HPF POC MANY (*)    All other components within normal limits  URINALYSIS, ROUTINE W REFLEX MICROSCOPIC - Abnormal; Notable for the following components:   APPearance CLOUDY (*)    Leukocytes,Ua LARGE (*)    Bacteria, UA RARE (*)    All other components within normal limits  CBC WITH DIFFERENTIAL/PLATELET - Abnormal; Notable  for the following components:   Lymphs Abs 4.2 (*)    All other components within normal limits  BASIC METABOLIC PANEL - Abnormal; Notable for the following components:   CO2 21 (*)    Glucose, Bld 103 (*)    All other components within normal limits  HIV ANTIBODY (ROUTINE TESTING W REFLEX)  RPR  I-STAT BETA HCG BLOOD, ED ([facility], [facility], [facility] ONLY)  GC/CHLAMYDIA PROBE AMP ([place]) NOT AT [facility]    EKG None  Radiology US PELVIC COMPLETE W TRANSVAGINAL AND TORSION R/O  Result Date: 10/05/2019 CLINICAL DATA:  Pelvic pain for 3 days EXAM: TRANSABDOMINAL AND TRANSVAGINAL ULTRASOUND OF PELVIS DOPPLER ULTRASOUND OF OVARIES TECHNIQUE: Both transabdominal and transvaginal ultrasound examinations of the pelvis were performed. Transabdominal technique was performed for global imaging of the pelvis including uterus, ovaries, adnexal regions, and pelvic cul-de-sac. It was necessary to proceed with endovaginal exam following the transabdominal exam to visualize the cervix, uterus, endometrium and adnexa. Color and duplex Doppler ultrasound was utilized to evaluate blood flow to the ovaries. COMPARISON:  Obstetrical ultrasound 07/02/2018 FINDINGS: Uterus Measurements: 9.6 x 4.6 x 4.6 cm = volume: 103.9 mL. Anechoic 5 mm cystic focus at the level of the cervix with increased posterior through transmission most compatible with a cervical nabothian cyst. Endometrium Thickness: 6 mm, non thickened.  No focal abnormality visualized. Right ovary Measurements: 3.6 x 2.4 x 2.2 cm = volume: 10.2 mL. Normal appearance/no adnexal mass. Left ovary Measurements: 2.9 x 1.4 x  2.4 cm = volume: 4.8 mL. Normal appearance/no adnexal mass. Pulsed Doppler evaluation of both ovaries demonstrates normal low-resistance arterial and venous waveforms. Other findings No abnormal free fluid. IMPRESSION: Small nabothian cysts at the level of the cervix a typically benign finding. No acute pelvic abnormality is seen. Specifically, no evidence of ovarian torsion. Electronically Signed   By: Kreg Shropshire M.D.   On: 10/05/2019 02:03    Procedures Procedures (including critical care time)  Medications Ordered in ED Medications - No data to display  ED Course  I have reviewed the triage vital signs and the nursing notes.  Pertinent labs & imaging results that were available during my care of the patient were reviewed by me and considered in my medical decision making (see chart for details).    MDM Rules/Calculators/A&P  34 year old female presenting to the ED with pelvic pain and vaginal discharge.  She did have a low-grade fever on arrival here, denies any fevers at home and was unaware of this.  She is not tachycardic and remains hemodynamically stable.  Her abdomen is overall soft and benign.  Pelvic exam was performed, moderate amount of yellow/green vaginal discharge along with a thicker, white vaginal discharge on exam.  She does have some right adnexal tenderness but no cervical motion or left adnexal tenderness.  No masses appreciated.  Labs are overall reassuring.  Wet prep with clue cells noted. Gc/chl, HIV, RPR pending.  US obtained, no acute findings such as TOA or torsion.  She does have small nabothian cysts at the level of the cervix, felt to be benign.  Given patient's symptoms and low-grade fever here, will treat empirically here with Rocephin and azithromycin.  Will treat for PID as an outpatient with Flagyl and doxycycline.  She was encouraged to follow-up with OB/GYN.  She will return here for any new or acute changes.  Final Clinical Impression(s) / ED  Diagnoses Final diagnoses:  Pelvic pain  Possible exposure to STD  Vaginal discharge  Bacterial vaginosis    Rx / DC Orders ED Discharge Orders         Ordered    metroNIDAZOLE (FLAGYL) 500 MG tablet  2 times daily     10/05/19 0212    doxycycline (VIBRAMYCIN) 100 MG capsule  2 times daily     10/05/19 0212           Larene Pickett, PA-C 10/05/19 0310    Fatima Blank, MD 10/06/19 [phone]

## 2019-10-05 NOTE — Discharge Instructions (Addendum)
Take the prescribed medication as directed.  Do not drink alcohol while taking flagyl, it will make you sick. You will be notified if your remaining STD cultures are positive and if further treatment needed. [facility] with [facility] if any ongoing issues-- can call for appt. Return to the ED for new or worsening symptoms.

## 2019-10-06 LAB — RPR
RPR Ser Ql: UNDETERMINED
RPR Titer: UNDETERMINED

## 2019-10-06 LAB — GC/CHLAMYDIA PROBE AMP (~~LOC~~) NOT AT ARMC
Chlamydia: NEGATIVE
Neisseria Gonorrhea: NEGATIVE

## 2019-11-16 DIAGNOSIS — F250 Schizoaffective disorder, bipolar type: Secondary | ICD-10-CM | POA: Diagnosis not present

## 2020-01-04 DIAGNOSIS — F250 Schizoaffective disorder, bipolar type: Secondary | ICD-10-CM | POA: Diagnosis not present

## 2020-03-16 ENCOUNTER — Emergency Department (HOSPITAL_COMMUNITY): Payer: Medicare HMO

## 2020-03-16 ENCOUNTER — Other Ambulatory Visit: Payer: Self-pay

## 2020-03-16 ENCOUNTER — Emergency Department (HOSPITAL_COMMUNITY)
Admission: EM | Admit: 2020-03-16 | Discharge: 2020-03-16 | Disposition: A | Discharge status: Home / Self Care | Payer: Medicare HMO | Attending: Emergency Medicine | Admitting: Emergency Medicine

## 2020-03-16 ENCOUNTER — Encounter (HOSPITAL_COMMUNITY): Payer: Self-pay

## 2020-03-16 DIAGNOSIS — Z87891 Personal history of nicotine dependence: Secondary | ICD-10-CM | POA: Insufficient documentation

## 2020-03-16 DIAGNOSIS — R102 Pelvic and perineal pain: Secondary | ICD-10-CM

## 2020-03-16 DIAGNOSIS — Z79899 Other long term (current) drug therapy: Secondary | ICD-10-CM | POA: Diagnosis not present

## 2020-03-16 DIAGNOSIS — R109 Unspecified abdominal pain: Secondary | ICD-10-CM | POA: Diagnosis present

## 2020-03-16 DIAGNOSIS — J45909 Unspecified asthma, uncomplicated: Secondary | ICD-10-CM | POA: Diagnosis not present

## 2020-03-16 DIAGNOSIS — R10819 Abdominal tenderness, unspecified site: Secondary | ICD-10-CM | POA: Diagnosis not present

## 2020-03-16 DIAGNOSIS — N760 Acute vaginitis: Secondary | ICD-10-CM

## 2020-03-16 DIAGNOSIS — B9689 Other specified bacterial agents as the cause of diseases classified elsewhere: Secondary | ICD-10-CM | POA: Diagnosis not present

## 2020-03-16 LAB — URINALYSIS, ROUTINE W REFLEX MICROSCOPIC
Bilirubin Urine: NEGATIVE
Glucose, UA: NEGATIVE mg/dL
Hgb urine dipstick: NEGATIVE
Ketones, ur: NEGATIVE mg/dL
Nitrite: NEGATIVE
Protein, ur: NEGATIVE mg/dL
Specific Gravity, Urine: 1.024 (ref 1.005–1.030)
pH: 5 (ref 5.0–8.0)

## 2020-03-16 LAB — COMPREHENSIVE METABOLIC PANEL
ALT: 23 U/L (ref 0–44)
AST: 19 U/L (ref 15–41)
Albumin: 4.2 g/dL (ref 3.5–5.0)
Alkaline Phosphatase: 45 U/L (ref 38–126)
Anion gap: 9 (ref 5–15)
BUN: 12 mg/dL (ref 6–20)
CO2: 25 mmol/L (ref 22–32)
Calcium: 9.9 mg/dL (ref 8.9–10.3)
Chloride: 102 mmol/L (ref 98–111)
Creatinine, Ser: 0.77 mg/dL (ref 0.44–1.00)
GFR calc Af Amer: >60 mL/min (ref >60)
GFR calc non Af Amer: >60 mL/min (ref >60)
Glucose, Bld: 89 mg/dL (ref 70–99)
Potassium: 4 mmol/L (ref 3.5–5.1)
Sodium: 136 mmol/L (ref 135–145)
Total Bilirubin: 0.5 mg/dL (ref 0.3–1.2)
Total Protein: 7.9 g/dL (ref 6.5–8.1)

## 2020-03-16 LAB — HIV ANTIBODY (ROUTINE TESTING W REFLEX): HIV Screen 4th Generation wRfx: NONREACTIVE

## 2020-03-16 LAB — CBC
HCT: 41.7 % (ref 36.0–46.0)
Hemoglobin: 13.3 g/dL (ref 12.0–15.0)
MCH: 28.2 pg (ref 26.0–34.0)
MCHC: 31.9 g/dL (ref 30.0–36.0)
MCV: 88.3 fL (ref 80.0–100.0)
Platelets: 285 10*3/uL (ref 150–400)
RBC: 4.72 MIL/uL (ref 3.87–5.11)
RDW: 14.1 % (ref 11.5–15.5)
WBC: 8.9 10*3/uL (ref 4.0–10.5)
nRBC: 0 % (ref 0.0–0.2)

## 2020-03-16 LAB — LIPASE, BLOOD: Lipase: 37 U/L (ref 11–51)

## 2020-03-16 LAB — I-STAT BETA HCG BLOOD, ED (MC, WL, AP ONLY): I-stat hCG, quantitative: <5 m[IU]/mL (ref <5)

## 2020-03-16 LAB — WET PREP, GENITAL
Sperm: NONE SEEN
Trich, Wet Prep: NONE SEEN
Yeast Wet Prep HPF POC: NONE SEEN

## 2020-03-16 MED ORDER — METRONIDAZOLE 500 MG PO TABS: 500.0000 mg | ORAL_TABLET | Freq: Two times a day (BID) | ORAL | 0 refills | Status: DC

## 2020-03-16 NOTE — Discharge Instructions (Addendum)
Please take all of your antibiotics until finished!   You may develop abdominal discomfort or diarrhea from the antibiotic.  You may help offset this with probiotics which you can buy or get in yogurt. Do not eat or take the probiotics until 2 hours after your antibiotic.   Follow-up with OB/GYN for any further management.  You have been tested for STDs. Some of these results are still pending. Any abnormalities will be called to you. Be sure to follow safe sex practices, including monogamy and/or condom use. All sexual partners must also be notified and treated.  Any future STD testing or treatment should be performed at the [facility] department or primary care office. For the treatment to be fully effective, avoid all sexual contact for at least 2 weeks after medication administration. Otherwise, you will infect your partner(s) and/or risk reinfecting yourself. Should symptoms fail to improve within 1 week, follow up with a primary care provider or go to the [facility].

## 2020-03-16 NOTE — ED Provider Notes (Signed)
[facility]GENCY [facility] Provider Note   CSN: [id number] Arrival date & time: 03/16/20  1514     History Chief Complaint  Patient presents with  . Abdominal Pain    Brianna Booth is a 34 y.o. female.  HPI      Brianna Booth is a 34 y.o. female, with a history of obesity, asthma, anxiety, presenting to the ED with abdominal pain for the past week. Pain is mostly in the left lower quadrant, intermittent, sometimes radiating to the suprapubic region and sometimes around to the left lower back, described as a discomfort, moderate. She also states she had unprotected consensual sex with a female partner about a week ago. Increased abnormal vaginal discharge that she describes as cloudy. Denies fever/chills, chest pain, dysuria, hematuria, abnormal vaginal bleeding, N/V/D, syncope, dizziness, or any other complaints.    Past Medical History:  Diagnosis Date  . Anxiety   . Asthma   . Depression   . Headache   . Pregnancy induced hypertension   . Preterm labor     Patient Active Problem List   Diagnosis Date Noted  . SVD (spontaneous vaginal delivery) 07/04/2018  . Gestational diabetes mellitus (GDM) in third trimester 07/01/2018  . Gestational hypertension 07/01/2018  . History of preterm delivery, currently pregnant 06/12/2018  . Anxiety and depression 06/12/2018  . Supervision of other normal pregnancy, antepartum 04/07/2018  . Hx of preeclampsia, prior pregnancy, currently pregnant, second trimester 02/23/2018  . Bell's palsy 03/05/2017    Past Surgical History:  Procedure Laterality Date  . DILATION AND CURETTAGE OF UTERUS       OB History    Gravida  13   Para  8   Term  3   Preterm  5   AB  5   Living  8     SAB  3   TAB  2   Ectopic      Multiple  0   Live Births  8           Family History  Problem Relation Age of Onset  . Hypertension Father     Social History   Tobacco Use  . Smoking status:  Former Smoker    Quit date: 12/12/2017    Years since quitting: 2.2  . Smokeless tobacco: Never Used  Vaping Use  . Vaping Use: Never used  Substance Use Topics  . Alcohol use: Never  . Drug use: Never    Home Medications Prior to Admission medications   Medication Sig Start Date End Date Taking? Authorizing Provider  acetaminophen (TYLENOL) 500 MG tablet Take 1,000 mg by mouth every 6 (six) hours as needed for moderate pain.    [provider]  albuterol (PROVENTIL HFA;VENTOLIN HFA) 108 (90 Base) MCG/ACT inhaler Inhale 1-2 puffs into the lungs every 6 (six) hours as needed for wheezing or shortness of breath.    [provider]  alprazolam Prudy Feeler(XANAX) 2 MG tablet Take 2 mg by mouth 4 (four) times daily as needed for anxiety.    [provider]  ARIPiprazole (ABILIFY) 20 MG tablet Take 20 mg by mouth at bedtime. 10/04/19   [provider]  doxycycline (VIBRAMYCIN) 100 MG capsule Take 1 capsule (100 mg total) by mouth 2 (two) times daily. 10/05/19   Garlon HatchetSanders, Lisa M, PA-C  fluconazole (DIFLUCAN) 150 MG tablet Take 1 tablet (150 mg total) by mouth daily. Patient not taking: Reported on 10/05/2019 07/08/19   Geoffery Lyonselo, [place], MD  hydrOXYzine (  VISTARIL) 50 MG capsule Take 50 mg by mouth 4 (four) times daily as needed for anxiety.  09/20/19   [provider]  ibuprofen (ADVIL,MOTRIN) 600 MG tablet Take 1 tablet (600 mg total) by mouth every 6 (six) hours. Patient not taking: Reported on 10/05/2019 07/06/18   [place] Bing, MD  lamoTRIgine (LAMICTAL) 150 MG tablet Take 150 mg by mouth at bedtime. 10/04/19   [provider]  metroNIDAZOLE (FLAGYL) 500 MG tablet Take 1 tablet (500 mg total) by mouth 2 (two) times daily. 03/16/20   Kathlen Sakurai C, PA-C  NIFEdipine (ADALAT CC) 30 MG 24 hr tablet Take 1 tablet (30 mg total) by mouth daily. Patient not taking: Reported on 10/05/2019 07/07/18   [place] Bing, MD  Prenatal Multivit-Min-Fe-FA (PRENATAL VITAMINS)  0.8 MG tablet Take 1 tablet by mouth daily. Patient not taking: Reported on 10/05/2019 04/07/18   Raelyn Mora, CNM    Allergies    Patient has no known allergies.  Review of Systems   Review of Systems  Constitutional: Negative for chills, diaphoresis and fever.  Respiratory: Negative for cough and shortness of breath.   Cardiovascular: Negative for chest pain and leg swelling.  Gastrointestinal: Positive for abdominal pain. Negative for blood in stool, diarrhea, nausea and vomiting.  Genitourinary: Positive for vaginal discharge. Negative for dysuria, flank pain, hematuria and vaginal bleeding.  Musculoskeletal: Positive for back pain.  Neurological: Negative for dizziness, syncope and weakness.    Physical Exam Updated Vital Signs BP 130/77 (BP Location: Left Arm)   Pulse 75   Temp 98.6 F (37 C) (Oral)   Resp 16   Wt 113.4 kg   SpO2 100%   BMI 39.16 kg/m   Physical Exam Vitals and nursing note reviewed.  Constitutional:      General: She is not in acute distress.    Appearance: She is well-developed. She is obese. She is not diaphoretic.  HENT:     Head: Normocephalic and atraumatic.     Mouth/Throat:     Mouth: Mucous membranes are moist.     Pharynx: Oropharynx is clear.  Eyes:     Conjunctiva/sclera: Conjunctivae normal.  Cardiovascular:     Rate and Rhythm: Normal rate and regular rhythm.     Pulses: Normal pulses.          Radial pulses are 2+ on the right side and 2+ on the left side.     Heart sounds: Normal heart sounds.  Pulmonary:     Effort: Pulmonary effort is normal. No respiratory distress.     Breath sounds: Normal breath sounds.  Abdominal:     Palpations: Abdomen is soft.     Tenderness: There is abdominal tenderness. There is no right CVA tenderness, left CVA tenderness or guarding.       Comments: Seemingly mild tenderness in the region indicated.  Genitourinary:    Comments: External genitalia normal Vagina with small amount of thick,  white discharge Cervix  normal negative for cervical motion tenderness Adnexa palpated, no masses, positive for tenderness noted on the left. Bladder palpated negative for tenderness Uterus palpated no masses, positive for tenderness  No inguinal lymphadenopathy. Otherwise normal female genitalia. RN, Sula Rumple, served as chaperone during exam. Musculoskeletal:     Cervical back: Neck supple.  Lymphadenopathy:     Cervical: No cervical adenopathy.  Skin:    General: Skin is warm and dry.  Neurological:     Mental Status: She is alert.  Psychiatric:  Mood and Affect: Mood and affect normal.        Speech: Speech normal.        Behavior: Behavior normal.     ED Results / Procedures / Treatments   Labs (all labs ordered are listed, but only abnormal results are displayed) Labs Reviewed  WET PREP, GENITAL - Abnormal; Notable for the following components:      Result Value   Clue Cells Wet Prep HPF POC PRESENT (*)    WBC, Wet Prep HPF POC FEW (*)    All other components within normal limits  URINALYSIS, ROUTINE W REFLEX MICROSCOPIC - Abnormal; Notable for the following components:   APPearance HAZY (*)    Leukocytes,Ua TRACE (*)    Bacteria, UA RARE (*)    All other components within normal limits  URINE CULTURE  LIPASE, BLOOD  COMPREHENSIVE METABOLIC PANEL  CBC  HIV ANTIBODY (ROUTINE TESTING W REFLEX)  RPR  I-STAT BETA HCG BLOOD, ED ([facility], [facility], [facility] ONLY)  GC/CHLAMYDIA PROBE AMP ([place]) NOT AT [facility]    EKG None  Radiology US PELVIC COMPLETE W TRANSVAGINAL AND TORSION R/O  Result Date: 03/16/2020 CLINICAL DATA:  Left adnexal pain EXAM: TRANSABDOMINAL AND TRANSVAGINAL ULTRASOUND OF PELVIS DOPPLER ULTRASOUND OF OVARIES TECHNIQUE: Both transabdominal and transvaginal ultrasound examinations of the pelvis were performed. Transabdominal technique was performed for global imaging of the pelvis including uterus, ovaries, adnexal regions, and pelvic cul-de-sac. It was  necessary to proceed with endovaginal exam following the transabdominal exam to visualize the uterus endometrium ovaries. Color and duplex Doppler ultrasound was utilized to evaluate blood flow to the ovaries. COMPARISON:  Ultrasound 10/05/2019 FINDINGS: Uterus Measurements: 9 x 4.2 x 5.6 cm = volume: 110.6 mL. No fibroids or other mass visualized. Endometrium Thickness: 5 mm.  No focal abnormality visualized. Right ovary Measurements: 3.9 x 2.9 x 3 cm = volume: 18 mL. Normal appearance/no adnexal mass. Left ovary Measurements: 3.3 x 1.8 x 2.7 cm = volume: 8.1 mL. Normal appearance/no adnexal mass. Pulsed Doppler evaluation of both ovaries demonstrates normal low-resistance arterial and venous waveforms. Other findings No abnormal free fluid. IMPRESSION: Negative pelvic ultrasound.  No evidence for ovarian torsion Electronically Signed   By: Jasmine Pang M.D.   On: 03/16/2020 21:54    Procedures Pelvic exam  Date/Time: 03/16/2020 8:24 PM Performed by: Anselm Pancoast, PA-C Authorized by: Anselm Pancoast, PA-C  Local anesthesia used: no  Anesthesia: Local anesthesia used: no  Sedation: Patient sedated: no  Patient tolerance: patient tolerated the procedure well with no immediate complications    (including critical care time)  Medications Ordered in ED Medications - No data to display  ED Course  I have reviewed the triage vital signs and the nursing notes.  Pertinent labs & imaging results that were available during my care of the patient were reviewed by me and considered in my medical decision making (see chart for details).  Clinical Course as of Mar 16 2310  Sat Mar 16, 2020  2100 This appears to have been when the patient was lying on her side.  BP: 103/66 [SJ]    Clinical Course User Index [SJ] Natasia Sanko, Hillard Danker, PA-C   MDM Rules/Calculators/A&P                          Patient presents originally complaining of abdominal pain. Patient is nontoxic appearing, afebrile, not  tachycardic, not tachypneic, not hypotensive, maintains excellent SPO2 on room air,  and is in no apparent distress.  Later in the interview, however, she states she is less concerned about pain or discomfort and more concerned about needing STD testing due to her unprotected sexual encounter.  I reviewed and interpreted the patient's labs and radiological studies. No leukocytosis.   Clue cells on wet prep.  I discussed this finding with the patient.  She states she has had BV multiple times in the past.  I discussed the option for simply observing her symptoms rather than initiating antibiotic treatment each time.  She opted for oral antibiotics this time.  The patient was given instructions for home care as well as return precautions. Patient voices understanding of these instructions, accepts the plan, and is comfortable with discharge.    Final Clinical Impression(s) / ED Diagnoses Final diagnoses:  BV (bacterial vaginosis)    Rx / DC Orders ED Discharge Orders         Ordered    metroNIDAZOLE (FLAGYL) 500 MG tablet  2 times daily        03/16/20 2255           Concepcion Living 03/16/20 2311    Terrilee Files, MD 03/16/20 [phone]

## 2020-03-16 NOTE — ED Triage Notes (Signed)
Pt arrives POV for eval of lower abd pain onset last night. States +vag d/c, denies bleeding. States LMP 8/21

## 2020-03-17 LAB — RPR: RPR Ser Ql: NONREACTIVE

## 2020-03-18 LAB — URINE CULTURE

## 2020-03-18 LAB — GC/CHLAMYDIA PROBE AMP (~~LOC~~) NOT AT ARMC
Chlamydia: NEGATIVE
Comment: NEGATIVE
Comment: NORMAL
Neisseria Gonorrhea: NEGATIVE

## 2020-04-11 DIAGNOSIS — F250 Schizoaffective disorder, bipolar type: Secondary | ICD-10-CM | POA: Diagnosis not present

## 2020-05-06 DIAGNOSIS — F250 Schizoaffective disorder, bipolar type: Secondary | ICD-10-CM | POA: Diagnosis not present

## 2020-05-23 DIAGNOSIS — J069 Acute upper respiratory infection, unspecified: Secondary | ICD-10-CM | POA: Diagnosis not present

## 2020-05-23 DIAGNOSIS — G4700 Insomnia, unspecified: Secondary | ICD-10-CM | POA: Diagnosis not present

## 2020-05-23 DIAGNOSIS — F411 Generalized anxiety disorder: Secondary | ICD-10-CM | POA: Diagnosis not present

## 2020-05-23 DIAGNOSIS — J309 Allergic rhinitis, unspecified: Secondary | ICD-10-CM | POA: Diagnosis not present

## 2020-05-23 DIAGNOSIS — J45909 Unspecified asthma, uncomplicated: Secondary | ICD-10-CM | POA: Diagnosis not present

## 2020-05-23 DIAGNOSIS — Z7189 Other specified counseling: Secondary | ICD-10-CM | POA: Diagnosis not present

## 2020-05-23 DIAGNOSIS — F331 Major depressive disorder, recurrent, moderate: Secondary | ICD-10-CM | POA: Diagnosis not present

## 2020-05-23 DIAGNOSIS — F319 Bipolar disorder, unspecified: Secondary | ICD-10-CM | POA: Diagnosis not present

## 2020-05-24 DIAGNOSIS — U071 COVID-19: Secondary | ICD-10-CM | POA: Diagnosis not present

## 2020-05-25 ENCOUNTER — Telehealth (HOSPITAL_COMMUNITY): Payer: Self-pay | Admitting: Family

## 2020-05-25 DIAGNOSIS — R69 Illness, unspecified: Secondary | ICD-10-CM

## 2020-05-25 NOTE — Telephone Encounter (Signed)
Called to discuss with Brianna Booth about Covid symptoms and potential candidacy for the use of sotrovimab, a combination monoclonal antibody infusion for those with mild to moderate Covid symptoms and at a high risk of hospitalization.     Pt is qualified for this infusion at the infusion center due to co-morbid conditions and/or a member of an at-risk group. She states she would like for me to email her information, which was sent to her my chart portal. She states she would like to come today if she can find child care. She requested I call back in 10 min, so she could call a family member. Attempted x2 to call her back as discussed, but phone went to VM. VM left with hotline number.    Jonthan Leite,NP

## 2020-05-28 IMAGING — US US PELVIS COMPLETE TRANSABD/TRANSVAG W DUPLEX
1 series · 13 of 25 positions shown · non-contrast
Comparison: Obstetrical ultrasound 07/02/2018

CLINICAL DATA: Pelvic pain for 3 days

EXAM:
TRANSABDOMINAL AND TRANSVAGINAL ULTRASOUND OF PELVIS
DOPPLER ULTRASOUND OF OVARIES
TECHNIQUE: Both transabdominal and transvaginal ultrasound examinations of the
pelvis were performed. Transabdominal technique was performed for
global imaging of the pelvis including uterus, ovaries, adnexal
regions, and pelvic cul-de-sac.
It was necessary to proceed with endovaginal exam following the
transabdominal exam to visualize the cervix, uterus, endometrium and
adnexa. Color and duplex Doppler ultrasound was utilized to evaluate
blood flow to the ovaries.

[Series 1: us pelvic complete w transvaginal and torsion righ · 13 of 90 slices shown]
[im 1/90]
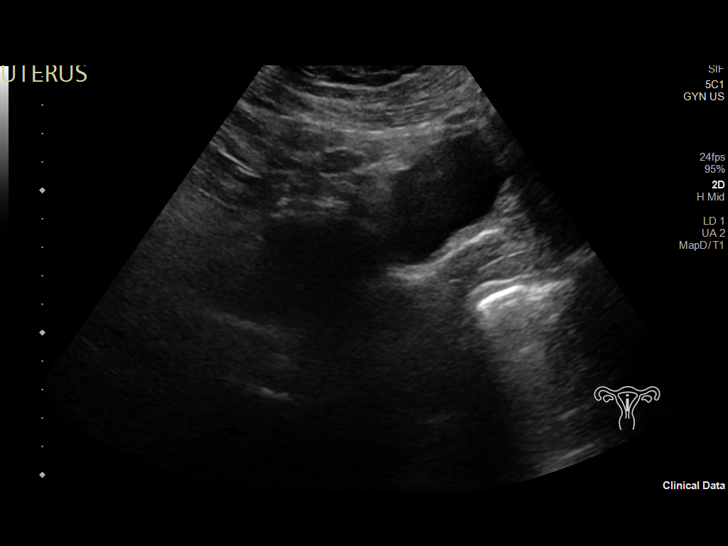
[im 8/90]
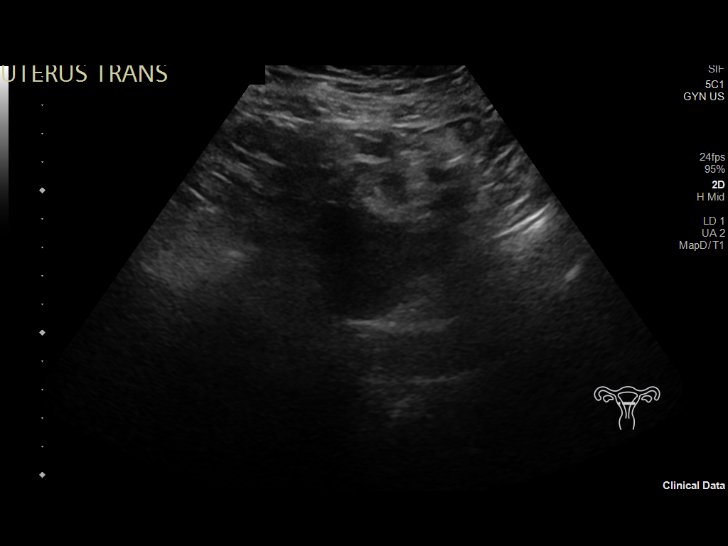
[im 15/90]
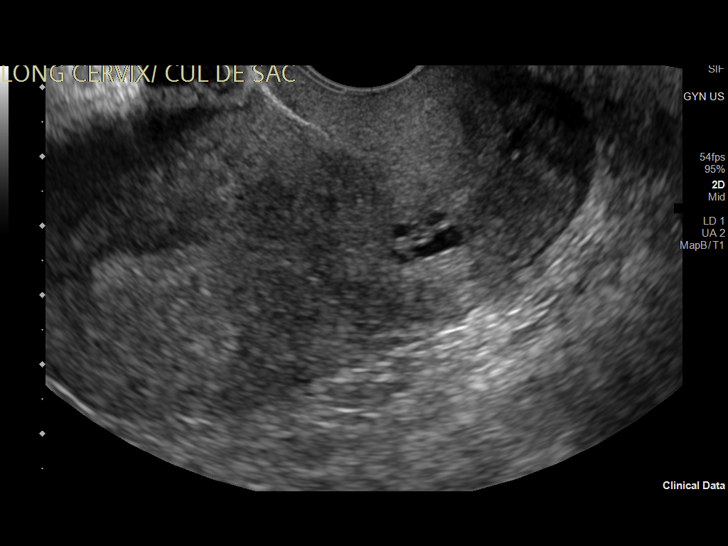
[im 23/90]
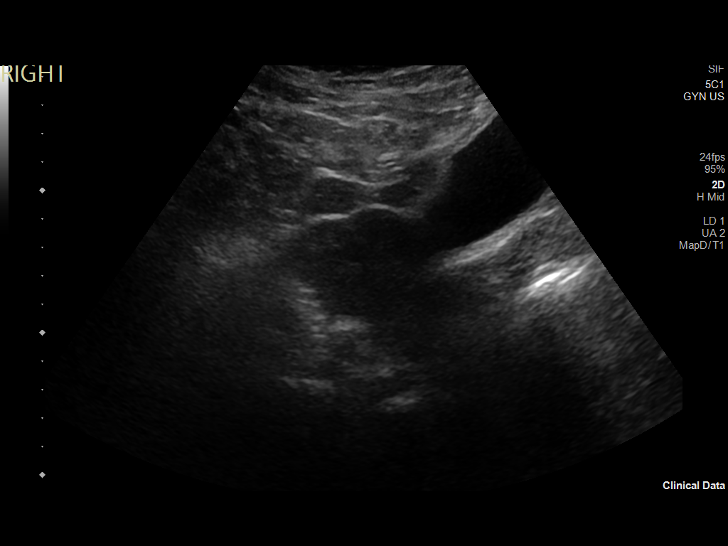
[im 30/90]
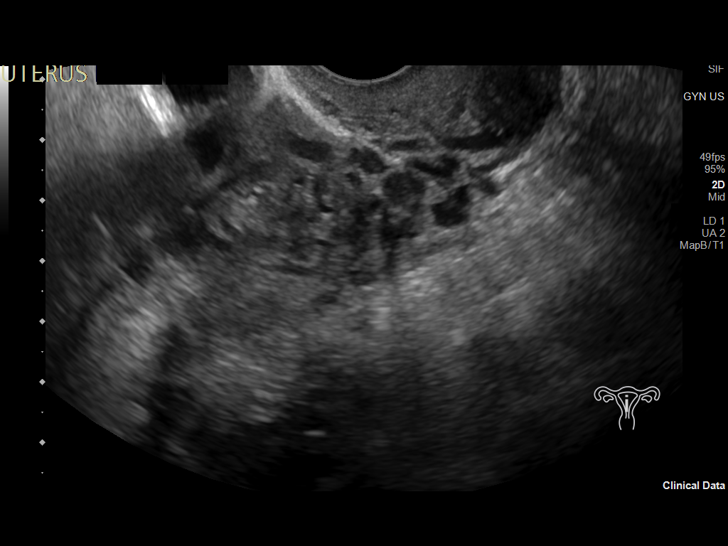
[im 38/90]
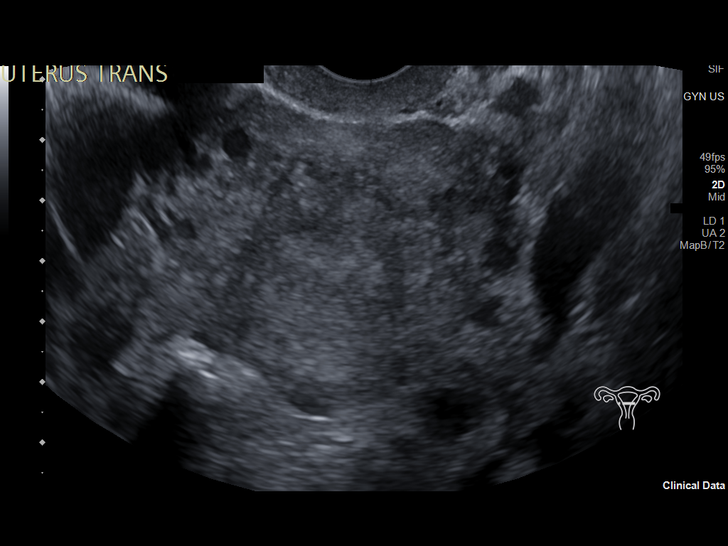
[im 45/90]
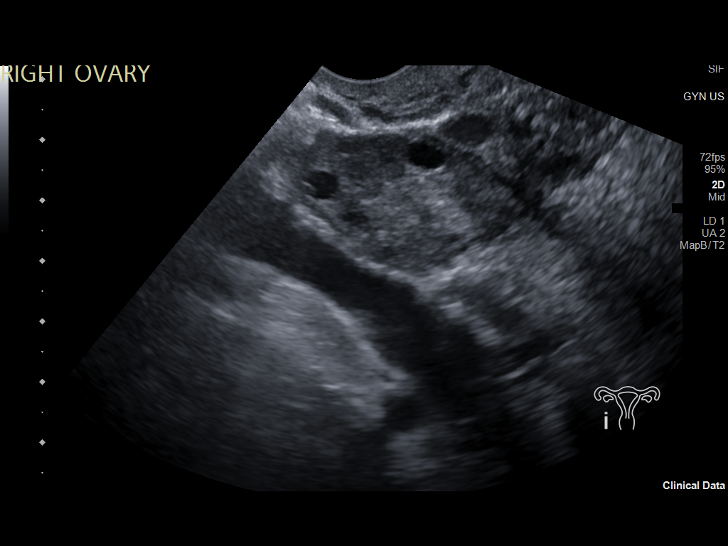
[im 52/90]
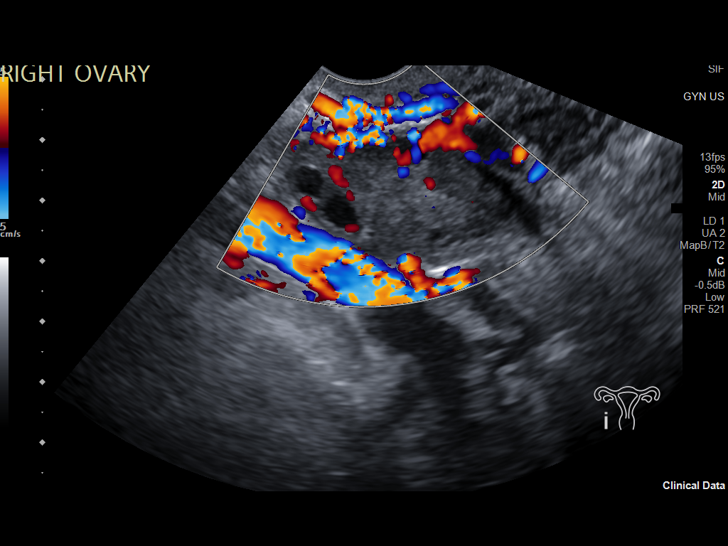
[im 60/90]
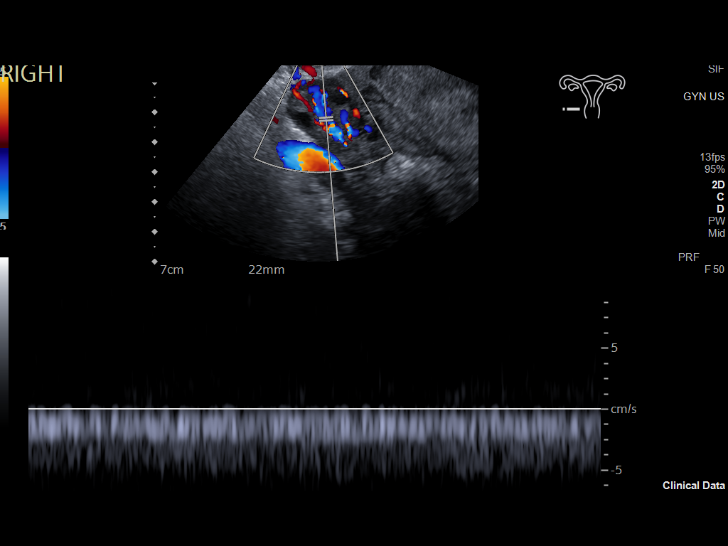
[im 67/90]
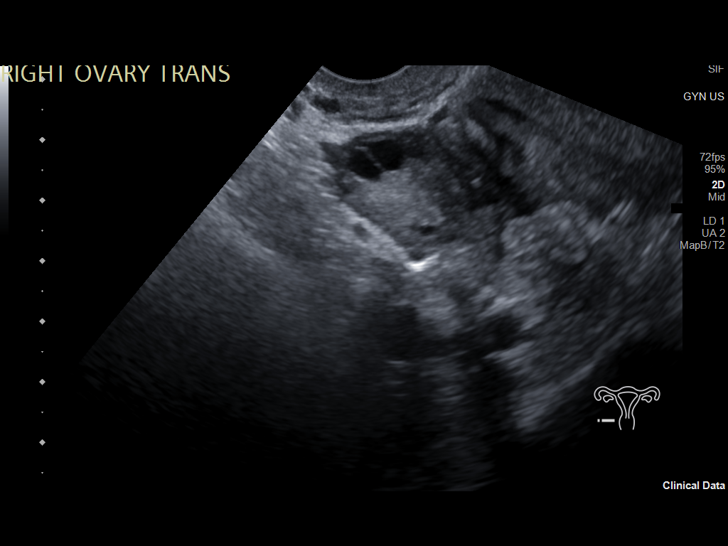
[im 75/90]
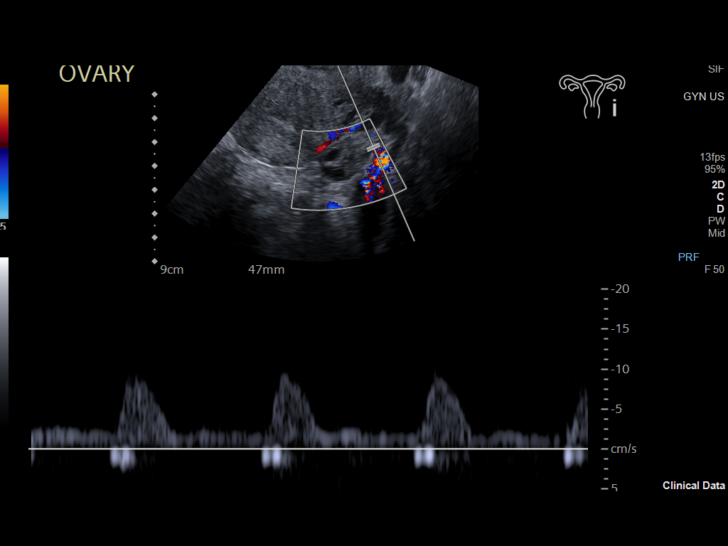
[im 82/90]
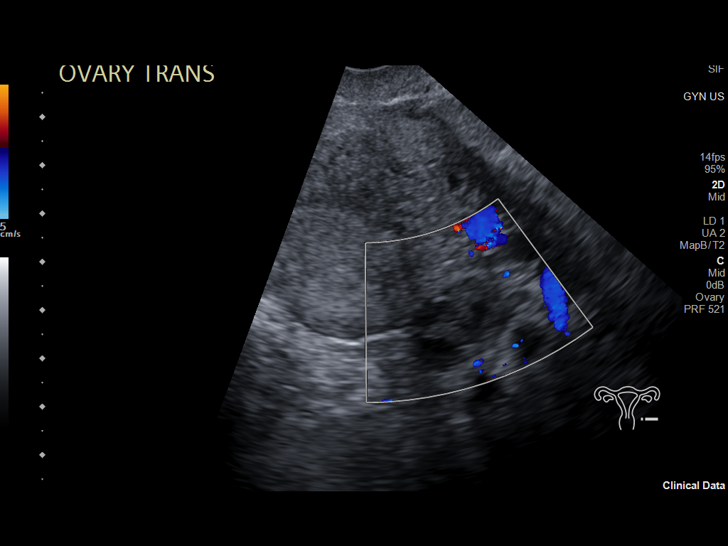
[im 90/90]
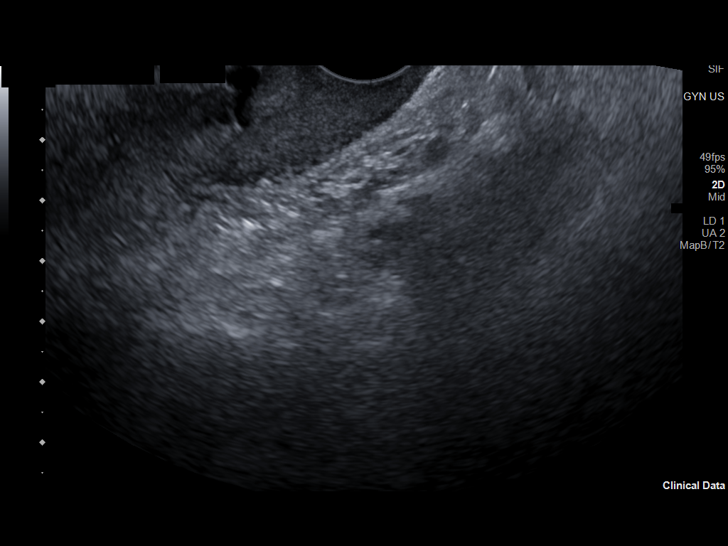

[13 of 25 positions shown; findings below may reference images not displayed]

FINDINGS: Uterus

Measurements: 9.6 x 4.6 x 4.6 cm = volume: 103.9 mL. Anechoic 5 mm
cystic focus at the level of the cervix with increased posterior
through transmission most compatible with a cervical nabothian cyst.

Endometrium

Thickness: 6 mm, non thickened.  No focal abnormality visualized.

Right ovary

Measurements: 3.6 x 2.4 x 2.2 cm = volume: 10.2 mL. Normal
appearance/no adnexal mass.

Left ovary

Measurements: 2.9 x 1.4 x 2.4 cm = volume: 4.8 mL. Normal
appearance/no adnexal mass.

Pulsed Doppler evaluation of both ovaries demonstrates normal
low-resistance arterial and venous waveforms.

Other findings

No abnormal free fluid.
IMPRESSION: Small nabothian cysts at the level of the cervix a typically benign
finding.

No acute pelvic abnormality is seen. Specifically, no evidence of
ovarian torsion.

## 2020-05-30 DIAGNOSIS — J45901 Unspecified asthma with (acute) exacerbation: Secondary | ICD-10-CM | POA: Diagnosis not present

## 2020-05-30 DIAGNOSIS — U071 COVID-19: Secondary | ICD-10-CM | POA: Diagnosis not present

## 2020-05-30 DIAGNOSIS — K5900 Constipation, unspecified: Secondary | ICD-10-CM | POA: Diagnosis not present

## 2020-05-30 DIAGNOSIS — J45909 Unspecified asthma, uncomplicated: Secondary | ICD-10-CM | POA: Diagnosis not present

## 2020-06-03 DIAGNOSIS — Z1152 Encounter for screening for COVID-19: Secondary | ICD-10-CM | POA: Diagnosis not present

## 2020-06-04 DIAGNOSIS — M129 Arthropathy, unspecified: Secondary | ICD-10-CM | POA: Diagnosis not present

## 2020-06-04 DIAGNOSIS — Z79899 Other long term (current) drug therapy: Secondary | ICD-10-CM | POA: Diagnosis not present

## 2020-06-04 DIAGNOSIS — E7800 Pure hypercholesterolemia, unspecified: Secondary | ICD-10-CM | POA: Diagnosis not present

## 2020-06-04 DIAGNOSIS — F17210 Nicotine dependence, cigarettes, uncomplicated: Secondary | ICD-10-CM | POA: Diagnosis not present

## 2020-06-04 DIAGNOSIS — R5383 Other fatigue: Secondary | ICD-10-CM | POA: Diagnosis not present

## 2020-06-04 DIAGNOSIS — E559 Vitamin D deficiency, unspecified: Secondary | ICD-10-CM | POA: Diagnosis not present

## 2020-06-04 DIAGNOSIS — G8929 Other chronic pain: Secondary | ICD-10-CM | POA: Diagnosis not present

## 2020-06-04 DIAGNOSIS — Z1159 Encounter for screening for other viral diseases: Secondary | ICD-10-CM | POA: Diagnosis not present

## 2020-06-04 DIAGNOSIS — M5441 Lumbago with sciatica, right side: Secondary | ICD-10-CM | POA: Diagnosis not present

## 2020-06-04 DIAGNOSIS — Z0000 Encounter for general adult medical examination without abnormal findings: Secondary | ICD-10-CM | POA: Diagnosis not present

## 2020-06-04 DIAGNOSIS — Z131 Encounter for screening for diabetes mellitus: Secondary | ICD-10-CM | POA: Diagnosis not present

## 2020-06-21 DIAGNOSIS — R7303 Prediabetes: Secondary | ICD-10-CM | POA: Diagnosis not present

## 2020-06-21 DIAGNOSIS — Z6835 Body mass index (BMI) 35.0-35.9, adult: Secondary | ICD-10-CM | POA: Diagnosis not present

## 2020-06-21 DIAGNOSIS — E559 Vitamin D deficiency, unspecified: Secondary | ICD-10-CM | POA: Diagnosis not present

## 2020-06-21 DIAGNOSIS — N926 Irregular menstruation, unspecified: Secondary | ICD-10-CM | POA: Diagnosis not present

## 2020-06-21 DIAGNOSIS — F17210 Nicotine dependence, cigarettes, uncomplicated: Secondary | ICD-10-CM | POA: Diagnosis not present

## 2020-07-10 DIAGNOSIS — F1320 Sedative, hypnotic or anxiolytic dependence, uncomplicated: Secondary | ICD-10-CM | POA: Diagnosis not present

## 2020-07-10 DIAGNOSIS — F310 Bipolar disorder, current episode hypomanic: Secondary | ICD-10-CM | POA: Diagnosis not present

## 2020-07-10 DIAGNOSIS — F411 Generalized anxiety disorder: Secondary | ICD-10-CM | POA: Diagnosis not present

## 2020-07-10 DIAGNOSIS — Z1339 Encounter for screening examination for other mental health and behavioral disorders: Secondary | ICD-10-CM | POA: Diagnosis not present

## 2020-07-10 DIAGNOSIS — Z79899 Other long term (current) drug therapy: Secondary | ICD-10-CM | POA: Diagnosis not present

## 2020-09-05 ENCOUNTER — Other Ambulatory Visit: Payer: Self-pay

## 2020-09-05 ENCOUNTER — Emergency Department (HOSPITAL_COMMUNITY)
Admission: EM | Admit: 2020-09-05 | Discharge: 2020-09-05 | Discharge status: Against Medical Advice | Payer: Medicare HMO

## 2020-09-05 NOTE — ED Notes (Signed)
Pt called for triage no answer x3 

## 2020-09-05 NOTE — ED Notes (Signed)
Pt called for triage no answer x1. 

## 2020-09-05 NOTE — ED Notes (Signed)
Called pt for triage no answer x2 

## 2020-11-07 IMAGING — US US PELVIS COMPLETE TRANSABD/TRANSVAG W DUPLEX
1 series · 13 of 25 positions shown · non-contrast
Comparison: Ultrasound 10/05/2019

CLINICAL DATA: Left adnexal pain

EXAM:
TRANSABDOMINAL AND TRANSVAGINAL ULTRASOUND OF PELVIS
DOPPLER ULTRASOUND OF OVARIES
TECHNIQUE: Both transabdominal and transvaginal ultrasound examinations of the
pelvis were performed. Transabdominal technique was performed for
global imaging of the pelvis including uterus, ovaries, adnexal
regions, and pelvic cul-de-sac.
It was necessary to proceed with endovaginal exam following the
transabdominal exam to visualize the uterus endometrium ovaries.
Color and duplex Doppler ultrasound was utilized to evaluate blood
flow to the ovaries.

[Series 1: us pelvic complete w transvaginal and torsion righ · 13 of 107 slices shown]
[im 1/107]
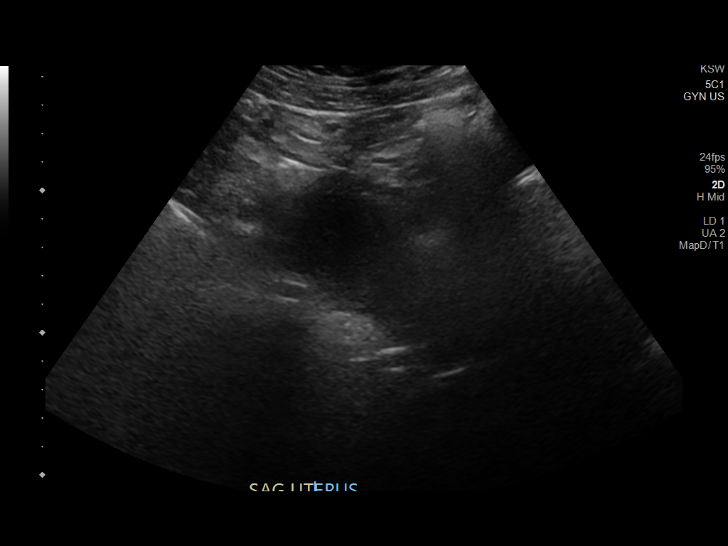
[im 9/107]
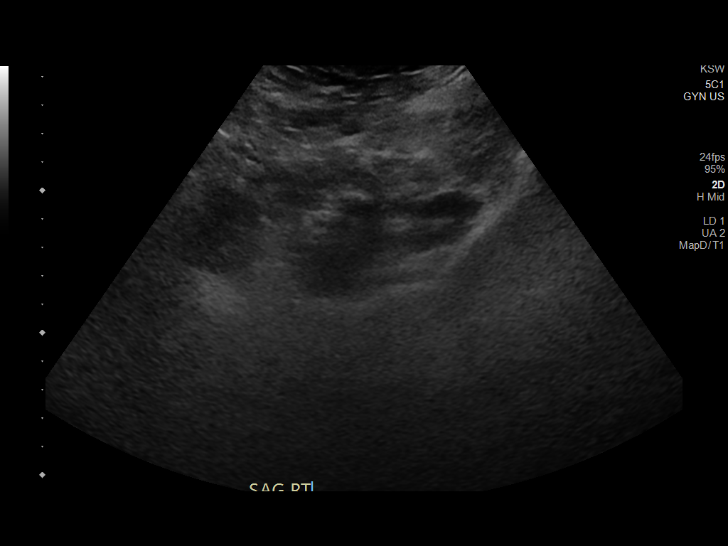
[im 18/107]
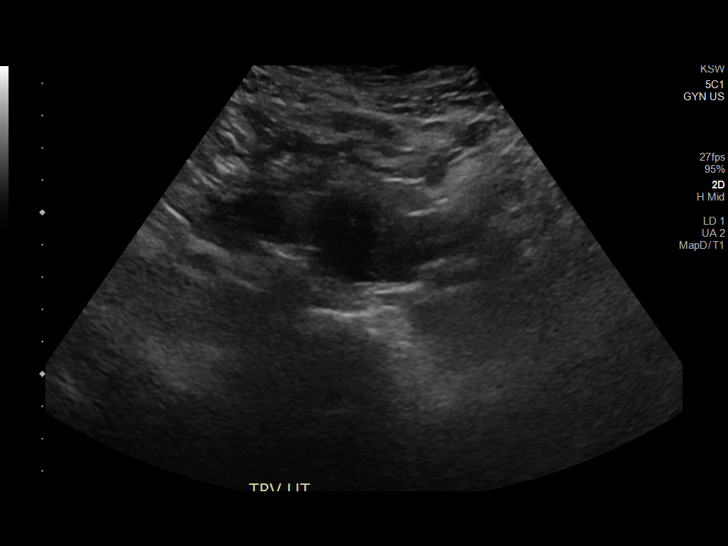
[im 27/107]
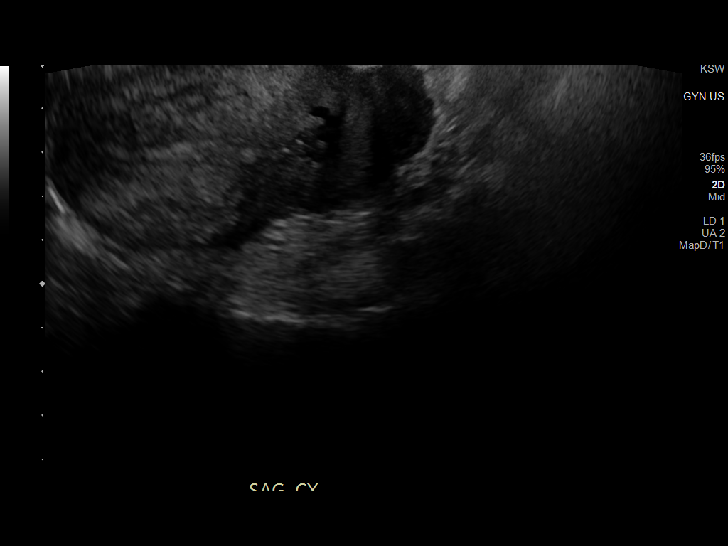
[im 36/107]
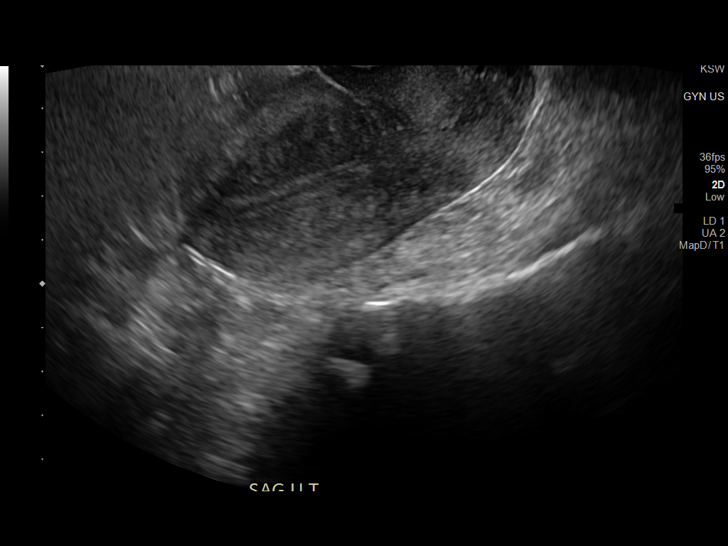
[im 45/107]
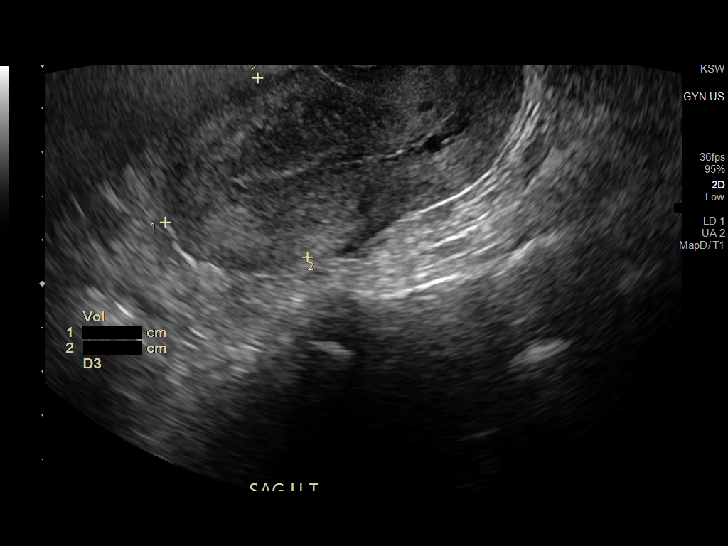
[im 54/107]
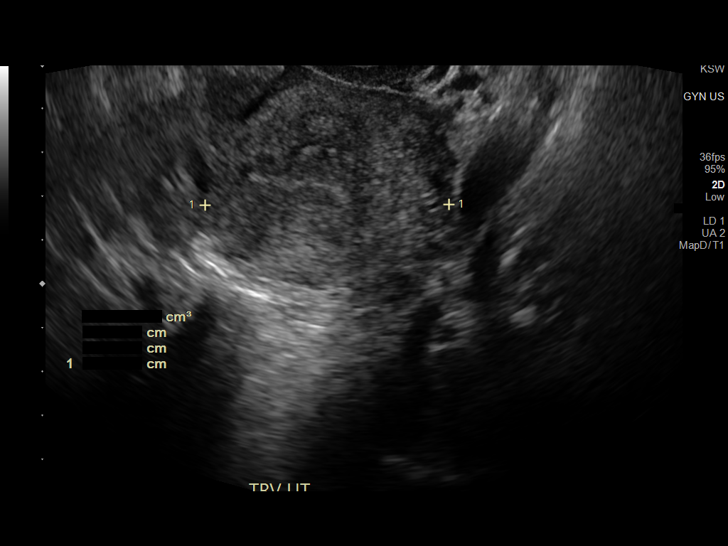
[im 62/107]
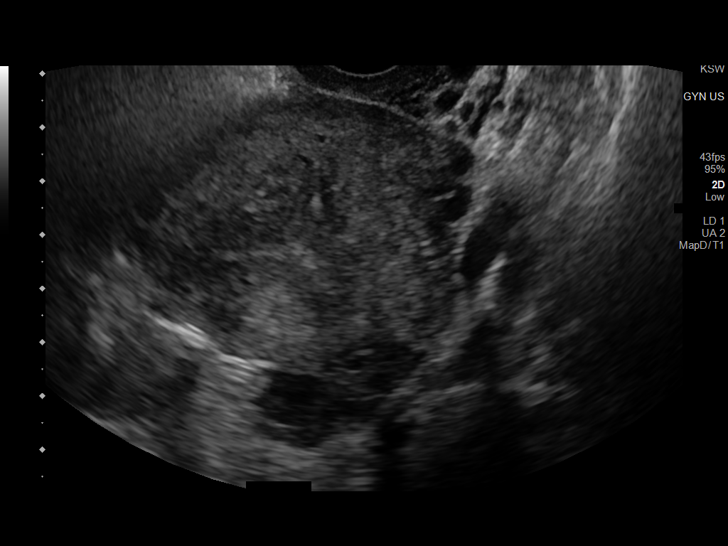
[im 71/107]
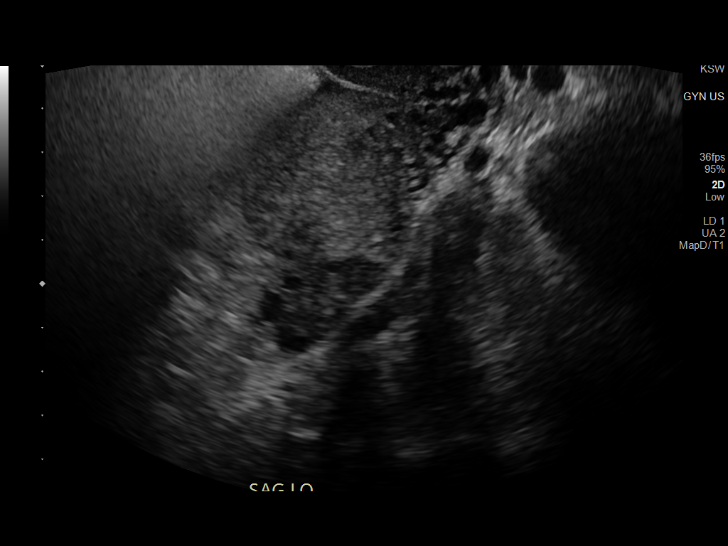
[im 80/107]
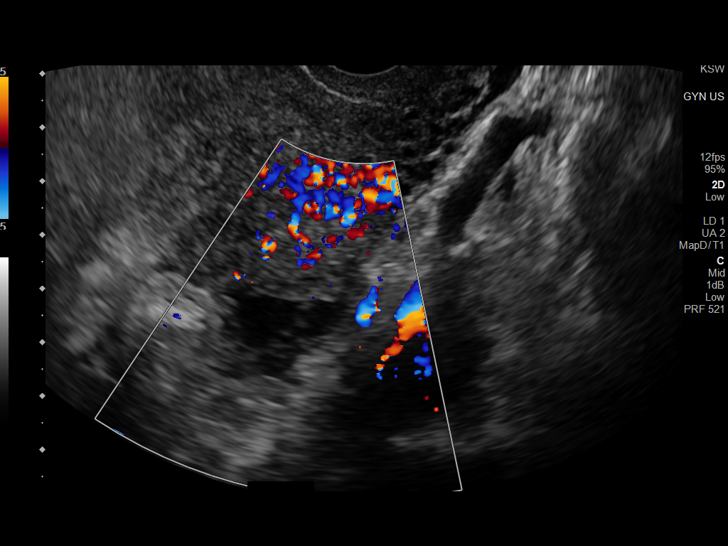
[im 89/107]
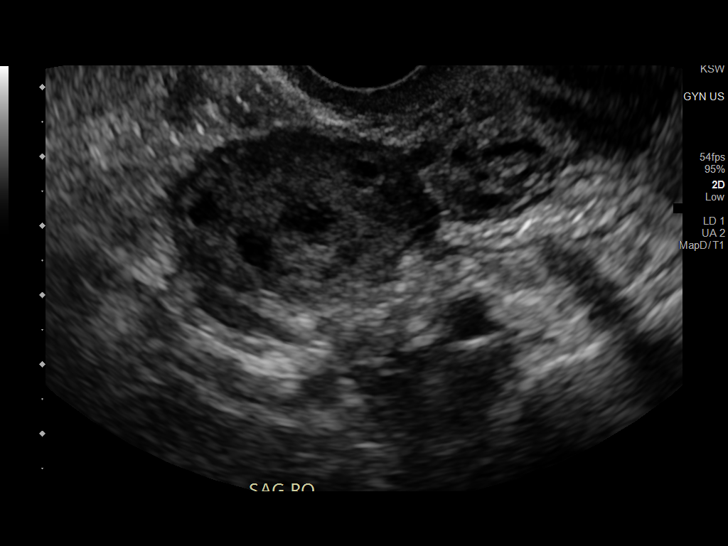
[im 98/107]
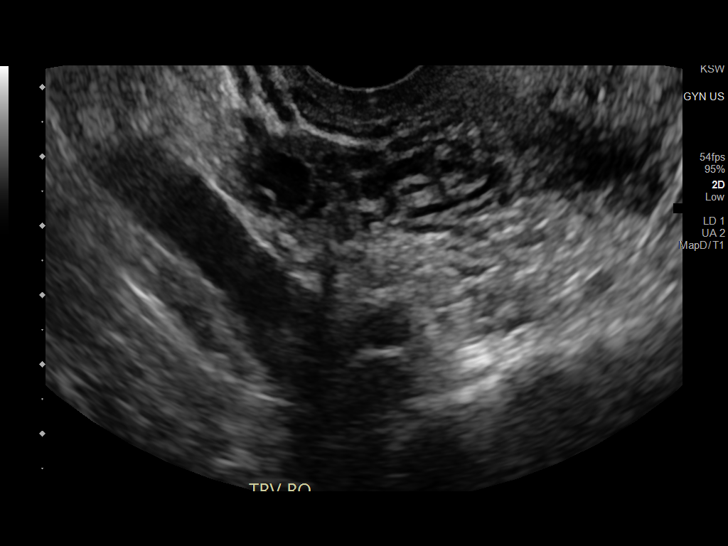
[im 107/107]
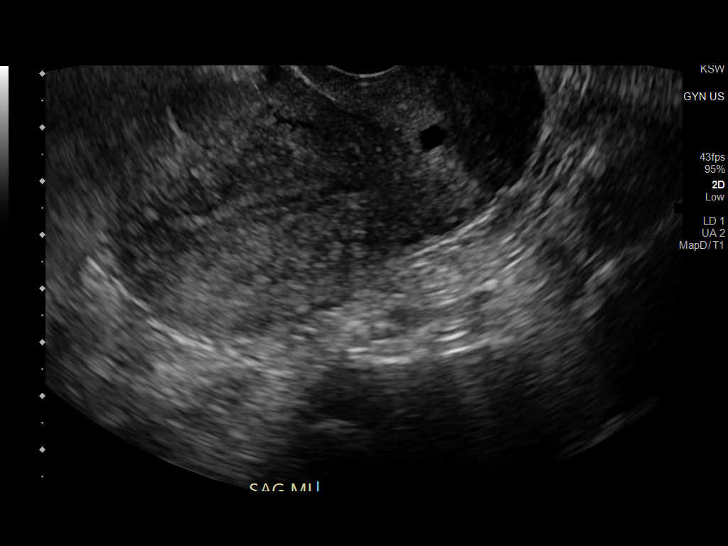

[13 of 25 positions shown; findings below may reference images not displayed]

FINDINGS: Uterus

Measurements: 9 x 4.2 x 5.6 cm = volume: 110.6 mL. No fibroids or
other mass visualized.

Endometrium

Thickness: 5 mm.  No focal abnormality visualized.

Right ovary

Measurements: 3.9 x 2.9 x 3 cm = volume: 18 mL. Normal appearance/no
adnexal mass.

Left ovary

Measurements: 3.3 x 1.8 x 2.7 cm = volume: 8.1 mL. Normal
appearance/no adnexal mass.

Pulsed Doppler evaluation of both ovaries demonstrates normal
low-resistance arterial and venous waveforms.

Other findings

No abnormal free fluid.
IMPRESSION: Negative pelvic ultrasound.  No evidence for ovarian torsion

## 2021-02-12 ENCOUNTER — Other Ambulatory Visit: Payer: Self-pay

## 2021-02-12 ENCOUNTER — Emergency Department (HOSPITAL_COMMUNITY): Payer: Medicare HMO

## 2021-02-12 ENCOUNTER — Emergency Department (HOSPITAL_COMMUNITY)
Admission: EM | Admit: 2021-02-12 | Discharge: 2021-02-12 | Disposition: A | Discharge status: Home / Self Care | Payer: Medicare HMO | Attending: Emergency Medicine | Admitting: Emergency Medicine

## 2021-02-12 ENCOUNTER — Encounter (HOSPITAL_COMMUNITY): Payer: Self-pay | Admitting: *Deleted

## 2021-02-12 DIAGNOSIS — R300 Dysuria: Secondary | ICD-10-CM | POA: Insufficient documentation

## 2021-02-12 DIAGNOSIS — R110 Nausea: Secondary | ICD-10-CM | POA: Insufficient documentation

## 2021-02-12 DIAGNOSIS — N9489 Other specified conditions associated with female genital organs and menstrual cycle: Secondary | ICD-10-CM | POA: Insufficient documentation

## 2021-02-12 DIAGNOSIS — O99511 Diseases of the respiratory system complicating pregnancy, first trimester: Secondary | ICD-10-CM | POA: Insufficient documentation

## 2021-02-12 DIAGNOSIS — Z3A01 Less than 8 weeks gestation of pregnancy: Secondary | ICD-10-CM | POA: Diagnosis not present

## 2021-02-12 DIAGNOSIS — O26851 Spotting complicating pregnancy, first trimester: Secondary | ICD-10-CM | POA: Insufficient documentation

## 2021-02-12 DIAGNOSIS — Z87891 Personal history of nicotine dependence: Secondary | ICD-10-CM | POA: Diagnosis not present

## 2021-02-12 DIAGNOSIS — J45909 Unspecified asthma, uncomplicated: Secondary | ICD-10-CM | POA: Insufficient documentation

## 2021-02-12 DIAGNOSIS — Z79899 Other long term (current) drug therapy: Secondary | ICD-10-CM | POA: Insufficient documentation

## 2021-02-12 DIAGNOSIS — R109 Unspecified abdominal pain: Secondary | ICD-10-CM | POA: Diagnosis not present

## 2021-02-12 DIAGNOSIS — Z3490 Encounter for supervision of normal pregnancy, unspecified, unspecified trimester: Secondary | ICD-10-CM

## 2021-02-12 LAB — URINALYSIS, ROUTINE W REFLEX MICROSCOPIC
Bacteria, UA: NONE SEEN
Bilirubin Urine: NEGATIVE
Glucose, UA: NEGATIVE mg/dL
Hgb urine dipstick: NEGATIVE
Ketones, ur: NEGATIVE mg/dL
Nitrite: NEGATIVE
Protein, ur: NEGATIVE mg/dL
Specific Gravity, Urine: 1.021 (ref 1.005–1.030)
pH: 5 (ref 5.0–8.0)

## 2021-02-12 LAB — COMPREHENSIVE METABOLIC PANEL
ALT: 23 U/L (ref 0–44)
AST: 24 U/L (ref 15–41)
Albumin: 4.2 g/dL (ref 3.5–5.0)
Alkaline Phosphatase: 51 U/L (ref 38–126)
Anion gap: 8 (ref 5–15)
BUN: 9 mg/dL (ref 6–20)
CO2: 26 mmol/L (ref 22–32)
Calcium: 9.6 mg/dL (ref 8.9–10.3)
Chloride: 105 mmol/L (ref 98–111)
Creatinine, Ser: 0.61 mg/dL (ref 0.44–1.00)
GFR, Estimated: >60 mL/min (ref >60)
Glucose, Bld: 86 mg/dL (ref 70–99)
Potassium: 4.4 mmol/L (ref 3.5–5.1)
Sodium: 139 mmol/L (ref 135–145)
Total Bilirubin: 0.5 mg/dL (ref 0.3–1.2)
Total Protein: 8.1 g/dL (ref 6.5–8.1)

## 2021-02-12 LAB — CBC WITH DIFFERENTIAL/PLATELET
Abs Immature Granulocytes: 0.02 10*3/uL (ref 0.00–0.07)
Basophils Absolute: 0 10*3/uL (ref 0.0–0.1)
Basophils Relative: 0 %
Eosinophils Absolute: 0.1 10*3/uL (ref 0.0–0.5)
Eosinophils Relative: 1 %
HCT: 39.3 % (ref 36.0–46.0)
Hemoglobin: 12.7 g/dL (ref 12.0–15.0)
Immature Granulocytes: 0 %
Lymphocytes Relative: 44 %
Lymphs Abs: 2.5 10*3/uL (ref 0.7–4.0)
MCH: 27.6 pg (ref 26.0–34.0)
MCHC: 32.3 g/dL (ref 30.0–36.0)
MCV: 85.4 fL (ref 80.0–100.0)
Monocytes Absolute: 0.7 10*3/uL (ref 0.1–1.0)
Monocytes Relative: 12 %
Neutro Abs: 2.4 10*3/uL (ref 1.7–7.7)
Neutrophils Relative %: 43 %
Platelets: 166 10*3/uL (ref 150–400)
RBC: 4.6 MIL/uL (ref 3.87–5.11)
RDW: 13.7 % (ref 11.5–15.5)
WBC: 5.7 10*3/uL (ref 4.0–10.5)
nRBC: 0 % (ref 0.0–0.2)

## 2021-02-12 LAB — WET PREP, GENITAL
Clue Cells Wet Prep HPF POC: NONE SEEN
Sperm: NONE SEEN
Trich, Wet Prep: NONE SEEN
Yeast Wet Prep HPF POC: NONE SEEN

## 2021-02-12 LAB — I-STAT BETA HCG BLOOD, ED (MC, WL, AP ONLY): I-stat hCG, quantitative: 19.8 m[IU]/mL — ABNORMAL HIGH (ref <5)

## 2021-02-12 LAB — HCG, QUANTITATIVE, PREGNANCY: hCG, Beta Chain, Quant, S: 21 m[IU]/mL — ABNORMAL HIGH (ref <5)

## 2021-02-12 LAB — PREGNANCY, URINE: Preg Test, Ur: NEGATIVE

## 2021-02-12 MED ORDER — ACETAMINOPHEN 500 MG PO TABS
1000.0000 mg | ORAL_TABLET | Freq: Once | ORAL | Status: AC
  Administered 2021-02-12: 1000 mg via ORAL
  Filled 2021-02-12: qty 2

## 2021-02-12 MED ORDER — ONDANSETRON 4 MG PO TBDP: 4.0000 mg | ORAL_TABLET | Freq: Three times a day (TID) | ORAL | 0 refills | Status: DC | PRN

## 2021-02-12 MED ORDER — CEPHALEXIN 500 MG PO CAPS: 500.0000 mg | ORAL_CAPSULE | Freq: Two times a day (BID) | ORAL | 0 refills | Status: AC

## 2021-02-12 MED ORDER — MORPHINE SULFATE (PF) 4 MG/ML IV SOLN
4.0000 mg | Freq: Once | INTRAVENOUS | Status: AC
  Administered 2021-02-12: 4 mg via INTRAVENOUS
  Filled 2021-02-12: qty 1

## 2021-02-12 MED ORDER — SODIUM CHLORIDE 0.9 % IV BOLUS
1000.0000 mL | Freq: Once | INTRAVENOUS | Status: AC
  Administered 2021-02-12: 1000 mL via INTRAVENOUS

## 2021-02-12 NOTE — Discharge Instructions (Addendum)
  As we discussed your pregnancy test was positive today.  It is very important that you get your hCG level checked in 2 days, as we discussed.  You can either go to the MAU, their information as above.  It is right next to the [facility] health emergency department.  Make sure to ask for the MAU.  I also referred you to the women's outpatient clinic.  Please begin taking prenatal vitamins, I also gave you antibiotics for your UTI.  If you have any new or worsening concerning symptoms he is back to the emerge department.  I also prescribed you some nausea medication.  Your STD results will become back on MyChart in the next 48 hours, make sure to follow-up on these.  If they are positive you will need to return for treatment. Please return to the Emergency Department if you experience any worsening of your condition.  Thank you for allowing [place] to be a part of your care. Please speak to your pharmacist about any new medications prescribed today in regards to side effects or interactions with other medications.

## 2021-02-12 NOTE — ED Provider Notes (Signed)
[facility]-EMERGENCY DEPT Provider Note   CSN: [id number] Arrival date & time: 02/12/21  1154     History Chief Complaint  Patient presents with   Flank Pain    Brianna Booth is a 35 y.o. female presents the emerge department today for flank pain and dysuria for the past couple of days.  Patient states that she started having dysuria about 4 to 5 days ago, also admits to darker urine, denies any gross hematuria.  Patient also states that she is having flank pain on the right side in addition to the left side however worse on the right side.  Patient states that she does have a history of kidney stones.  Patient states that the pain will radiate down into her abdomen at times.  Patient states the pain comes and goes every 10 minutes, states that when it occurs she feels nauseous.  States that she did vomit about 3 days ago, however has not vomited since.  Denies any diarrhea.  Patient also admits to having intercourse a week ago with her ex, he does not have any known STDs or symptoms.  States that she has been having vaginal discharge for the past week as well that is slightly different..  Denies any vaginal bleeding.  Denies any chest pain, shortness of breath, fevers, myalgias, cough or URI symptoms..  Has been taking ibuprofen and Tylenol at home without relief.  Patient had menstrual cycle last month, denies any chance of pregnancy.  Patient does denies protection or OCPs.  HPI     Past Medical History:  Diagnosis Date   Anxiety    Asthma    Depression    Headache    Pregnancy induced hypertension    Preterm labor     Patient Active Problem List   Diagnosis Date Noted   SVD (spontaneous vaginal delivery) 07/04/2018   Gestational diabetes mellitus (GDM) in third trimester 07/01/2018   Gestational hypertension 07/01/2018   History of preterm delivery, currently pregnant 06/12/2018   Anxiety and depression 06/12/2018   Supervision of other normal pregnancy,  antepartum 04/07/2018   Hx of preeclampsia, prior pregnancy, currently pregnant, second trimester 02/23/2018   Bell's palsy 03/05/2017    Past Surgical History:  Procedure Laterality Date   DILATION AND CURETTAGE OF UTERUS       OB History     Gravida  13   Para  8   Term  3   Preterm  5   AB  5   Living  8      SAB  3   IAB  2   Ectopic      Multiple  0   Live Births  8           Family History  Problem Relation Age of Onset   Hypertension Father     Social History   Tobacco Use   Smoking status: Former    Types: Cigarettes    Quit date: 12/12/2017    Years since quitting: 3.1   Smokeless tobacco: Never  Vaping Use   Vaping Use: Never used  Substance Use Topics   Alcohol use: Never   Drug use: Never    Home Medications Prior to Admission medications   Medication Sig Start Date End Date Taking? Authorizing Provider  acetaminophen (TYLENOL) 500 MG tablet Take 1,000 mg by mouth every 6 (six) hours as needed for moderate pain.   Yes [provider]  albuterol (PROVENTIL HFA;VENTOLIN HFA) 108 (90  Base) MCG/ACT inhaler Inhale 1-2 puffs into the lungs every 6 (six) hours as needed for wheezing or shortness of breath.   Yes [provider]  ARIPiprazole (ABILIFY) 20 MG tablet Take 20 mg by mouth at bedtime. 10/04/19  Yes [provider]  busPIRone (BUSPAR) 15 MG tablet Take 15 mg by mouth 2 (two) times daily. 01/17/21  Yes [provider]  cephALEXin (KEFLEX) 500 MG capsule Take 1 capsule (500 mg total) by mouth 2 (two) times daily for 7 days. 02/12/21 02/19/21 Yes Teanna Elem, PA-C  clonazePAM (KLONOPIN) 1 MG tablet Take 1 mg by mouth 3 (three) times daily as needed for anxiety. 01/17/21  Yes [provider]  cloNIDine (CATAPRES) 0.1 MG tablet Take 0.1 mg by mouth 2 (two) times daily as needed. 01/17/21  Yes [provider]  hydrOXYzine (VISTARIL) 50 MG capsule Take 50 mg by mouth 4 (four) times daily as  needed for anxiety.  09/20/19  Yes [provider]  lamoTRIgine (LAMICTAL) 150 MG tablet Take 150 mg by mouth at bedtime. 10/04/19  Yes [provider]  montelukast (SINGULAIR) 10 MG tablet Take 10 mg by mouth daily. 01/17/21  Yes [provider]  NIFEdipine (ADALAT CC) 30 MG 24 hr tablet Take 1 tablet (30 mg total) by mouth daily. 07/07/18  Yes Swansea Bing, MD  ondansetron (ZOFRAN ODT) 4 MG disintegrating tablet Take 1 tablet (4 mg total) by mouth every 8 (eight) hours as needed for nausea or vomiting. 02/12/21  Yes Farrel Gordon, PA-C  ibuprofen (ADVIL,MOTRIN) 600 MG tablet Take 1 tablet (600 mg total) by mouth every 6 (six) hours. Patient not taking: No sig reported 07/06/18   Parks Bing, MD  Prenatal Multivit-Min-Fe-FA (PRENATAL VITAMINS) 0.8 MG tablet Take 1 tablet by mouth daily. Patient not taking: No sig reported 04/07/18   Raelyn Mora, CNM    Allergies    Patient has no known allergies.  Review of Systems   Review of Systems  Constitutional:  Negative for chills, diaphoresis, fatigue and fever.  HENT:  Negative for congestion, sore throat and trouble swallowing.   Eyes:  Negative for pain and visual disturbance.  Respiratory:  Negative for cough, shortness of breath and wheezing.   Cardiovascular:  Negative for chest pain, palpitations and leg swelling.  Gastrointestinal:  Positive for nausea. Negative for abdominal distention, abdominal pain, diarrhea and vomiting.  Genitourinary:  Positive for dysuria, flank pain and vaginal discharge. Negative for difficulty urinating, frequency, genital sores, hematuria, menstrual problem and pelvic pain.  Musculoskeletal:  Negative for back pain, neck pain and neck stiffness.  Skin:  Negative for pallor.  Neurological:  Negative for dizziness, speech difficulty, weakness and headaches.  Psychiatric/Behavioral:  Negative for confusion.    Physical Exam Updated Vital Signs BP (!) 174/80   Pulse 88   Temp  98.5 F (36.9 C) (Oral)   Resp 18   LMP 01/17/2021   SpO2 96%   Physical Exam Constitutional:      General: She is not in acute distress.    Appearance: Normal appearance. She is not ill-appearing, toxic-appearing or diaphoretic.  HENT:     Mouth/Throat:     Mouth: Mucous membranes are moist.     Pharynx: Oropharynx is clear.  Eyes:     General: No scleral icterus.    Extraocular Movements: Extraocular movements intact.     Pupils: Pupils are equal, round, and reactive to light.  Cardiovascular:     Rate and Rhythm: Normal rate and  regular rhythm.     Pulses: Normal pulses.     Heart sounds: Normal heart sounds.  Pulmonary:     Effort: Pulmonary effort is normal. No respiratory distress.     Breath sounds: Normal breath sounds. No stridor. No wheezing, rhonchi or rales.  Chest:     Chest wall: No tenderness.  Abdominal:     General: Abdomen is flat. There is no distension.     Palpations: Abdomen is soft.     Tenderness: There is no abdominal tenderness. There is no right CVA tenderness, left CVA tenderness, guarding or rebound.     Comments: Flank pain to right and left side, no ecchymosis.  No true CVA tenderness.  Genitourinary:    Comments: Chaperone present.  Normal external vaginal exam.  Patient without any abnormal vaginal discharge.  Cervix is closed.  No cervical motion tenderness, very minimal left adnexal tenderness, describes it as pressure. Musculoskeletal:        General: No swelling or tenderness. Normal range of motion.     Cervical back: Normal range of motion and neck supple. No rigidity.     Right lower leg: No edema.     Left lower leg: No edema.  Skin:    General: Skin is warm and dry.     Capillary Refill: Capillary refill takes less than 2 seconds.     Coloration: Skin is not pale.  Neurological:     General: No focal deficit present.     Mental Status: She is alert and oriented to person, place, and time.  Psychiatric:        Mood and Affect:  Mood normal.        Behavior: Behavior normal.    ED Results / Procedures / Treatments   Labs (all labs ordered are listed, but only abnormal results are displayed) Labs Reviewed  WET PREP, GENITAL - Abnormal; Notable for the following components:      Result Value   WBC, Wet Prep HPF POC MODERATE (*)    All other components within normal limits  URINALYSIS, ROUTINE W REFLEX MICROSCOPIC - Abnormal; Notable for the following components:   Leukocytes,Ua MODERATE (*)    All other components within normal limits  HCG, QUANTITATIVE, PREGNANCY - Abnormal; Notable for the following components:   hCG, Beta Chain, Quant, S 21 (*)    All other components within normal limits  I-STAT BETA HCG BLOOD, ED ([facility], [facility], [facility] ONLY) - Abnormal; Notable for the following components:   I-stat hCG, quantitative 19.8 (*)    All other components within normal limits  URINE CULTURE  CBC WITH DIFFERENTIAL/PLATELET  COMPREHENSIVE METABOLIC PANEL  PREGNANCY, URINE  GC/CHLAMYDIA PROBE AMP ([place]) NOT AT [facility]    EKG None  Radiology US OB Comp < 14 Wks  Result Date: 02/12/2021 CLINICAL DATA:  35 year old pregnant female with left lower quadrant abdominal pain. LMP: 01/17/2021 corresponding to an estimated gestational age [redacted] weeks, 5 days. EXAM: OBSTETRIC <14 WK ULTRASOUND TECHNIQUE: Transabdominal ultrasound was performed for evaluation of the gestation as well as the maternal uterus and adnexal regions. COMPARISON:  None. FINDINGS: The uterus is anteverted and appears unremarkable. The endometrium measures 3 mm in thickness and appears unremarkable. No intrauterine pregnancy identified. The ovaries are unremarkable. The right ovary measures 4.0 x 2.8 x 3.2 cm for a volume of 18 cc and the left ovary measures 2.8 x 2.2 x 2.4 cm for a volume of 8 cc. No significant free fluid in the  pelvis. IMPRESSION: No intrauterine pregnancy identified and no suspicious adnexal masses noted. Findings consistent with pregnancy  of unknown location and differential diagnosis include: An early IUP, recent spontaneous miscarriage, or an occult ectopic pregnancy. Correlation with clinical exam and follow-up with serial HCG levels and repeat ultrasound as clinically indicated, recommended. Electronically Signed   By: Elgie Collard M.D.   On: 02/12/2021 18:14   US Renal  Result Date: 02/12/2021 CLINICAL DATA:  35 year old female with flank pain. History of kidney stones. EXAM: RENAL / URINARY TRACT ULTRASOUND COMPLETE COMPARISON:  None. FINDINGS: Right Kidney: Renal measurements: 11.9 x 5.7 x 6.8 cm = volume: 243 mL. Echogenicity within normal limits. No mass or hydronephrosis visualized. Left Kidney: Renal measurements: 14.2 x 5.7 x 7.0 cm = volume: 292 mL. Echogenicity within normal limits. No mass or hydronephrosis visualized. Bladder: Appears normal for degree of bladder distention. Other: None. IMPRESSION: Unremarkable renal ultrasound. Electronically Signed   By: Elgie Collard M.D.   On: 02/12/2021 18:09    Procedures Procedures   Medications Ordered in ED Medications  sodium chloride 0.9 % bolus 1,000 mL (0 mLs Intravenous Stopped 02/12/21 1927)  morphine 4 MG/ML injection 4 mg (4 mg Intravenous Given 02/12/21 1401)  acetaminophen (TYLENOL) tablet 1,000 mg (1,000 mg Oral Given 02/12/21 1756)    ED Course  I have reviewed the triage vital signs and the nursing notes.  Pertinent labs & imaging results that were available during my care of the patient were reviewed by me and considered in my medical decision making (see chart for details).    MDM Rules/Calculators/A&P                           35 year old female who presents emerged from today for flank pain nausea and dysuria.  Patient is well-appearing, nontoxic, is not currently nauseous.  Patient does have bilateral flank pain, differential to include nephrolithiasis, UTI.  in addition patient is admitting to some vaginal discharge after intercourse with her  ex, will do pelvic exam.  Pelvic exam benign, i-STAT pregnancy came back positive at 19.8, was able to obtain hCG quantitative which came back at 21.  This could either be a lab error or patient can be in first week of pregnancy.  Did discuss in depth with patient.  It is feasible that patient could be in her first week of pregnancy from the time she had intercourse.  Could also be the cause of patient's symptoms of back pain some nausea and abnormal vaginal discharge.  Did give morphine prior to patient's i-STAT coming back when patient told me she was not pregnant.  However unable to obtain CT imaging.  Was able to obtain renal and pelvic ultrasound.  Both unremarkable.  Able to successfully rule out hydronephrosis, urinalysis does not show any blood.  Low likelihood for nephrolithiasis.  UA does show some leukocytes, with questionable pregnancy and patient dysuria will treat at this time.  No concerns for pyelonephritis with only leukocytes, no white count, no current nausea or vomiting, afebrile.  CBC and CMP unremarkable.  Wet prep does show some white blood cells, no trichomonas or clue cells seen.  STD results will be pending, did discuss still findings on MyChart.  Patient did not have any exposures to confirmed STDs, partner is not having any OCD-like symptoms.  We will hold off on treatment until STD symptoms come back, no concerns for PID.  Patient is afebrile, no white count, no  cervical motion tenderness.  IMPRESSION:  No intrauterine pregnancy identified and no suspicious adnexal  masses noted. Findings consistent with pregnancy of unknown location  and differential diagnosis include: An early IUP, recent spontaneous  miscarriage, or an occult ectopic pregnancy. Correlation with  clinical exam and follow-up with serial HCG levels and repeat  ultrasound as clinically indicated, recommended.      Did discuss these findings in depth with patient in regards to pelvic ultrasound and other  findings.  Patient will follow up in 2 days for follow-up hCG.  Did also give Zofran, discussed the risks of this in regards to pregnancy, patient wants to proceed.  Low likelihood for miscarriage, no vaginal bleeding.  Do not think that patient is having an ectopic, however patient will follow up in 2 days in regards to following up for hCG quant.  Patient aware of findings, I think symptoms do point to pregnancy.  Patient appears very well, to be discharged at this time.  Doubt need for further emergent work up at this time. I explained the diagnosis and have given explicit precautions to return to the ER including for any other new or worsening symptoms. The patient understands and accepts the medical plan as it's been dictated and I have answered their questions. Discharge instructions concerning home care and prescriptions have been given. The patient is STABLE and is discharged to home in good condition.    final Clinical Impression(s) / ED Diagnoses Final diagnoses:  Early stage of pregnancy  Dysuria    Rx / DC Orders ED Discharge Orders          Ordered    ondansetron (ZOFRAN ODT) 4 MG disintegrating tablet  Every 8 hours PRN        02/12/21 1901    cephALEXin (KEFLEX) 500 MG capsule  2 times daily        02/12/21 1901             Farrel Gordonatel, Dawsen Krieger, PA-C 02/12/21 1944    Alvira MondaySchlossman, Erin, MD 02/13/21 1057

## 2021-02-12 NOTE — ED Triage Notes (Signed)
Pt complains of left flank pain and dysuria x 1 week.

## 2021-02-13 LAB — GC/CHLAMYDIA PROBE AMP (~~LOC~~) NOT AT ARMC
Chlamydia: NEGATIVE
Comment: NEGATIVE
Comment: NORMAL
Neisseria Gonorrhea: NEGATIVE

## 2021-02-14 LAB — URINE CULTURE

## 2021-02-18 ENCOUNTER — Inpatient Hospital Stay (HOSPITAL_COMMUNITY)
Admission: EM | Admit: 2021-02-18 | Discharge: 2021-02-18 | Disposition: A | Discharge status: Home / Self Care | Payer: Medicare HMO | Attending: Obstetrics & Gynecology | Admitting: Obstetrics & Gynecology

## 2021-02-18 ENCOUNTER — Other Ambulatory Visit: Payer: Self-pay

## 2021-02-18 ENCOUNTER — Encounter (HOSPITAL_COMMUNITY): Payer: Self-pay

## 2021-02-18 DIAGNOSIS — O3680X0 Pregnancy with inconclusive fetal viability, not applicable or unspecified: Secondary | ICD-10-CM | POA: Diagnosis present

## 2021-02-18 DIAGNOSIS — Z3A01 Less than 8 weeks gestation of pregnancy: Secondary | ICD-10-CM | POA: Insufficient documentation

## 2021-02-18 DIAGNOSIS — Z6790 Unspecified blood type, Rh positive: Secondary | ICD-10-CM | POA: Insufficient documentation

## 2021-02-18 LAB — HCG, QUANTITATIVE, PREGNANCY: hCG, Beta Chain, Quant, S: 1041 m[IU]/mL — ABNORMAL HIGH (ref <5)

## 2021-02-18 NOTE — MAU Note (Signed)
Brianna Booth is a 35 y.o. here in MAU reporting: here for follow up hcg. Still having right sided back pain. No bleeding.  LMP: 01/20/21  Onset of complaint: ongoing  Pain score: 10/10  Vitals:   02/18/21 2000  BP: (!) 124/94  Pulse: 96  Resp: 16  Temp: 99.5 F (37.5 C)  SpO2: 98%     Lab orders placed from triage: hcg

## 2021-02-18 NOTE — MAU Provider Note (Signed)
Subjective:  Brianna Booth is a 35 y.o. [id number] at Unknown who presents today for FU BHCG. She was seen on 02/12/2021 at [facility] and was diagnosed with a pregnancy of unknown anatomic location and adivsed to be seen in MAU 48 hours later for a f/u hCG. Patient reports she was not able to come 48 hours later because of childcare issues, so she presents today. Results from that day show no IUP on [place], and HCG 21. She denies vaginal bleeding. She denies abdominal or pelvic pain.   Objective:  Physical Exam  Nursing note and vitals reviewed.  Patient Vitals for the past 24 hrs:  BP Temp Temp src Pulse Resp SpO2 Height Weight  02/18/21 2000 (!) 124/94 99.5 F (37.5 C) Oral 96 16 98 % -- --  02/18/21 1956 -- -- -- -- -- -- 5\' 7"  (1.702 m) 112.9 kg   Constitutional: She is oriented to person, place, and time. She appears well-developed and well-nourished. No distress.  HENT:  Head: Normocephalic.  Cardiovascular: Normal rate.  Respiratory: Effort normal.  GI: Soft. There is no tenderness.  Neurological: She is alert and oriented to person, place, and time. Skin: Skin is warm and dry.  Psychiatric: She has a normal mood and affect.   Discussed with client the diagnosis of pregnancy of unknown anatomic location.  Three possibilities of outcome are: a healthy pregnancy that is too early to see a yolk sac to confirm the pregnancy is in the uterus, a pregnancy that is not healthy and has not developed and will not develop, and an ectopic pregnancy that is in the abdomen that cannot be identified at this time.  And ectopic pregnancy can be a life threatening situation as a pregnancy needs to be in the uterus which is a muscle and can stretch to accommodate the growth of a pregnancy.  Other structures in the pelvis and abdomen as not muscular and do not stretch with the growth of a pregnancy.  Worst case scenario is that a structure ruptures with a growing pregnancy not in the uterus and and internal  hemorrhage can be a life threatening situation.  We need to follow the progression of this pregnancy carefully.  We need to check another serum pregnancy hormone level to determine if the levels are rising appropriately  and to determine the next steps that are needed for you. Patient's questions were answered.  Results for orders placed or performed during the hospital encounter of 02/18/21 (from the past 24 hour(s))  hCG, quantitative, pregnancy     Status: Abnormal   Collection Time: 02/18/21  8:22 PM  Result Value Ref Range   hCG, Beta Chain, Quant, S 1,041 (H) <5 mIU/mL    Assessment/Plan: Pregnancy of unknown location HCG did seem to rise appropriately, however difficult to determine d/t greater than 48 hour time period since last hCG FU in 2 days for: repeat hCG Repeat [facility]CG scheduled at [facility] on 02/21/2021 at 830AM (pt confirms she can make it to office at this time) Pt discharged to home in stable condition  Cyler Kappes, 04/23/2021, NP  9:34 PM 02/18/2021

## 2021-02-18 NOTE — Discharge Instructions (Signed)
Prenatal Care Providers ? ?         ?[facility] @ [facility] - accepts patients without insurance ? Phone: [phone] ? ?[facility] @ [facility]  ? Phone: [phone] ? ?[facility]      ? Phone: [phone]   ?         ?[facility] @ [place]    ? Phone: [phone] ?         ?[facility] @ [place]  ? Phone: [phone] ? ?[facility] @ [facility] - accepts patients without insurance ? Phone: [phone] ? ?[facility] @ [facility] ?Phone: [phone] ?    ?[facility] - accepts patients without insurance ?Phone: [phone] ? ?[facility]  ?Phone: [phone] ? ?[facility] ?Phone: [phone] ? ?[facility] ?Phone: [phone] ? ?[facility] ?Phone: [phone] ? ?[facility] ?Phone: [phone] ? ?[facility]  ?Phone: [phone] ? ? ? ? ? ? ?                 Safe Medications in Pregnancy  ? ? ?Acne: ?Benzoyl Peroxide ?Salicylic Acid ? ?Backache/Headache: ?Tylenol: 2 regular strength every 4 hours OR ?             2 Extra strength every 6 hours ? ?Colds/Coughs/Allergies: ?Benadryl (alcohol free) 25 mg every 6 hours as needed ?Breath right strips ?Claritin ?Cepacol throat lozenges ?Chloraseptic throat spray ?Cold-Eeze- up to three times per day ?Cough drops, alcohol free ?Flonase (by prescription only) ?Guaifenesin ?Mucinex ?Robitussin DM (plain only, alcohol free) ?Saline nasal spray/drops ?Sudafed (pseudoephedrine) & Actifed ** use only after [redacted] weeks gestation and if you do not have high blood pressure ?Tylenol ?Vicks Vaporub ?Zinc lozenges ?Zyrtec  ? ?Constipation: ?Colace ?Ducolax suppositories ?Fleet enema ?Glycerin suppositories ?Metamucil ?Milk of magnesia ?Miralax ?Senokot ?Smooth move tea ? ?Diarrhea: ?Kaopectate ?Imodium A-D ? ?*NO pepto  Bismol ? ?Hemorrhoids: ?Anusol ?Anusol HC ?Preparation H ?Tucks ? ?Indigestion: ?Tums ?Maalox ?Mylanta ?Zantac  ?Pepcid ? ?Insomnia: ?Benadryl (alcohol free) 25mg every 6 hours as needed ?Tylenol PM ?Unisom, no Gelcaps ? ?Leg Cramps: ?Tums ?MagGel ? ?Nausea/Vomiting:  ?Bonine ?Dramamine ?Emetrol ?Ginger extract ?Sea bands ?Meclizine  ?Nausea medication to take during pregnancy:  ?Unisom (doxylamine succinate 25 mg tablets) Take one tablet daily at bedtime. If symptoms are not adequately controlled, the dose can be increased to a maximum recommended dose of two tablets daily (1/2 tablet in the morning, 1/2 tablet mid-afternoon and one at bedtime). ?Vitamin B6 100mg tablets. Take one tablet twice a day (up to 200 mg per day). ? ?Skin Rashes: ?Aveeno products ?Benadryl cream or 25mg every 6 hours as needed ?Calamine Lotion ?1% cortisone cream ? ?Yeast infection: ?Gyne-lotrimin 7 ?Monistat 7 ? ? ?**If taking multiple medications, please check labels to avoid duplicating the same active ingredients ?**take medication as directed on the label ?** Do not exceed 4000 mg of tylenol in 24 hours ?**Do not take medications that contain aspirin or ibuprofen ? ? ? ? ? ? ? ? ? ?

## 2021-02-21 ENCOUNTER — Ambulatory Visit: Payer: Medicare HMO

## 2021-02-21 ENCOUNTER — Telehealth: Payer: Self-pay

## 2021-02-21 NOTE — Telephone Encounter (Signed)
Patient did not keep appt today for follow up beta HCG level. Called pt. Pt states she did not keep appt because she has decided to have the procedure. I inquired about which procedure pt is referencing. Pt states she has TAB appt scheduled for 03/10/21. I explained that pt still needs follow up to determine if this is a viable pregnancy. Explained risk of ectopic pregnancy and need for immediate follow up. Pt reports fever of 101 degrees F yesterday, does not think she should come into office. Explained pt should go immediately to MAU. Pt verbalizes understanding. States she will call her mother to come sit with children. Pt states she has transportation to MAU.

## 2021-10-06 IMAGING — US US OB COMP LESS 14 WK
1 series · 15 of 28 positions shown · non-contrast
Comparison: None.

CLINICAL DATA: 34-year-old pregnant female with left lower quadrant
abdominal pain. LMP: 01/17/2021 corresponding to an estimated
gestational age 3 weeks, 5 days.

EXAM:
OBSTETRIC <14 WK ULTRASOUND
TECHNIQUE: Transabdominal ultrasound was performed for evaluation of the
gestation as well as the maternal uterus and adnexal regions.

[Series 1: us ob comp less 14 wks mc & wl · 15 of 30 slices shown]
[im 1/30]
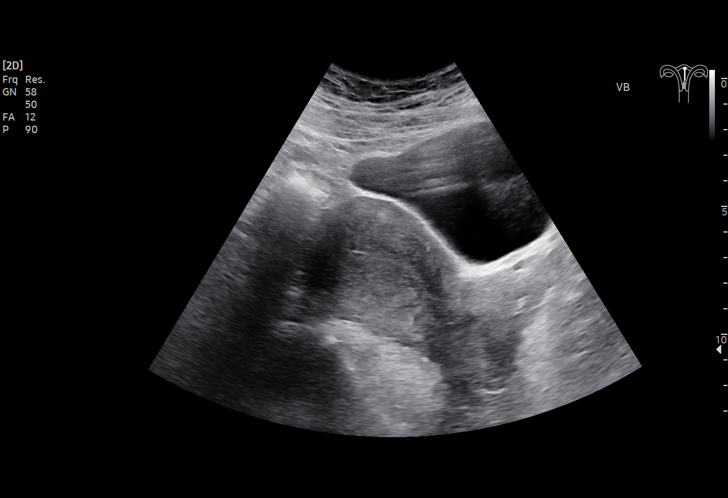
[im 3/30]
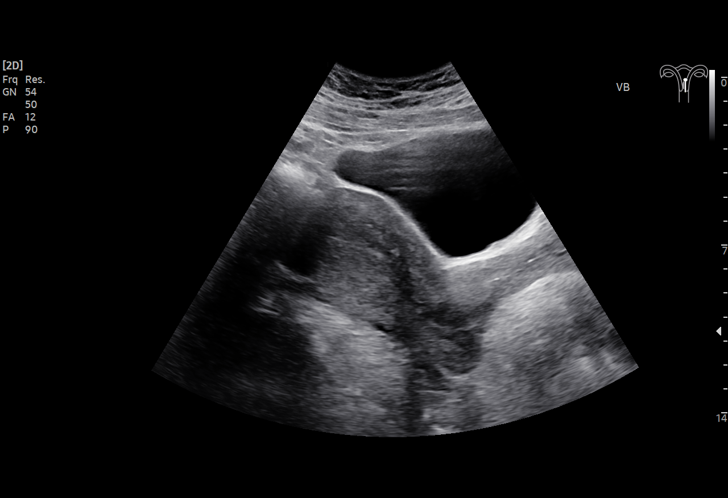
[im 5/30]
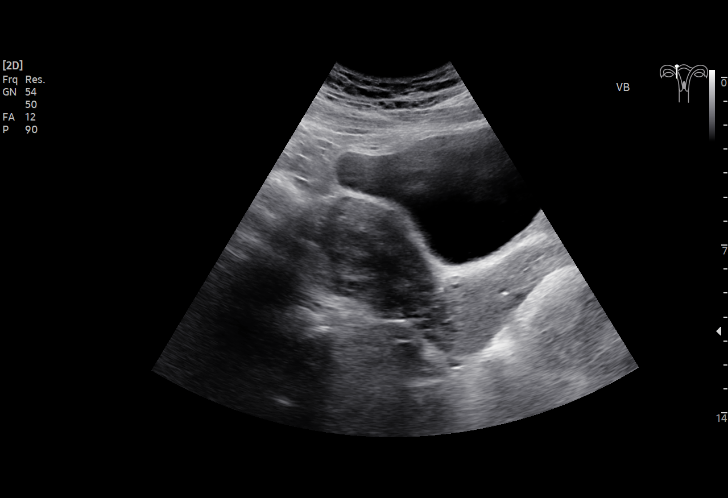
[im 7/30]
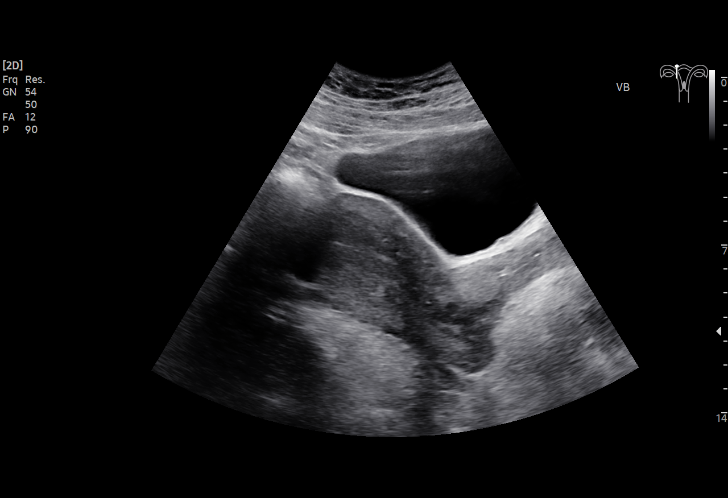
[im 9/30]
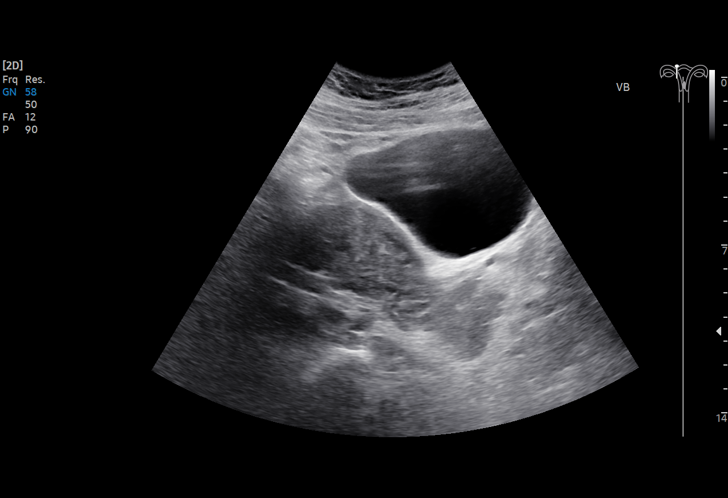
[im 11/30]
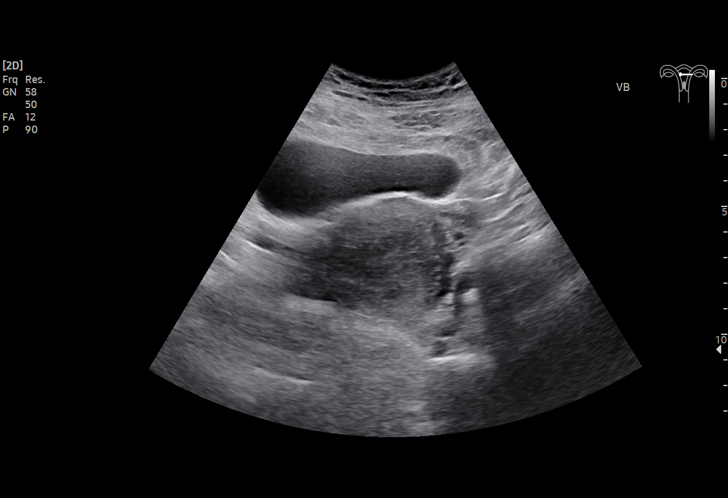
[im 13/30]
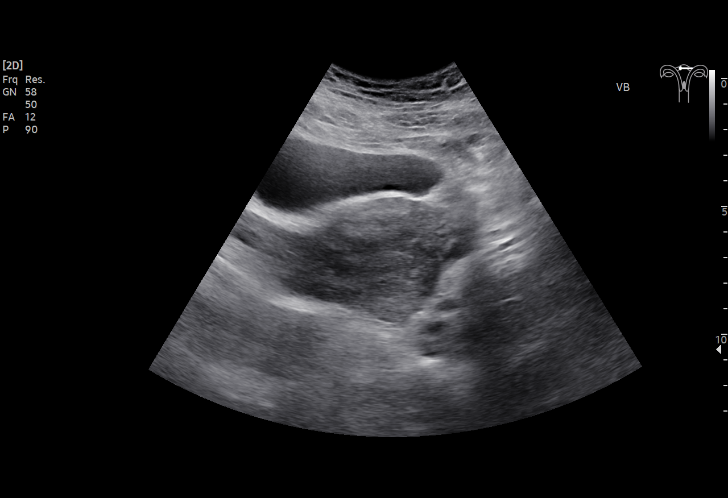
[im 16/30]
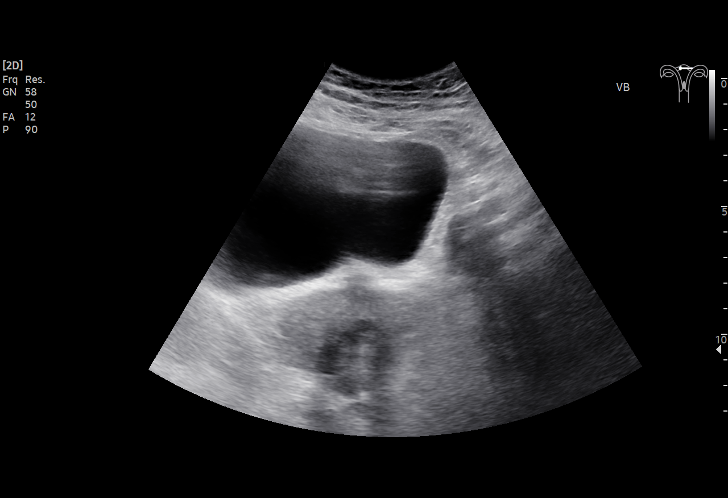
[im 17/30]
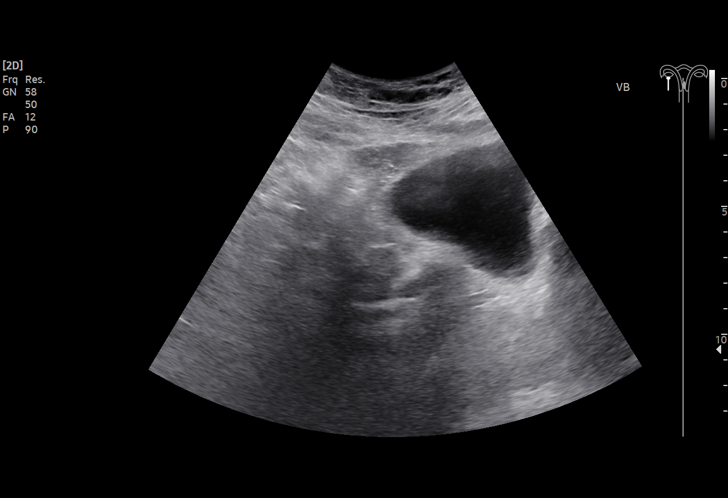
[im 19/30]
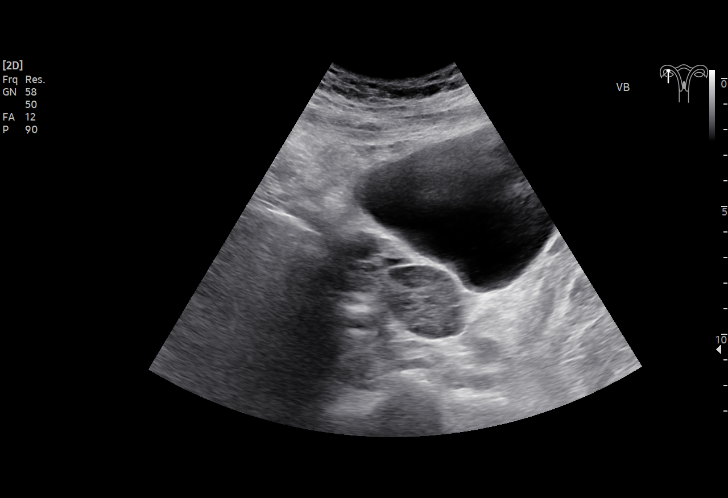
[im 21/30]
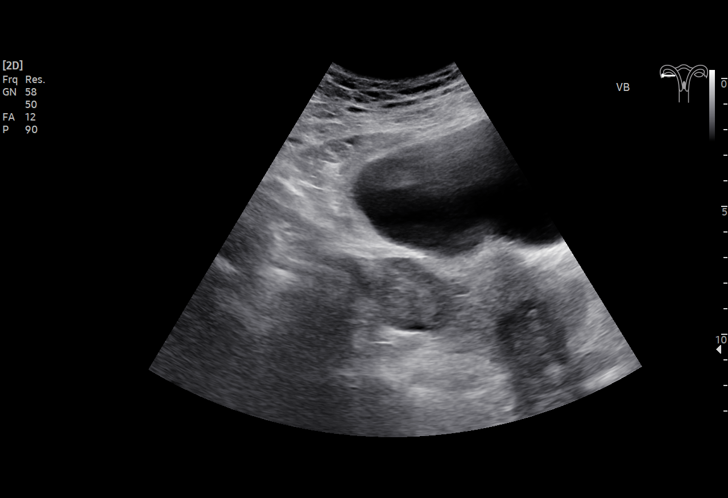
[im 23/30]
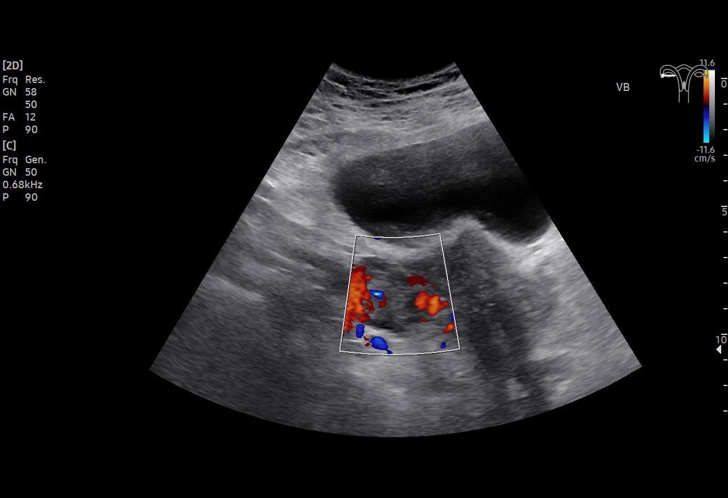
[im 25/30]
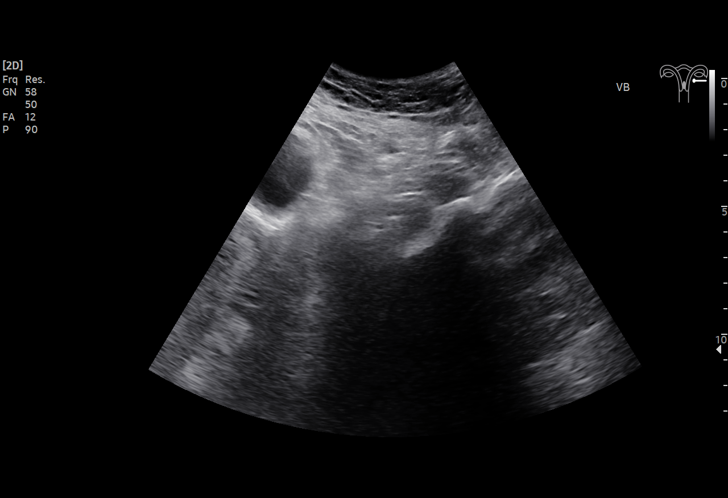
[im 27/30]
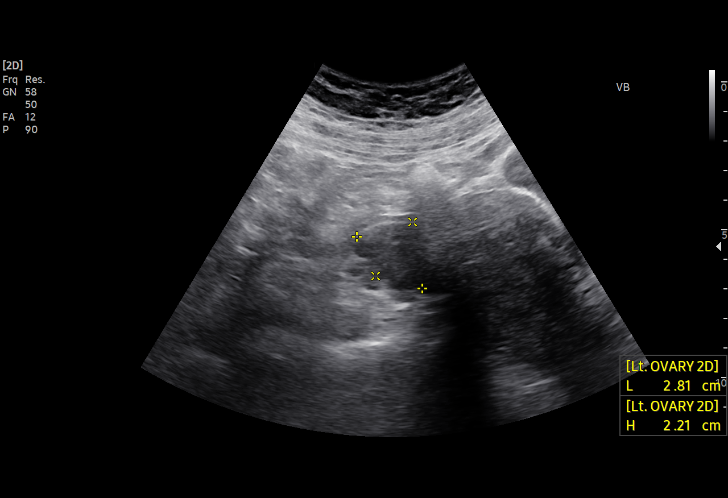
[im 30/30]
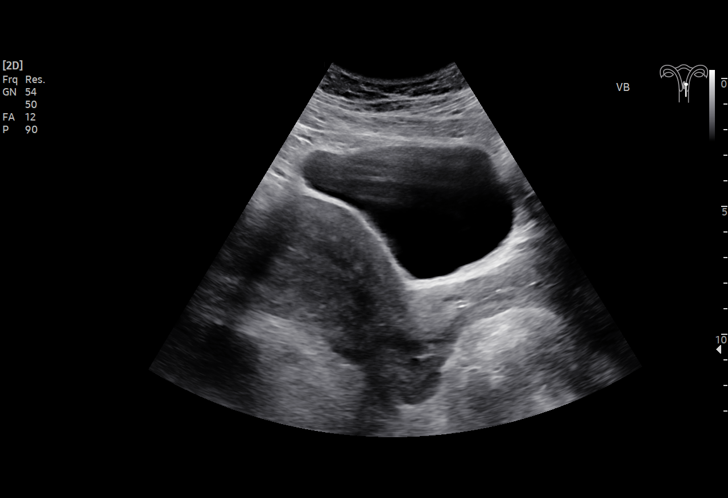

[15 of 28 positions shown; findings below may reference images not displayed]

FINDINGS: The uterus is anteverted and appears unremarkable.

The endometrium measures 3 mm in thickness and appears unremarkable.
No intrauterine pregnancy identified.

The ovaries are unremarkable. The right ovary measures 4.0 x 2.8 x
3.2 cm for a volume of 18 cc and the left ovary measures 2.8 x 2.2 x
2.4 cm for a volume of 8 cc.

No significant free fluid in the pelvis.
IMPRESSION: No intrauterine pregnancy identified and no suspicious adnexal
masses noted. Findings consistent with pregnancy of unknown location
and differential diagnosis include: An early IUP, recent spontaneous
miscarriage, or an occult ectopic pregnancy. Correlation with
clinical exam and follow-up with serial HCG levels and repeat
ultrasound as clinically indicated, recommended.

## 2021-10-06 IMAGING — US US RENAL
1 series · 15 of 25 positions shown · non-contrast
Comparison: None.

CLINICAL DATA: 34-year-old female with flank pain. History of
kidney stones.

EXAM:
RENAL / URINARY TRACT ULTRASOUND COMPLETE

[Series 1: us renal mc & wl · 15 of 31 slices shown]
[im 1/31]
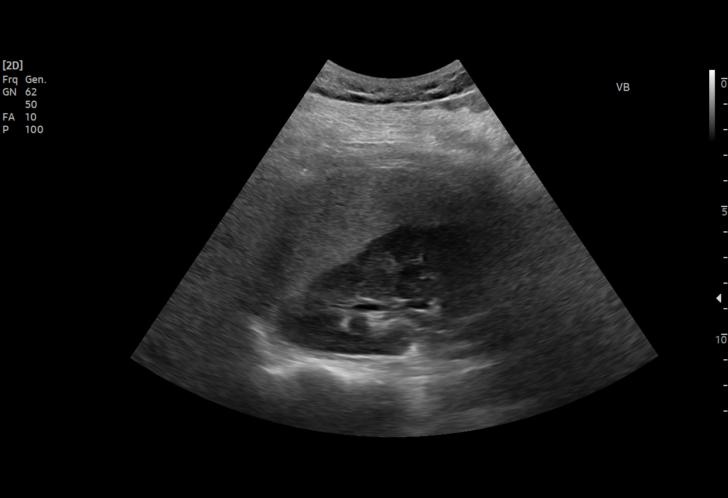
[im 3/31]
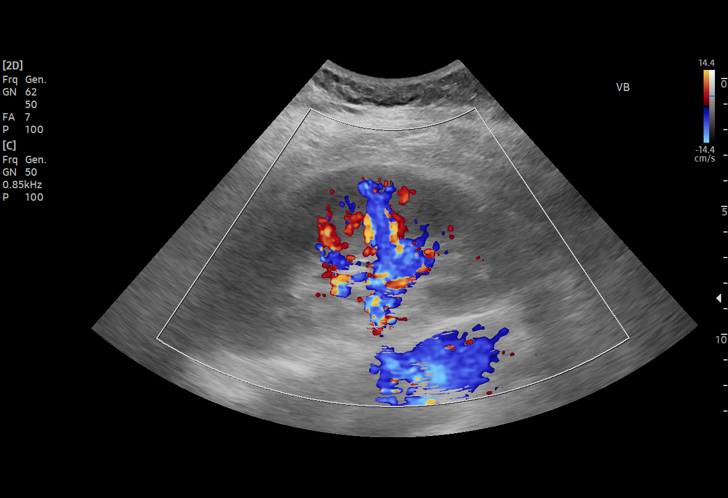
[im 6/31]
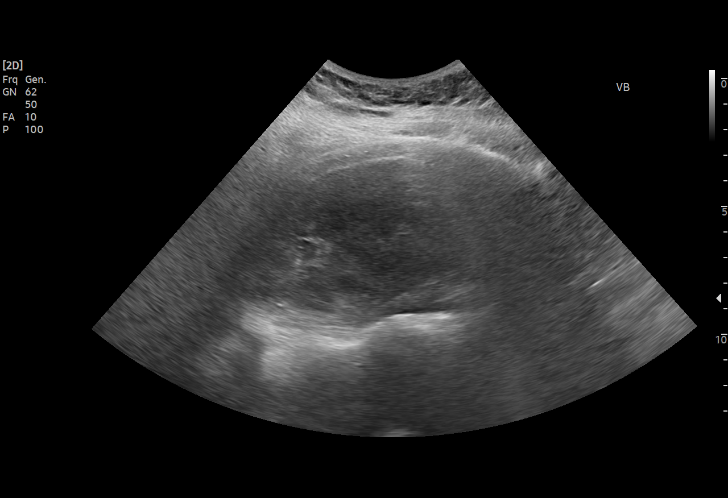
[im 7/31]
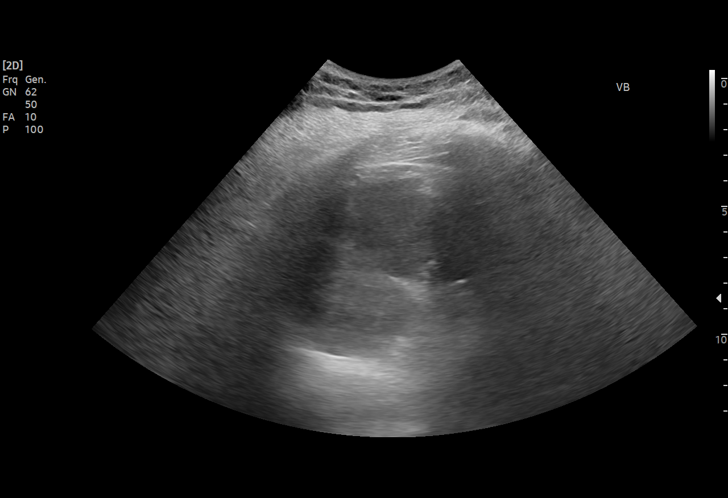
[im 9/31]
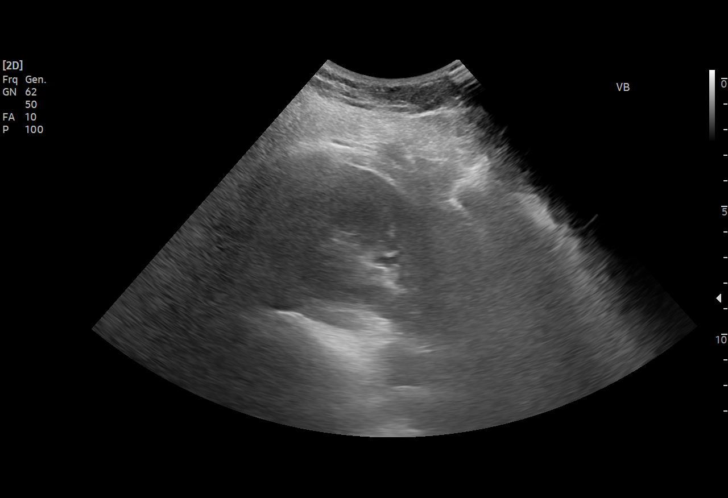
[im 12/31]
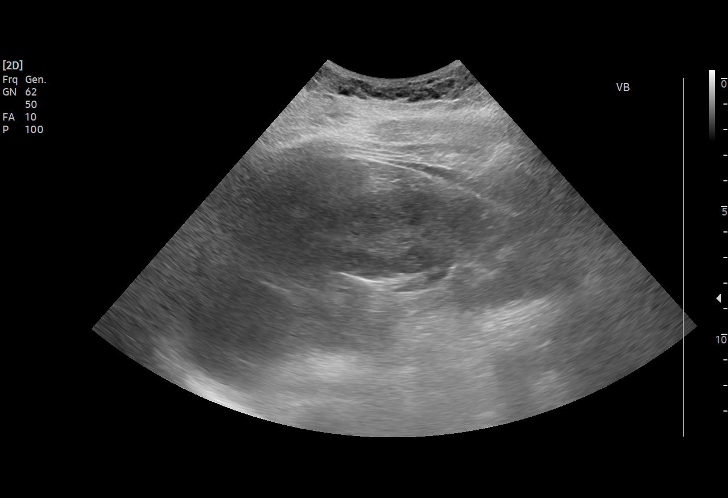
[im 13/31]
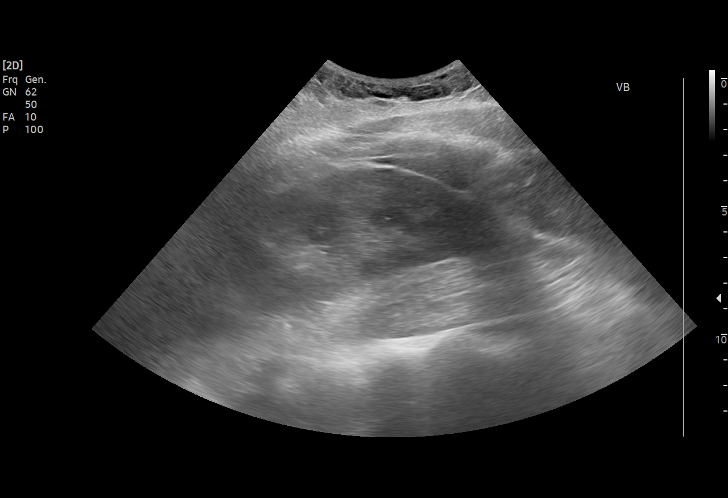
[im 16/31]
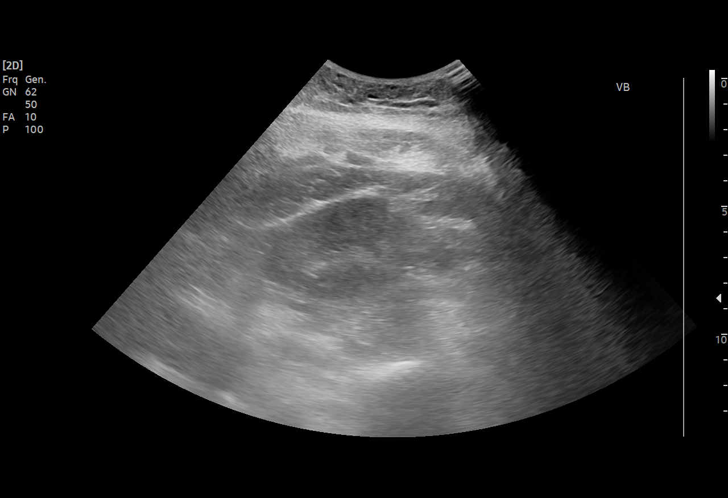
[im 18/31]
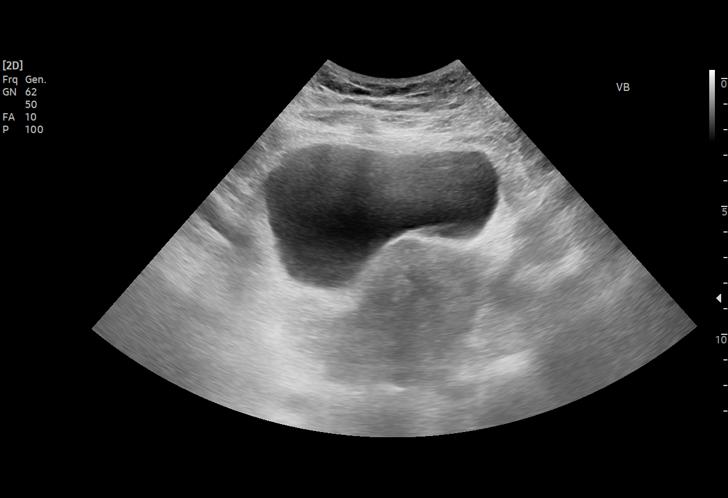
[im 19/31]
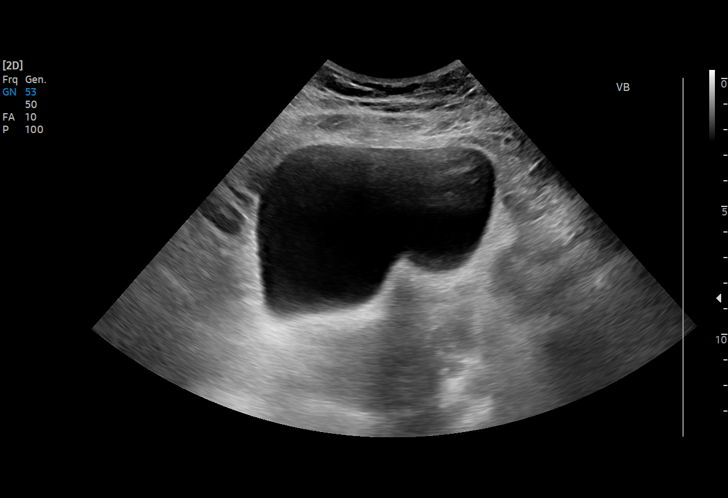
[im 22/31]
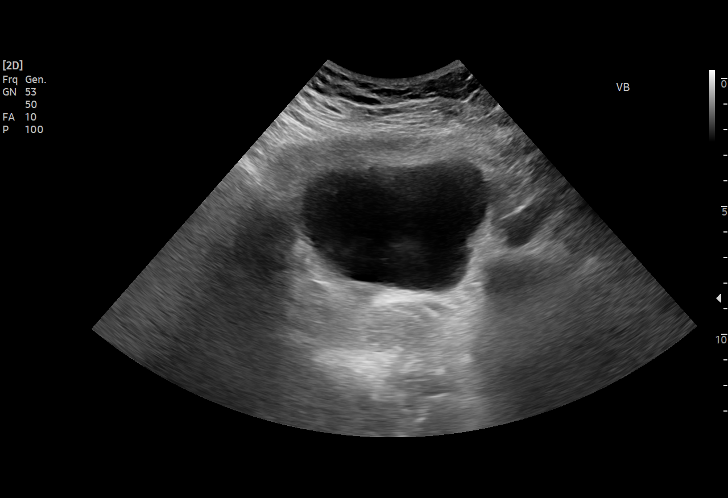
[im 24/31]
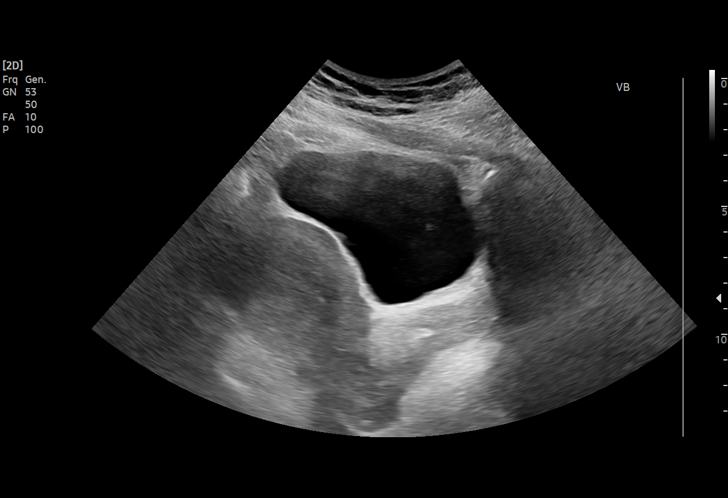
[im 26/31]
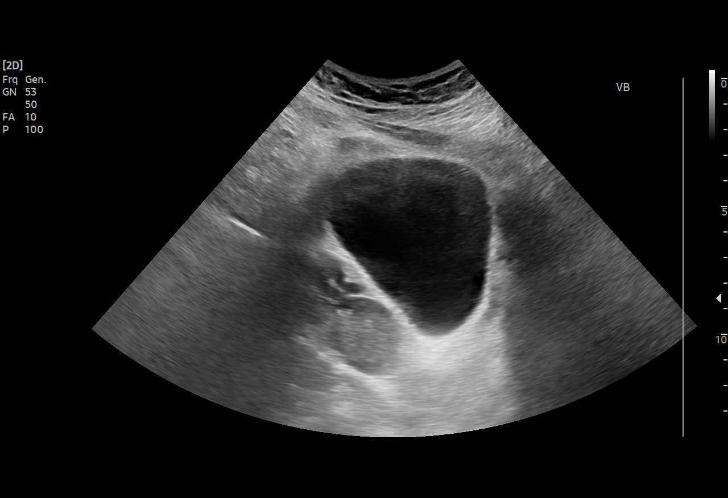
[im 28/31]
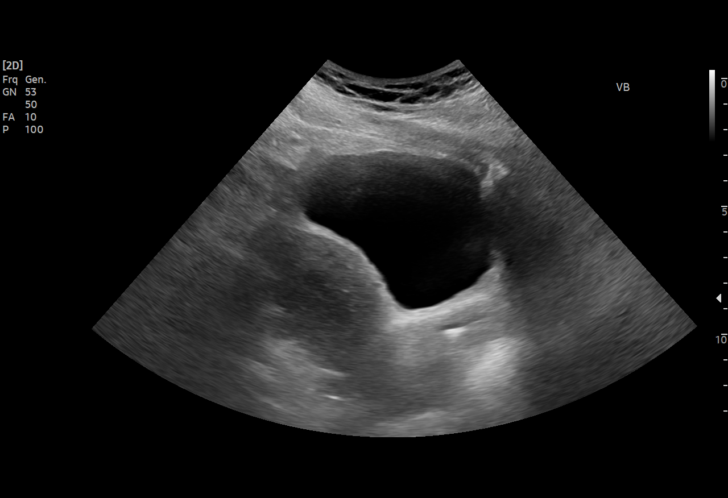
[im 31/31]
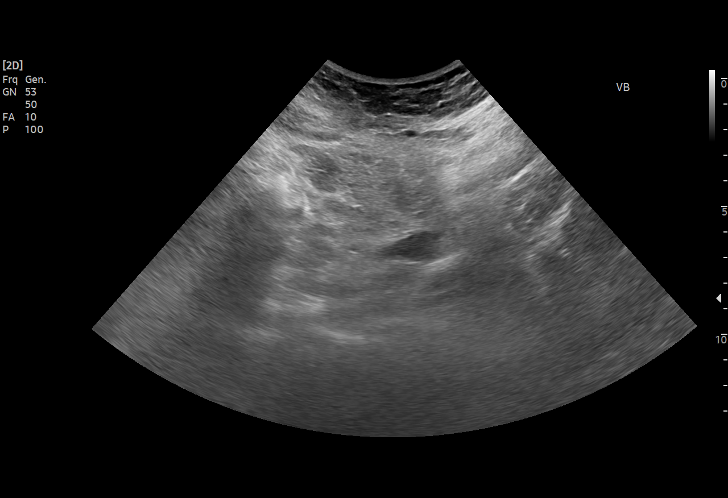

[15 of 25 positions shown; findings below may reference images not displayed]

FINDINGS: Right Kidney:

Renal measurements: 11.9 x 5.7 x 6.8 cm = volume: 243 mL.
Echogenicity within normal limits. No mass or hydronephrosis
visualized.

Left Kidney:

Renal measurements: 14.2 x 5.7 x 7.0 cm = volume: 292 mL.
Echogenicity within normal limits. No mass or hydronephrosis
visualized.

Bladder:

Appears normal for degree of bladder distention.

Other:

None.
IMPRESSION: Unremarkable renal ultrasound.

## 2022-01-12 ENCOUNTER — Encounter (HOSPITAL_COMMUNITY): Payer: Self-pay | Admitting: *Deleted

## 2022-01-12 ENCOUNTER — Inpatient Hospital Stay (HOSPITAL_COMMUNITY)
Admission: AD | Admit: 2022-01-12 | Discharge: 2022-01-13 | Disposition: A | Discharge status: Home / Self Care | Payer: Medicare HMO | Attending: Obstetrics and Gynecology | Admitting: Obstetrics and Gynecology

## 2022-01-12 ENCOUNTER — Inpatient Hospital Stay (HOSPITAL_COMMUNITY): Payer: Medicare HMO

## 2022-01-12 DIAGNOSIS — N939 Abnormal uterine and vaginal bleeding, unspecified: Secondary | ICD-10-CM

## 2022-01-12 DIAGNOSIS — Z3A01 Less than 8 weeks gestation of pregnancy: Secondary | ICD-10-CM | POA: Diagnosis not present

## 2022-01-12 DIAGNOSIS — O23591 Infection of other part of genital tract in pregnancy, first trimester: Secondary | ICD-10-CM | POA: Insufficient documentation

## 2022-01-12 DIAGNOSIS — O26891 Other specified pregnancy related conditions, first trimester: Secondary | ICD-10-CM | POA: Diagnosis not present

## 2022-01-12 DIAGNOSIS — O26851 Spotting complicating pregnancy, first trimester: Secondary | ICD-10-CM | POA: Diagnosis present

## 2022-01-12 DIAGNOSIS — O10912 Unspecified pre-existing hypertension complicating pregnancy, second trimester: Secondary | ICD-10-CM | POA: Insufficient documentation

## 2022-01-12 DIAGNOSIS — O219 Vomiting of pregnancy, unspecified: Secondary | ICD-10-CM | POA: Insufficient documentation

## 2022-01-12 DIAGNOSIS — R1030 Lower abdominal pain, unspecified: Secondary | ICD-10-CM | POA: Insufficient documentation

## 2022-01-12 DIAGNOSIS — B9689 Other specified bacterial agents as the cause of diseases classified elsewhere: Secondary | ICD-10-CM | POA: Insufficient documentation

## 2022-01-12 DIAGNOSIS — O10919 Unspecified pre-existing hypertension complicating pregnancy, unspecified trimester: Secondary | ICD-10-CM

## 2022-01-12 LAB — CBC WITH DIFFERENTIAL/PLATELET
Abs Immature Granulocytes: 0.01 10*3/uL (ref 0.00–0.07)
Basophils Absolute: 0 10*3/uL (ref 0.0–0.1)
Basophils Relative: 0 %
Eosinophils Absolute: 0.1 10*3/uL (ref 0.0–0.5)
Eosinophils Relative: 1 %
HCT: 36.5 % (ref 36.0–46.0)
Hemoglobin: 12.3 g/dL (ref 12.0–15.0)
Immature Granulocytes: 0 %
Lymphocytes Relative: 37 %
Lymphs Abs: 2.6 10*3/uL (ref 0.7–4.0)
MCH: 29 pg (ref 26.0–34.0)
MCHC: 33.7 g/dL (ref 30.0–36.0)
MCV: 86.1 fL (ref 80.0–100.0)
Monocytes Absolute: 0.8 10*3/uL (ref 0.1–1.0)
Monocytes Relative: 12 %
Neutro Abs: 3.6 10*3/uL (ref 1.7–7.7)
Neutrophils Relative %: 50 %
Platelets: 235 10*3/uL (ref 150–400)
RBC: 4.24 MIL/uL (ref 3.87–5.11)
RDW: 14 % (ref 11.5–15.5)
WBC: 7.2 10*3/uL (ref 4.0–10.5)
nRBC: 0 % (ref 0.0–0.2)

## 2022-01-12 LAB — COMPREHENSIVE METABOLIC PANEL
ALT: 14 U/L (ref 0–44)
AST: 16 U/L (ref 15–41)
Albumin: 4 g/dL (ref 3.5–5.0)
Alkaline Phosphatase: 36 U/L — ABNORMAL LOW (ref 38–126)
Anion gap: 12 (ref 5–15)
BUN: 7 mg/dL (ref 6–20)
CO2: 21 mmol/L — ABNORMAL LOW (ref 22–32)
Calcium: 9.5 mg/dL (ref 8.9–10.3)
Chloride: 102 mmol/L (ref 98–111)
Creatinine, Ser: 0.66 mg/dL (ref 0.44–1.00)
GFR, Estimated: >60 mL/min (ref >60)
Glucose, Bld: 93 mg/dL (ref 70–99)
Potassium: 4 mmol/L (ref 3.5–5.1)
Sodium: 135 mmol/L (ref 135–145)
Total Bilirubin: 0.9 mg/dL (ref 0.3–1.2)
Total Protein: 7.4 g/dL (ref 6.5–8.1)

## 2022-01-12 LAB — URINALYSIS, ROUTINE W REFLEX MICROSCOPIC
Bacteria, UA: NONE SEEN
Bilirubin Urine: NEGATIVE
Glucose, UA: NEGATIVE mg/dL
Hgb urine dipstick: NEGATIVE
Ketones, ur: NEGATIVE mg/dL
Nitrite: NEGATIVE
Protein, ur: NEGATIVE mg/dL
Specific Gravity, Urine: 1.02 (ref 1.005–1.030)
pH: 5 (ref 5.0–8.0)

## 2022-01-12 LAB — HCG, QUANTITATIVE, PREGNANCY: hCG, Beta Chain, Quant, S: 53342 m[IU]/mL — ABNORMAL HIGH (ref <5)

## 2022-01-12 LAB — WET PREP, GENITAL
Sperm: NONE SEEN
Trich, Wet Prep: NONE SEEN
WBC, Wet Prep HPF POC: <10 (ref <10)
Yeast Wet Prep HPF POC: NONE SEEN

## 2022-01-12 LAB — POCT PREGNANCY, URINE: Preg Test, Ur: POSITIVE — AB

## 2022-01-13 DIAGNOSIS — R1030 Lower abdominal pain, unspecified: Secondary | ICD-10-CM | POA: Diagnosis not present

## 2022-01-13 DIAGNOSIS — Z3A01 Less than 8 weeks gestation of pregnancy: Secondary | ICD-10-CM | POA: Diagnosis not present

## 2022-01-13 DIAGNOSIS — O23591 Infection of other part of genital tract in pregnancy, first trimester: Secondary | ICD-10-CM | POA: Diagnosis not present

## 2022-01-13 LAB — GC/CHLAMYDIA PROBE AMP (~~LOC~~) NOT AT ARMC
Chlamydia: NEGATIVE
Comment: NEGATIVE
Comment: NORMAL
Neisseria Gonorrhea: NEGATIVE

## 2022-01-13 MED ORDER — NIFEDIPINE ER 30 MG PO TB24: 30.0000 mg | ORAL_TABLET | Freq: Every day | ORAL | 3 refills | Status: DC

## 2022-01-13 MED ORDER — METRONIDAZOLE 0.75 % VA GEL: 1.0000 | Freq: Every day | VAGINAL | 0 refills | Status: DC

## 2022-01-13 MED ORDER — METRONIDAZOLE 0.75 % VA GEL
1.0000 | Freq: Every day | VAGINAL | 0 refills | Status: AC
Start: 2022-01-13 — End: 2022-01-18

## 2022-01-13 MED ORDER — PROMETHAZINE HCL 25 MG PO TABS: 25.0000 mg | ORAL_TABLET | Freq: Four times a day (QID) | ORAL | 0 refills | Status: DC | PRN

## 2022-01-13 NOTE — MAU Provider Note (Signed)
Chief Complaint:  Abdominal Pain and Vaginal Bleeding   Event Date/Time   First Provider Initiated Contact with Patient 01/12/22 2116     HPI: Brianna Booth is a 36 y.o. [id number] at [redacted]w[redacted]d who presents to maternity admissions reporting light brown spotting this morning and again while in MAU. Both times were just when wiping, the first was after an episode of vomiting that produced some dizziness and visual disturbances right after. Both faded without intervention. No nausea in MAU, also no cramping.  Pregnancy Course: Planning to see [facility] for prenatal care  Past Medical History:  Diagnosis Date   Anxiety    Asthma    Depression    Headache    Pregnancy induced hypertension    Preterm labor    OB History  Gravida Para Term Preterm AB Living  15 8 3 5 5 8   SAB IAB Ectopic Multiple Live Births  3 2   0 8    # Outcome Date GA Lbr Len/2nd Weight Sex Delivery Anes PTL Lv  15 Current           14 Preterm 07/04/18 [redacted]w[redacted]d / 00:03 6 lb 9.1 oz (2.98 kg) F Vag-Spont EPI  LIV     Birth Comments: WNL  13 Preterm 05/26/17    M Vag-Spont   LIV  12 Preterm 02/26/14    F Vag-Spont   LIV  11 Term 09/15/11    M Vag-Spont   LIV  10 Preterm 11/13/08    F Vag-Spont  Y LIV  9 Term 05/09/07 [redacted]w[redacted]d   M Vag-Spont  N LIV  8 Term 05/22/05 [redacted]w[redacted]d   F Vag-Spont  N LIV  7 Preterm 07/19/03 [redacted]w[redacted]d   F Vag-Spont  Y LIV  6 Gravida      Vag-Spont     5 IAB           4 IAB           3 SAB           2 SAB           1 SAB            Past Surgical History:  Procedure Laterality Date   DILATION AND CURETTAGE OF UTERUS     Family History  Problem Relation Age of Onset   Hypertension Father    Social History   Tobacco Use   Smoking status: Former    Types: Cigarettes    Quit date: 12/12/2017    Years since quitting: 4.0   Smokeless tobacco: Never  Vaping Use   Vaping Use: Never used  Substance Use Topics   Alcohol use: Never   Drug use: Never   No Known Allergies No medications prior to  admission.   I have reviewed patient's Past Medical Hx, Surgical Hx, Family Hx, Social Hx, medications and allergies.   ROS:  Pertinent items noted in HPI and remainder of comprehensive ROS otherwise negative.   Physical Exam  Patient Vitals for the past 24 hrs:  BP Temp Temp src Pulse Resp Height Weight  01/13/22 0128 -- -- -- -- 20 -- --  01/12/22 2002 (!) 149/95 97.8 F (36.6 C) Oral 76 18 5\' 7"  (1.702 m) 238 lb 12.8 oz (108.3 kg)   Constitutional: Well-developed, well-nourished female in no acute distress.  Cardiovascular: normal rate & rhythm Respiratory: normal effort GI: Abd soft, non-tender MS: Extremities nontender, no edema, normal ROM Neurologic: Alert and oriented x 4.  GU: no  CVA tenderness Pelvic: exam deferred   Labs: Results for orders placed or performed during the hospital encounter of 01/12/22 (from the past 24 hour(s))  Pregnancy, urine POC     Status: Abnormal   Collection Time: 01/12/22  7:12 PM  Result Value Ref Range   Preg Test, Ur POSITIVE (A) NEGATIVE  Urinalysis, Routine w reflex microscopic Urine, Clean Catch     Status: Abnormal   Collection Time: 01/12/22  8:06 PM  Result Value Ref Range   Color, Urine YELLOW YELLOW   APPearance HAZY (A) CLEAR   Specific Gravity, Urine 1.020 1.005 - 1.030   pH 5.0 5.0 - 8.0   Glucose, UA NEGATIVE NEGATIVE mg/dL   Hgb urine dipstick NEGATIVE NEGATIVE   Bilirubin Urine NEGATIVE NEGATIVE   Ketones, ur NEGATIVE NEGATIVE mg/dL   Protein, ur NEGATIVE NEGATIVE mg/dL   Nitrite NEGATIVE NEGATIVE   Leukocytes,Ua TRACE (A) NEGATIVE   RBC / HPF 0-5 0 - 5 RBC/hpf   WBC, UA 0-5 0 - 5 WBC/hpf   Bacteria, UA NONE SEEN NONE SEEN   Squamous Epithelial / LPF 6-10 0 - 5   Mucus PRESENT   CBC with Differential/Platelet     Status: None   Collection Time: 01/12/22  9:25 PM  Result Value Ref Range   WBC 7.2 4.0 - 10.5 K/uL   RBC 4.24 3.87 - 5.11 MIL/uL   Hemoglobin 12.3 12.0 - 15.0 g/dL   HCT 36.5 36.0 - 46.0 %   MCV  86.1 80.0 - 100.0 fL   MCH 29.0 26.0 - 34.0 pg   MCHC 33.7 30.0 - 36.0 g/dL   RDW 14.0 11.5 - 15.5 %   Platelets 235 150 - 400 K/uL   nRBC 0.0 0.0 - 0.2 %   Neutrophils Relative % 50 %   Neutro Abs 3.6 1.7 - 7.7 K/uL   Lymphocytes Relative 37 %   Lymphs Abs 2.6 0.7 - 4.0 K/uL   Monocytes Relative 12 %   Monocytes Absolute 0.8 0.1 - 1.0 K/uL   Eosinophils Relative 1 %   Eosinophils Absolute 0.1 0.0 - 0.5 K/uL   Basophils Relative 0 %   Basophils Absolute 0.0 0.0 - 0.1 K/uL   Immature Granulocytes 0 %   Abs Immature Granulocytes 0.01 0.00 - 0.07 K/uL  Comprehensive metabolic panel     Status: Abnormal   Collection Time: 01/12/22  9:25 PM  Result Value Ref Range   Sodium 135 135 - 145 mmol/L   Potassium 4.0 3.5 - 5.1 mmol/L   Chloride 102 98 - 111 mmol/L   CO2 21 (L) 22 - 32 mmol/L   Glucose, Bld 93 70 - 99 mg/dL   BUN 7 6 - 20 mg/dL   Creatinine, Ser 0.66 0.44 - 1.00 mg/dL   Calcium 9.5 8.9 - 10.3 mg/dL   Total Protein 7.4 6.5 - 8.1 g/dL   Albumin 4.0 3.5 - 5.0 g/dL   AST 16 15 - 41 U/L   ALT 14 0 - 44 U/L   Alkaline Phosphatase 36 (L) 38 - 126 U/L   Total Bilirubin 0.9 0.3 - 1.2 mg/dL   GFR, Estimated >60 >60 mL/min   Anion gap 12 5 - 15  hCG, quantitative, pregnancy     Status: Abnormal   Collection Time: 01/12/22  9:25 PM  Result Value Ref Range   hCG, Beta Chain, Quant, S 53,342 (H) <5 mIU/mL  Wet prep, genital     Status: Abnormal   Collection Time:  01/12/22 10:30 PM  Result Value Ref Range   Yeast Wet Prep HPF POC NONE SEEN NONE SEEN   Trich, Wet Prep NONE SEEN NONE SEEN   Clue Cells Wet Prep HPF POC PRESENT (A) NONE SEEN   WBC, Wet Prep HPF POC <10 <10   Sperm NONE SEEN    Imaging:  US OB LESS THAN 14 WEEKS WITH OB TRANSVAGINAL  Result Date: 01/12/2022 CLINICAL DATA:  Pelvic cramping, vaginal spotting.  LMP 11/26/2021. EXAM: OBSTETRIC <14 WK [place] AND TRANSVAGINAL OB US TECHNIQUE: Both transabdominal and transvaginal ultrasound examinations were performed for  complete evaluation of the gestation as well as the maternal uterus, adnexal regions, and pelvic cul-de-sac. Transvaginal technique was performed to assess early pregnancy. COMPARISON:  None Available. FINDINGS: Intrauterine gestational sac: Present, single Yolk sac:  Present, single, normal appearing Embryo:  Present, single Cardiac Activity: Present, regular Heart Rate: 140 bpm CRL:  12 mm   7 w   3 d                  [place] EDC: 08/28/2022 Subchorionic hemorrhage:  None visualized. Maternal uterus/adnexae: The uterus is anteverted. The cervix is unremarkable. No intrauterine masses are seen. There is no free fluid within the cul-de-sac. The maternal ovaries are unremarkable. IMPRESSION: Single living intrauterine gestation with an estimated gestational age of [redacted] weeks, 3 days. No acute abnormality. Electronically Signed   By: Helyn Numbers M.D.   On: 01/12/2022 22:32    MAU Course: Orders Placed This Encounter  Procedures   Wet prep, genital   US OB LESS THAN 14 WEEKS WITH OB TRANSVAGINAL   Urinalysis, Routine w reflex microscopic Urine, Clean Catch   CBC with Differential/Platelet   Comprehensive metabolic panel   hCG, quantitative, pregnancy   Pregnancy, urine POC   Discharge patient   Meds ordered this encounter  Medications   DISCONTD: promethazine (PHENERGAN) 25 MG tablet    Sig: Take 1 tablet (25 mg total) by mouth every 6 (six) hours as needed for nausea or vomiting.    Dispense:  30 tablet    Refill:  0    Order Specific Question:   Supervising Provider    Answer:   Reva Bores [[id number]]   DISCONTD: metroNIDAZOLE (METROGEL) 0.75 % vaginal gel    Sig: Place 1 Applicatorful vaginally at bedtime for 5 days.    Dispense:  50 g    Refill:  0    Order Specific Question:   Supervising Provider    Answer:   Reva Bores [[id number]]   DISCONTD: NIFEdipine (ADALAT CC) 30 MG 24 hr tablet    Sig: Take 1 tablet (30 mg total) by mouth daily.    Dispense:  30 tablet    Refill:  3    Order  Specific Question:   Supervising Provider    Answer:   Reva Bores [[id number]]   NIFEdipine (ADALAT CC) 30 MG 24 hr tablet    Sig: Take 1 tablet (30 mg total) by mouth daily.    Dispense:  30 tablet    Refill:  3    Order Specific Question:   Supervising Provider    Answer:   Reva Bores [[id number]]   metroNIDAZOLE (METROGEL) 0.75 % vaginal gel    Sig: Place 1 Applicatorful vaginally at bedtime for 5 days.    Dispense:  50 g    Refill:  0    Order Specific Question:   Supervising Provider  Answer:   Donnamae Jude [[id number]]   promethazine (PHENERGAN) 25 MG tablet    Sig: Take 1 tablet (25 mg total) by mouth every 6 (six) hours as needed for nausea or vomiting.    Dispense:  30 tablet    Refill:  0    Order Specific Question:   Supervising Provider    Answer:   Merrily Pew   MDM: Ectopic workup ordered from triage, self-swabs obtained. All results normal except BV on wet prep. Explained how BV can cause cerv/vaginitis that can cause the kind of brown discharge she is seeing. Metrogel sent to pharmacy as well as nausea medication.  Hypertensive in MAU, has a prescription for nifedipine, encouraged her to keep taking until her first OB appointment. Pt amenable to plan.  Assessment: 1. Lower abdominal pain   2. Vaginal spotting   3. Bacterial vaginosis   4. Nausea/vomiting in pregnancy   5. Chronic hypertension during pregnancy   6. [redacted] weeks gestation of pregnancy    Plan: Discharge home in stable condition with first trimester precautions    Follow-up Information     [facility] Follow up.   Why: as scheduled for ongoing prenatal care Contact information: [address] [place] [address] [phone]                 Allergies as of 01/13/2022   No Known Allergies      Medication List     STOP taking these medications    clonazePAM 1 MG tablet Commonly known as: KLONOPIN   cloNIDine 0.1 MG tablet Commonly known as: CATAPRES   ondansetron  4 MG disintegrating tablet Commonly known as: Zofran ODT       TAKE these medications    acetaminophen 500 MG tablet Commonly known as: TYLENOL Take 1,000 mg by mouth every 6 (six) hours as needed for moderate pain.   albuterol 108 (90 Base) MCG/ACT inhaler Commonly known as: VENTOLIN HFA Inhale 1-2 puffs into the lungs every 6 (six) hours as needed for wheezing or shortness of breath.   ARIPiprazole 20 MG tablet Commonly known as: ABILIFY Take 20 mg by mouth at bedtime.   busPIRone 15 MG tablet Commonly known as: BUSPAR Take 15 mg by mouth 2 (two) times daily.   hydrOXYzine 50 MG capsule Commonly known as: VISTARIL Take 50 mg by mouth 4 (four) times daily as needed for anxiety.   lamoTRIgine 150 MG tablet Commonly known as: LAMICTAL Take 150 mg by mouth at bedtime.   metroNIDAZOLE 0.75 % vaginal gel Commonly known as: METROGEL Place 1 Applicatorful vaginally at bedtime for 5 days.   montelukast 10 MG tablet Commonly known as: SINGULAIR Take 10 mg by mouth daily.   NIFEdipine 30 MG 24 hr tablet Commonly known as: ADALAT CC Take 1 tablet (30 mg total) by mouth daily.   Prenatal Vitamins 0.8 MG tablet Take 1 tablet by mouth daily.   promethazine 25 MG tablet Commonly known as: PHENERGAN Take 1 tablet (25 mg total) by mouth every 6 (six) hours as needed for nausea or vomiting.        Gaylan Gerold, CNM, MSN, [place] Certified Nurse Midwife, [facility]

## 2022-01-19 NOTE — Progress Notes (Signed)
Quantitative HCG ordered for first trimester bleeding workup due to the vaginal spotting reported by the patient.  Edd Arbour, CNM, MSN, IBCLC Certified Nurse Midwife, [facility]

## 2022-04-11 ENCOUNTER — Inpatient Hospital Stay (HOSPITAL_COMMUNITY): Payer: Medicare HMO

## 2022-04-11 ENCOUNTER — Encounter (HOSPITAL_COMMUNITY): Payer: Self-pay | Admitting: Obstetrics and Gynecology

## 2022-04-11 ENCOUNTER — Inpatient Hospital Stay (HOSPITAL_COMMUNITY)
Admission: AD | Admit: 2022-04-11 | Discharge: 2022-04-11 | Disposition: A | Discharge status: Home / Self Care | Payer: Medicare HMO | Attending: Obstetrics and Gynecology | Admitting: Obstetrics and Gynecology

## 2022-04-11 DIAGNOSIS — O99512 Diseases of the respiratory system complicating pregnancy, second trimester: Secondary | ICD-10-CM | POA: Insufficient documentation

## 2022-04-11 DIAGNOSIS — M542 Cervicalgia: Secondary | ICD-10-CM | POA: Diagnosis not present

## 2022-04-11 DIAGNOSIS — R079 Chest pain, unspecified: Secondary | ICD-10-CM | POA: Diagnosis not present

## 2022-04-11 DIAGNOSIS — Z3A19 19 weeks gestation of pregnancy: Secondary | ICD-10-CM | POA: Insufficient documentation

## 2022-04-11 DIAGNOSIS — Z20822 Contact with and (suspected) exposure to covid-19: Secondary | ICD-10-CM | POA: Insufficient documentation

## 2022-04-11 DIAGNOSIS — J988 Other specified respiratory disorders: Secondary | ICD-10-CM

## 2022-04-11 DIAGNOSIS — O26892 Other specified pregnancy related conditions, second trimester: Secondary | ICD-10-CM | POA: Diagnosis present

## 2022-04-11 DIAGNOSIS — J45909 Unspecified asthma, uncomplicated: Secondary | ICD-10-CM | POA: Diagnosis not present

## 2022-04-11 DIAGNOSIS — J0100 Acute maxillary sinusitis, unspecified: Secondary | ICD-10-CM | POA: Insufficient documentation

## 2022-04-11 LAB — COMPREHENSIVE METABOLIC PANEL
ALT: 23 U/L (ref 0–44)
AST: 18 U/L (ref 15–41)
Albumin: 3 g/dL — ABNORMAL LOW (ref 3.5–5.0)
Alkaline Phosphatase: 46 U/L (ref 38–126)
Anion gap: 8 (ref 5–15)
BUN: 5 mg/dL — ABNORMAL LOW (ref 6–20)
CO2: 21 mmol/L — ABNORMAL LOW (ref 22–32)
Calcium: 9.3 mg/dL (ref 8.9–10.3)
Chloride: 106 mmol/L (ref 98–111)
Creatinine, Ser: 0.46 mg/dL (ref 0.44–1.00)
GFR, Estimated: >60 mL/min (ref >60)
Glucose, Bld: 76 mg/dL (ref 70–99)
Potassium: 3.8 mmol/L (ref 3.5–5.1)
Sodium: 135 mmol/L (ref 135–145)
Total Bilirubin: 0.5 mg/dL (ref 0.3–1.2)
Total Protein: 6.8 g/dL (ref 6.5–8.1)

## 2022-04-11 LAB — CBC
HCT: 36.8 % (ref 36.0–46.0)
Hemoglobin: 12.4 g/dL (ref 12.0–15.0)
MCH: 29.5 pg (ref 26.0–34.0)
MCHC: 33.7 g/dL (ref 30.0–36.0)
MCV: 87.6 fL (ref 80.0–100.0)
Platelets: 168 10*3/uL (ref 150–400)
RBC: 4.2 MIL/uL (ref 3.87–5.11)
RDW: 14.3 % (ref 11.5–15.5)
WBC: 9.8 10*3/uL (ref 4.0–10.5)
nRBC: 0 % (ref 0.0–0.2)

## 2022-04-11 LAB — TROPONIN I (HIGH SENSITIVITY): Troponin I (High Sensitivity): 7 ng/L (ref <18)

## 2022-04-11 LAB — SARS CORONAVIRUS 2 BY RT PCR: SARS Coronavirus 2 by RT PCR: NEGATIVE

## 2022-04-11 MED ORDER — ACETAMINOPHEN 500 MG PO TABS
1000.0000 mg | ORAL_TABLET | Freq: Once | ORAL | Status: AC
  Administered 2022-04-11: 1000 mg via ORAL
  Filled 2022-04-11: qty 2

## 2022-04-11 MED ORDER — ALBUTEROL SULFATE (2.5 MG/3ML) 0.083% IN NEBU
3.0000 mL | INHALATION_SOLUTION | Freq: Once | RESPIRATORY_TRACT | Status: DC
Start: 2022-04-11 — End: 2022-04-11
  Filled 2022-04-11: qty 3

## 2022-04-11 MED ORDER — CYCLOBENZAPRINE HCL 10 MG PO TABS: 10.0000 mg | ORAL_TABLET | Freq: Three times a day (TID) | ORAL | 0 refills | Status: DC | PRN

## 2022-04-11 MED ORDER — PROMETHAZINE HCL 25 MG PO TABS: 25.0000 mg | ORAL_TABLET | Freq: Four times a day (QID) | ORAL | 0 refills | Status: DC | PRN

## 2022-04-11 MED ORDER — BENZONATATE 100 MG PO CAPS: 100.0000 mg | ORAL_CAPSULE | Freq: Four times a day (QID) | ORAL | 0 refills | Status: DC | PRN

## 2022-04-11 MED ORDER — SODIUM CHLORIDE 0.9 % IV SOLN
25.0000 mg | Freq: Once | INTRAVENOUS | Status: AC
  Administered 2022-04-11: 25 mg via INTRAVENOUS
  Filled 2022-04-11: qty 1

## 2022-04-11 MED ORDER — LACTATED RINGERS IV BOLUS
1000.0000 mL | Freq: Once | INTRAVENOUS | Status: AC
  Administered 2022-04-11: 1000 mL via INTRAVENOUS

## 2022-04-11 MED ORDER — CYCLOBENZAPRINE HCL 5 MG PO TABS
10.0000 mg | ORAL_TABLET | Freq: Once | ORAL | Status: AC
  Administered 2022-04-11: 10 mg via ORAL
  Filled 2022-04-11: qty 2

## 2022-04-11 MED ORDER — BENZONATATE 100 MG PO CAPS
200.0000 mg | ORAL_CAPSULE | Freq: Once | ORAL | Status: AC
  Administered 2022-04-11: 200 mg via ORAL
  Filled 2022-04-11: qty 2

## 2022-04-11 MED ORDER — ALBUTEROL SULFATE (2.5 MG/3ML) 0.083% IN NEBU
2.5000 mg | INHALATION_SOLUTION | Freq: Once | RESPIRATORY_TRACT | Status: AC
Start: 2022-04-11 — End: 2022-04-11
  Administered 2022-04-11: 2.5 mg via RESPIRATORY_TRACT

## 2022-04-11 MED ORDER — ONDANSETRON HCL 4 MG/2ML IJ SOLN
4.0000 mg | Freq: Once | INTRAMUSCULAR | Status: DC
  Filled 2022-04-11: qty 2

## 2022-04-11 MED ORDER — ALBUTEROL SULFATE HFA 108 (90 BASE) MCG/ACT IN AERS: 2.0000 | INHALATION_SPRAY | Freq: Once | RESPIRATORY_TRACT | Status: DC

## 2022-04-11 NOTE — MAU Note (Signed)
Brianna Booth is a 36 y.o. at [redacted]w[redacted]d here in MAU reporting: can't move her neck, it hurt so bad, face is swollen on rt side. Chest hurts, can't hardly breath.  Is coughing up mucous.  Believes she has had a fever, has been hot, hasn't actually checked it. Sore throat, hard to swallow.  Rt side and back feels like she got beat up or something.  No OB complaints  Onset of complaint:  9/17 Pain score: 8-10 Vitals:   04/11/22 1411  BP: (!) 133/91  Pulse: 83  Resp: 20  Temp: 98.7 F (37.1 C)  SpO2: 100%     FHT:156 Lab orders placed from triage:

## 2022-04-11 NOTE — Discharge Instructions (Signed)

## 2022-04-11 NOTE — MAU Provider Note (Cosign Needed Addendum)
History     [id number]  Arrival date and time: 04/11/22 1341    Chief Complaint  Patient presents with   Cough     HPI Brianna Booth is a 36 y.o. at [redacted]w[redacted]d who presents for cough, shortness of breath, congestion, headache, and neck pain. Symptoms started on Monday. [facility] & tested negative for covid at the time. Symptoms include productive cough, headache, right neck & shoulder pain, chest pain, & sinus pain.  Chest pain occurs with cough. Cough productive of yellow/green mucous. Used albuterol inhaler a few days ago. Has been taking robitussin without relief. Hasn't taken any meds today. Feels like she's had a fever but hasn't checked her temperature. Has had nausea & vomiting.  Denies abdominal pain, vaginal bleeding, or LOF.  OB History     Gravida  14   Para  8   Term  3   Preterm  5   AB  5   Living  8      SAB  3   IAB  2   Ectopic      Multiple  0   Live Births  8           Past Medical History:  Diagnosis Date   Anxiety    Asthma    Depression    Essential hypertension 02/22/2019   Headache    Preterm labor     Past Surgical History:  Procedure Laterality Date   DILATION AND CURETTAGE OF UTERUS      Family History  Problem Relation Age of Onset   Hypertension Father     No Known Allergies  No current facility-administered medications on file prior to encounter.   Current Outpatient Medications on File Prior to Encounter  Medication Sig Dispense Refill   Prenatal Multivit-Min-Fe-FA (PRENATAL VITAMINS) 0.8 MG tablet Take 1 tablet by mouth daily. 30 tablet 12   promethazine (PHENERGAN) 25 MG tablet Take 1 tablet (25 mg total) by mouth every 6 (six) hours as needed for nausea or vomiting. 30 tablet 0   acetaminophen (TYLENOL) 500 MG tablet Take 1,000 mg by mouth every 6 (six) hours as needed for moderate pain.     albuterol (PROVENTIL HFA;VENTOLIN HFA) 108 (90 Base) MCG/ACT inhaler Inhale 1-2 puffs into the  lungs every 6 (six) hours as needed for wheezing or shortness of breath.     clonazePAM (KLONOPIN) 1 MG tablet Take 1 mg by mouth 3 (three) times daily. (Patient not taking: Reported on 04/11/2022)     Doxylamine-Pyridoxine 10-10 MG TBEC Take by mouth.     hydrOXYzine (ATARAX) 25 MG tablet Take 25 mg by mouth daily as needed. (Patient not taking: Reported on 04/11/2022)     montelukast (SINGULAIR) 10 MG tablet Take 10 mg by mouth daily. (Patient not taking: Reported on 04/11/2022)       ROS Pertinent positives and negative per HPI, all others reviewed and negative  Physical Exam   BP (!) 133/91 (BP Location: Left Arm)   Pulse 83   Temp 98.7 F (37.1 C) (Oral)   Resp 20   LMP 11/26/2021   SpO2 100%   Patient Vitals for the past 24 hrs:  BP Temp Temp src Pulse Resp SpO2  04/11/22 1411 (!) 133/91 98.7 F (37.1 C) Oral 83 20 100 %    Physical Exam Vitals and nursing note reviewed.  Constitutional:      General: She is not in acute distress.  Appearance: She is ill-appearing. She is not toxic-appearing.  HENT:     Head: Normocephalic and atraumatic.     Nose:     Right Sinus: Maxillary sinus tenderness present. No frontal sinus tenderness.     Left Sinus: Maxillary sinus tenderness present. No frontal sinus tenderness.  Eyes:     Conjunctiva/sclera: Conjunctivae normal.  Cardiovascular:     Rate and Rhythm: Normal rate and regular rhythm.  Pulmonary:     Effort: Pulmonary effort is normal.     Breath sounds: Examination of the right-middle field reveals wheezing. Examination of the right-lower field reveals wheezing. Examination of the left-lower field reveals wheezing. Wheezing present.  Musculoskeletal:     Cervical back: No rigidity or crepitus. Muscular tenderness present. Normal range of motion.  Skin:    General: Skin is warm and dry.       Labs Results for orders placed or performed during the hospital encounter of 04/11/22 (from the past 24 hour(s))  SARS  Coronavirus 2 by RT PCR (hospital order, performed in [facility] hospital lab) *cepheid single result test* Anterior Nasal Swab     Status: None   Collection Time: 04/11/22  2:16 PM   Specimen: Anterior Nasal Swab  Result Value Ref Range   SARS Coronavirus 2 by RT PCR NEGATIVE NEGATIVE  CBC     Status: None   Collection Time: 04/11/22  3:10 PM  Result Value Ref Range   WBC 9.8 4.0 - 10.5 K/uL   RBC 4.20 3.87 - 5.11 MIL/uL   Hemoglobin 12.4 12.0 - 15.0 g/dL   HCT 99.3 71.6 - 96.7 %   MCV 87.6 80.0 - 100.0 fL   MCH 29.5 26.0 - 34.0 pg   MCHC 33.7 30.0 - 36.0 g/dL   RDW 89.3 81.0 - 17.5 %   Platelets 168 150 - 400 K/uL   nRBC 0.0 0.0 - 0.2 %  Comprehensive metabolic panel     Status: Abnormal   Collection Time: 04/11/22  3:10 PM  Result Value Ref Range   Sodium 135 135 - 145 mmol/L   Potassium 3.8 3.5 - 5.1 mmol/L   Chloride 106 98 - 111 mmol/L   CO2 21 (L) 22 - 32 mmol/L   Glucose, Bld 76 70 - 99 mg/dL   BUN 5 (L) 6 - 20 mg/dL   Creatinine, Ser 1.02 0.44 - 1.00 mg/dL   Calcium 9.3 8.9 - 58.5 mg/dL   Total Protein 6.8 6.5 - 8.1 g/dL   Albumin 3.0 (L) 3.5 - 5.0 g/dL   AST 18 15 - 41 U/L   ALT 23 0 - 44 U/L   Alkaline Phosphatase 46 38 - 126 U/L   Total Bilirubin 0.5 0.3 - 1.2 mg/dL   GFR, Estimated >27 >78 mL/min   Anion gap 8 5 - 15  Troponin I (High Sensitivity)     Status: None   Collection Time: 04/11/22  3:10 PM  Result Value Ref Range   Troponin I (High Sensitivity) 7 <18 ng/L    Imaging DG CHEST PORT 1 VIEW  Result Date: 04/11/2022 CLINICAL DATA:  Cough, shortness of breath EXAM: PORTABLE CHEST 1 VIEW COMPARISON:  None Available. FINDINGS: The heart size and mediastinal contours are within normal limits. Both lungs are clear. The visualized skeletal structures are unremarkable. IMPRESSION: No active disease. Electronically Signed   By: Duanne Guess D.O.   On: 04/11/2022 15:30    MAU Course  Procedures Lab Orders  SARS Coronavirus 2 by RT PCR (hospital  order, performed in [facility] hospital lab) *cepheid single result test* Anterior Nasal Swab         CBC         Comprehensive metabolic panel     Meds ordered this encounter  Medications   lactated ringers bolus 1,000 mL   DISCONTD: ondansetron (ZOFRAN) injection 4 mg   DISCONTD: albuterol (PROVENTIL) (2.5 MG/3ML) 0.083% nebulizer solution 3 mL   benzonatate (TESSALON) capsule 200 mg   acetaminophen (TYLENOL) tablet 1,000 mg   cyclobenzaprine (FLEXERIL) tablet 10 mg   DISCONTD: albuterol (VENTOLIN HFA) 108 (90 Base) MCG/ACT inhaler 2 puff   albuterol (PROVENTIL) (2.5 MG/3ML) 0.083% nebulizer solution 2.5 mg   promethazine (PHENERGAN) 25 mg in sodium chloride 0.9 % 50 mL IVPB   promethazine (PHENERGAN) 25 MG tablet    Sig: Take 1 tablet (25 mg total) by mouth every 6 (six) hours as needed for nausea or vomiting.    Dispense:  30 tablet    Refill:  0    Order Specific Question:   Supervising Provider    Answer:   Duane Lope H [[id number]]   cyclobenzaprine (FLEXERIL) 10 MG tablet    Sig: Take 1 tablet (10 mg total) by mouth 3 (three) times daily as needed for muscle spasms.    Dispense:  15 tablet    Refill:  0    Order Specific Question:   Supervising Provider    Answer:   Despina Hidden, LUTHER H [[id number]]   benzonatate (TESSALON PERLES) 100 MG capsule    Sig: Take 1 capsule (100 mg total) by mouth every 6 (six) hours as needed for up to 20 doses for cough.    Dispense:  20 capsule    Refill:  0    Order Specific Question:   Supervising Provider    Answer:   Lazaro Arms [[id number]]   Imaging Orders         DG CHEST PORT 1 VIEW     MDM FHT present via doppler  [facility] in [facility] everywhere. Covid negative. Prescribed albuterol inhaler Reviewed prenatal record scanned under media  Covid negative today. Labs normal. Chest xray negative. EKG normal sinus rhythm.   Patient given albuterol nebulizer, iv fluids, phenergan, tylenol, tessalon, & flexeril. Improvement in  symptoms. Vital signs stable. Reviewed symptomatic treatment at home & reasons to return   Eval consistent with URI, may have sinusitis as well - abx not indicated at this time. Neck pain started more recently - likely MSK due to coughing & vomiting. No neck rigidity or leukocytosis.  Assessment and Plan   1. Respiratory infection  -Rx tessalon, flexeril, & phenergan -Given list of OTC meds safe in pregnancy - discussed symptomatic treatment -Reviewed reasons to return to MAU  2. Acute non-recurrent maxillary sinusitis   3. [redacted] weeks gestation of pregnancy      Judeth Horn, NP 04/11/22 6:53 PM

## 2022-04-15 ENCOUNTER — Other Ambulatory Visit: Payer: Self-pay

## 2022-04-15 ENCOUNTER — Inpatient Hospital Stay (HOSPITAL_COMMUNITY)
Admission: AD | Admit: 2022-04-15 | Discharge: 2022-04-16 | Disposition: A | Discharge status: Home / Self Care | Payer: Medicare HMO | Attending: Obstetrics and Gynecology | Admitting: Obstetrics and Gynecology

## 2022-04-15 DIAGNOSIS — J4520 Mild intermittent asthma, uncomplicated: Secondary | ICD-10-CM | POA: Diagnosis not present

## 2022-04-15 DIAGNOSIS — R000 Tachycardia, unspecified: Secondary | ICD-10-CM | POA: Diagnosis not present

## 2022-04-15 DIAGNOSIS — M549 Dorsalgia, unspecified: Secondary | ICD-10-CM | POA: Diagnosis not present

## 2022-04-15 DIAGNOSIS — O26892 Other specified pregnancy related conditions, second trimester: Secondary | ICD-10-CM | POA: Diagnosis not present

## 2022-04-15 DIAGNOSIS — R0789 Other chest pain: Secondary | ICD-10-CM

## 2022-04-15 DIAGNOSIS — O99891 Other specified diseases and conditions complicating pregnancy: Secondary | ICD-10-CM | POA: Diagnosis not present

## 2022-04-15 DIAGNOSIS — O10912 Unspecified pre-existing hypertension complicating pregnancy, second trimester: Secondary | ICD-10-CM | POA: Insufficient documentation

## 2022-04-15 DIAGNOSIS — R21 Rash and other nonspecific skin eruption: Secondary | ICD-10-CM | POA: Insufficient documentation

## 2022-04-15 DIAGNOSIS — O99512 Diseases of the respiratory system complicating pregnancy, second trimester: Secondary | ICD-10-CM | POA: Diagnosis not present

## 2022-04-15 DIAGNOSIS — J45909 Unspecified asthma, uncomplicated: Secondary | ICD-10-CM | POA: Diagnosis not present

## 2022-04-15 DIAGNOSIS — R109 Unspecified abdominal pain: Secondary | ICD-10-CM | POA: Insufficient documentation

## 2022-04-15 DIAGNOSIS — O0942 Supervision of pregnancy with grand multiparity, second trimester: Secondary | ICD-10-CM | POA: Diagnosis not present

## 2022-04-15 DIAGNOSIS — R079 Chest pain, unspecified: Secondary | ICD-10-CM | POA: Diagnosis not present

## 2022-04-15 DIAGNOSIS — Z3A20 20 weeks gestation of pregnancy: Secondary | ICD-10-CM | POA: Diagnosis not present

## 2022-04-15 DIAGNOSIS — R072 Precordial pain: Secondary | ICD-10-CM | POA: Diagnosis not present

## 2022-04-15 DIAGNOSIS — J069 Acute upper respiratory infection, unspecified: Secondary | ICD-10-CM | POA: Insufficient documentation

## 2022-04-15 DIAGNOSIS — R899 Unspecified abnormal finding in specimens from other organs, systems and tissues: Secondary | ICD-10-CM | POA: Diagnosis not present

## 2022-04-15 MED ORDER — SODIUM CHLORIDE 0.9 % IV SOLN: Freq: Once | INTRAVENOUS | Status: DC

## 2022-04-15 NOTE — MAU Note (Signed)
..  Brianna Booth is a 36 y.o. at [redacted]w[redacted]d here in MAU reporting: [facility] had labs drawn:potassium and sodium were out of range Was called and advised to be seen at emergency room   Reports she has chest pain, rapid heart rate, SOB, rash, and back pain.  Back pain began earlier today Rash two days ago Chest pain, SOB, rapid heart rate began 4 days ago  Chest pain: like someone is stepping on chest, intermittent, 10/10. Has been going on since her last MAU visit Back pain: 10/10, stabbing, constant. Began today  Reports abdominal cramping that began this evening 8/10  Denies vaginal bleeding or leaking of fluid.  +FM Pain score: refer to note Vitals:   04/15/22 2336  BP: 130/74  Pulse: 77  Resp: 17  Temp: 98.1 F (36.7 C)     FHT:155

## 2022-04-15 NOTE — MAU Provider Note (Signed)
Chief Complaint:  No chief complaint on file.   Event Date/Time   First Provider Initiated Contact with Patient 04/15/22 2347     HPI: Brianna Booth is a 36 y.o. [id number] at 45w0dwho presents to maternity admissions reporting being notified of a severely elevated potassium level today   [facility] to [facility] for a new rash on her arms and betly and had a CMET drawn which was hemolyzed.  This resulted in an elevated K+ level but they called her and told her it was necessary to go to the ER immediately .  She was seen here 3 days ago for an upper respiratory infection (neg Covid) and labs were all normal   States still has sharp substernal chest pain, worsened with deep breath and improved after use of her inhaler (has asthma)   States has nasal congestion, SOB. . She reports good fetal movement, denies LOF, vaginal bleeding, vaginal itching/burning, urinary symptoms, h/a, dizziness, n/v, diarrhea, constipation or fever/chills.  .  Chest Pain  This is a recurrent problem. The current episode started in the past 7 days. The onset quality is gradual. The problem occurs constantly. The problem has been unchanged. The quality of the pain is described as stabbing and tightness. The pain does not radiate. Associated symptoms include nausea. Pertinent negatives include no abdominal pain, back pain, fever, headaches or irregular heartbeat. The pain is aggravated by breathing and deep breathing. Treatments tried: inhaler. The treatment provided moderate relief. Risk factors: asthma.   RN Note: Brianna Booth is a 36 y.o. at [redacted]w[redacted]d here in MAU reporting: [facility] had labs drawn:potassium and sodium were out of range Was called and advised to be seen at emergency room   Reports she has chest pain, rapid heart rate, SOB, rash, and back pain.  Back pain began earlier today Rash two days ago Chest pain, SOB, rapid heart rate began 4 days ago  Chest pain: like someone is stepping on chest,  intermittent, 10/10. Has been going on since her last MAU visit Back pain: 10/10, stabbing, constant. Began today  Reports abdominal cramping that began this evening 8/10  Denies vaginal bleeding or leaking of fluid.  +FM  Past Medical History: Past Medical History:  Diagnosis Date   Anxiety    Asthma    Depression    Essential hypertension 02/22/2019   Headache    Preterm labor     Past obstetric history: OB History  Gravida Para Term Preterm AB Living  14 8 3 5 5 8   SAB IAB Ectopic Multiple Live Births  3 2   0 8    # Outcome Date GA Lbr Len/2nd Weight Sex Delivery Anes PTL Lv  14 Current           13 Preterm 07/04/18 [redacted]w[redacted]d / 00:03 2980 g F Vag-Spont EPI  LIV     Birth Comments: WNL  12 Preterm 05/26/17    M Vag-Spont   LIV  11 Preterm 02/26/14    F Vag-Spont   LIV  10 Term 09/15/11    M Vag-Spont   LIV  9 Preterm 11/13/08    F Vag-Spont  Y LIV  8 Term 05/09/07 [redacted]w[redacted]d   M Vag-Spont  N LIV  7 Term 05/22/05 [redacted]w[redacted]d   F Vag-Spont  N LIV  6 Preterm 07/19/03 [redacted]w[redacted]d   F Vag-Spont  Y LIV  5 IAB           4 IAB  3 SAB           2 SAB           1 SAB             Past Surgical History: Past Surgical History:  Procedure Laterality Date   DILATION AND CURETTAGE OF UTERUS      Family History: Family History  Problem Relation Age of Onset   Hypertension Father     Social History: Social History   Tobacco Use   Smoking status: Former    Types: Cigarettes    Quit date: 12/12/2017    Years since quitting: 4.3   Smokeless tobacco: Never  Vaping Use   Vaping Use: Never used  Substance Use Topics   Alcohol use: Never   Drug use: Never    Allergies: No Known Allergies  Meds:  Medications Prior to Admission  Medication Sig Dispense Refill Last Dose   acetaminophen (TYLENOL) 500 MG tablet Take 1,000 mg by mouth every 6 (six) hours as needed for moderate pain.      albuterol (PROVENTIL HFA;VENTOLIN HFA) 108 (90 Base) MCG/ACT inhaler Inhale 1-2 puffs into the  lungs every 6 (six) hours as needed for wheezing or shortness of breath.      benzonatate (TESSALON PERLES) 100 MG capsule Take 1 capsule (100 mg total) by mouth every 6 (six) hours as needed for up to 20 doses for cough. 20 capsule 0    cyclobenzaprine (FLEXERIL) 10 MG tablet Take 1 tablet (10 mg total) by mouth 3 (three) times daily as needed for muscle spasms. 15 tablet 0    Doxylamine-Pyridoxine 10-10 MG TBEC Take by mouth.      Prenatal Multivit-Min-Fe-FA (PRENATAL VITAMINS) 0.8 MG tablet Take 1 tablet by mouth daily. 30 tablet 12    promethazine (PHENERGAN) 25 MG tablet Take 1 tablet (25 mg total) by mouth every 6 (six) hours as needed for nausea or vomiting. 30 tablet 0     I have reviewed patient's Past Medical Hx, Surgical Hx, Family Hx, Social Hx, medications and allergies.   ROS:  Review of Systems  Constitutional:  Negative for fever.  Cardiovascular:  Positive for chest pain.  Gastrointestinal:  Positive for nausea. Negative for abdominal pain.  Musculoskeletal:  Negative for back pain.  Neurological:  Negative for headaches.   Other systems negative  Physical Exam  Patient Vitals for the past 24 hrs:  BP Temp Temp src Pulse Resp SpO2 Height Weight  04/15/22 2336 130/74 98.1 F (36.7 C) Oral 77 17 100 % 5\' 7"  (1.702 m) 114.5 kg   Constitutional: Well-developed, well-nourished female in no acute distress.  Cardiovascular: normal rate and rhythm Respiratory: normal effort, clear to auscultation bilaterally GI: Abd soft, non-tender, gravid appropriate for gestational age.   No rebound or guarding. MS: Extremities nontender, no edema, normal ROM Neurologic: Alert and oriented x 4.  GU: Neg CVAT. Skin:  Macular rash on arms and abdomen  FHT: 155   Labs: Results for orders placed or performed during the hospital encounter of 04/15/22 (from the past 24 hour(s))  CBC     Status: Abnormal   Collection Time: 04/16/22 12:27 AM  Result Value Ref Range   WBC 9.2 4.0 - 10.5  K/uL   RBC 3.87 3.87 - 5.11 MIL/uL   Hemoglobin 11.5 (L) 12.0 - 15.0 g/dL   HCT 04/18/22 (L) 17.7 - 93.9 %   MCV 86.0 80.0 - 100.0 fL   MCH 29.7 26.0 - 34.0 pg  MCHC 34.5 30.0 - 36.0 g/dL   RDW 51.0 25.8 - 52.7 %   Platelets 164 150 - 400 K/uL   nRBC 0.0 0.0 - 0.2 %  Comprehensive metabolic panel     Status: Abnormal   Collection Time: 04/16/22 12:27 AM  Result Value Ref Range   Sodium 135 135 - 145 mmol/L   Potassium 3.8 3.5 - 5.1 mmol/L   Chloride 107 98 - 111 mmol/L   CO2 19 (L) 22 - 32 mmol/L   Glucose, Bld 102 (H) 70 - 99 mg/dL   BUN 5 (L) 6 - 20 mg/dL   Creatinine, Ser 7.82 (L) 0.44 - 1.00 mg/dL   Calcium 9.0 8.9 - 42.3 mg/dL   Total Protein 6.3 (L) 6.5 - 8.1 g/dL   Albumin 2.9 (L) 3.5 - 5.0 g/dL   AST 18 15 - 41 U/L   ALT 22 0 - 44 U/L   Alkaline Phosphatase 51 38 - 126 U/L   Total Bilirubin 0.4 0.3 - 1.2 mg/dL   GFR, Estimated >53 >61 mL/min   Anion gap 9 5 - 15  Troponin I (High Sensitivity)     Status: None   Collection Time: 04/16/22 12:27 AM  Result Value Ref Range   Troponin I (High Sensitivity) 4 <18 ng/L  Brain natriuretic peptide     Status: None   Collection Time: 04/16/22 12:27 AM  Result Value Ref Range   B Natriuretic Peptide 19.0 0.0 - 100.0 pg/mL       Imaging:  DG CHEST PORT 1 VIEW  Result Date: 04/11/2022 CLINICAL DATA:  Cough, shortness of breath EXAM: PORTABLE CHEST 1 VIEW COMPARISON:  None Available. FINDINGS: The heart size and mediastinal contours are within normal limits. Both lungs are clear. The visualized skeletal structures are unremarkable. IMPRESSION: No active disease. Electronically Signed   By: Duanne Guess D.O.   On: 04/11/2022 15:30    MAU Course/MDM: I have reviewed the triage vital signs and the nursing notes.   Pertinent labs & imaging results that were available during my care of the patient were reviewed by me and considered in my medical decision making (see chart for details).      I have reviewed her medical records  including past results, notes and treatments.   I have ordered labs and reviewed results. Potassium is normal as is AST/ALT.  Discussed the abnormal result at other ER was due to Hemolysis CXR negative EKG NSR  Consult Dr Charlotta Newton with presentation, exam findings and test results. She agrees that Chest CT is not indicated, since EKG normal, no tachycardia, normal oxygenation, improvement with Nebulizer.  Treatments in MAU included Nebulizer albuterol.    Discussed lab problem was related to hemolysis, reassured labs are normal  Discussed chest pain is likely due to URI, has had same discomfort all week Recommend Mucinex and inhaler at home Recommend Claritin for rash, may need steroid burst, will f/u with primary OB  Assessment: Single IUP at [redacted]w[redacted]d Upper Respiratory Infection Abnormal lab result due to hemolysis, repeat labs normal  Chest pain due to URI, Troponin, BNP, CXR and EKG all normal/negative Skin rash, likely allergic  Plan: Discharge home Rx Mucinex for URI Rx Claritin for rash Supportive care Follow up in Office for prenatal visits  Encouraged to return if she develops worsening of symptoms, increase in pain, fever, or other concerning symptoms.   Pt stable at time of discharge.  Wynelle Bourgeois CNM, MSN Certified Nurse-Midwife 04/15/2022 11:47 PM

## 2022-04-16 ENCOUNTER — Inpatient Hospital Stay (HOSPITAL_COMMUNITY): Payer: Medicare HMO

## 2022-04-16 DIAGNOSIS — Z3A20 20 weeks gestation of pregnancy: Secondary | ICD-10-CM

## 2022-04-16 DIAGNOSIS — J069 Acute upper respiratory infection, unspecified: Secondary | ICD-10-CM

## 2022-04-16 DIAGNOSIS — J4520 Mild intermittent asthma, uncomplicated: Secondary | ICD-10-CM

## 2022-04-16 DIAGNOSIS — R899 Unspecified abnormal finding in specimens from other organs, systems and tissues: Secondary | ICD-10-CM

## 2022-04-16 DIAGNOSIS — O0942 Supervision of pregnancy with grand multiparity, second trimester: Secondary | ICD-10-CM | POA: Diagnosis not present

## 2022-04-16 DIAGNOSIS — R21 Rash and other nonspecific skin eruption: Secondary | ICD-10-CM

## 2022-04-16 DIAGNOSIS — R0789 Other chest pain: Secondary | ICD-10-CM

## 2022-04-16 LAB — CBC
HCT: 33.3 % — ABNORMAL LOW (ref 36.0–46.0)
Hemoglobin: 11.5 g/dL — ABNORMAL LOW (ref 12.0–15.0)
MCH: 29.7 pg (ref 26.0–34.0)
MCHC: 34.5 g/dL (ref 30.0–36.0)
MCV: 86 fL (ref 80.0–100.0)
Platelets: 164 10*3/uL (ref 150–400)
RBC: 3.87 MIL/uL (ref 3.87–5.11)
RDW: 14.2 % (ref 11.5–15.5)
WBC: 9.2 10*3/uL (ref 4.0–10.5)
nRBC: 0 % (ref 0.0–0.2)

## 2022-04-16 LAB — BRAIN NATRIURETIC PEPTIDE: B Natriuretic Peptide: 19 pg/mL (ref 0.0–100.0)

## 2022-04-16 LAB — COMPREHENSIVE METABOLIC PANEL
ALT: 22 U/L (ref 0–44)
AST: 18 U/L (ref 15–41)
Albumin: 2.9 g/dL — ABNORMAL LOW (ref 3.5–5.0)
Alkaline Phosphatase: 51 U/L (ref 38–126)
Anion gap: 9 (ref 5–15)
BUN: 5 mg/dL — ABNORMAL LOW (ref 6–20)
CO2: 19 mmol/L — ABNORMAL LOW (ref 22–32)
Calcium: 9 mg/dL (ref 8.9–10.3)
Chloride: 107 mmol/L (ref 98–111)
Creatinine, Ser: 0.42 mg/dL — ABNORMAL LOW (ref 0.44–1.00)
GFR, Estimated: >60 mL/min (ref >60)
Glucose, Bld: 102 mg/dL — ABNORMAL HIGH (ref 70–99)
Potassium: 3.8 mmol/L (ref 3.5–5.1)
Sodium: 135 mmol/L (ref 135–145)
Total Bilirubin: 0.4 mg/dL (ref 0.3–1.2)
Total Protein: 6.3 g/dL — ABNORMAL LOW (ref 6.5–8.1)

## 2022-04-16 LAB — TROPONIN I (HIGH SENSITIVITY): Troponin I (High Sensitivity): 4 ng/L (ref <18)

## 2022-04-16 MED ORDER — LORATADINE 10 MG PO TABS: 10.0000 mg | ORAL_TABLET | Freq: Every day | ORAL | 0 refills | Status: DC | PRN

## 2022-04-16 MED ORDER — LORATADINE 10 MG PO TABS
10.0000 mg | ORAL_TABLET | Freq: Once | ORAL | Status: DC
  Filled 2022-04-16: qty 1

## 2022-04-16 MED ORDER — GUAIFENESIN ER 600 MG PO TB12: 600.0000 mg | ORAL_TABLET | Freq: Two times a day (BID) | ORAL | 0 refills | Status: DC | PRN

## 2022-04-16 MED ORDER — ALBUTEROL SULFATE (2.5 MG/3ML) 0.083% IN NEBU
2.5000 mg | INHALATION_SOLUTION | Freq: Once | RESPIRATORY_TRACT | Status: AC
  Administered 2022-04-16: 2.5 mg via RESPIRATORY_TRACT
  Filled 2022-04-16: qty 3

## 2022-04-16 MED ORDER — GUAIFENESIN ER 600 MG PO TB12
600.0000 mg | ORAL_TABLET | Freq: Once | ORAL | Status: AC
  Administered 2022-04-16: 600 mg via ORAL
  Filled 2022-04-16: qty 1

## 2022-04-30 ENCOUNTER — Other Ambulatory Visit: Payer: Self-pay | Admitting: Obstetrics and Gynecology

## 2022-04-30 DIAGNOSIS — Z363 Encounter for antenatal screening for malformations: Secondary | ICD-10-CM

## 2022-05-04 ENCOUNTER — Ambulatory Visit: Payer: Medicare HMO

## 2022-05-04 ENCOUNTER — Other Ambulatory Visit: Payer: Medicare HMO

## 2022-05-05 ENCOUNTER — Other Ambulatory Visit: Payer: Self-pay | Admitting: *Deleted

## 2022-05-05 ENCOUNTER — Ambulatory Visit: Payer: Medicare HMO | Attending: Obstetrics and Gynecology | Admitting: *Deleted

## 2022-05-05 ENCOUNTER — Ambulatory Visit (HOSPITAL_BASED_OUTPATIENT_CLINIC_OR_DEPARTMENT_OTHER): Payer: Medicare HMO

## 2022-05-05 VITALS — BP 112/75 | HR 86

## 2022-05-05 DIAGNOSIS — O1492 Unspecified pre-eclampsia, second trimester: Secondary | ICD-10-CM | POA: Diagnosis not present

## 2022-05-05 DIAGNOSIS — Z363 Encounter for antenatal screening for malformations: Secondary | ICD-10-CM

## 2022-05-05 DIAGNOSIS — O283 Abnormal ultrasonic finding on antenatal screening of mother: Secondary | ICD-10-CM | POA: Diagnosis not present

## 2022-05-05 DIAGNOSIS — O10912 Unspecified pre-existing hypertension complicating pregnancy, second trimester: Secondary | ICD-10-CM

## 2022-05-05 DIAGNOSIS — O09299 Supervision of pregnancy with other poor reproductive or obstetric history, unspecified trimester: Secondary | ICD-10-CM

## 2022-05-05 DIAGNOSIS — Z3A22 22 weeks gestation of pregnancy: Secondary | ICD-10-CM | POA: Diagnosis not present

## 2022-05-05 DIAGNOSIS — O09292 Supervision of pregnancy with other poor reproductive or obstetric history, second trimester: Secondary | ICD-10-CM | POA: Diagnosis not present

## 2022-05-05 DIAGNOSIS — O09522 Supervision of elderly multigravida, second trimester: Secondary | ICD-10-CM

## 2022-05-05 DIAGNOSIS — O99212 Obesity complicating pregnancy, second trimester: Secondary | ICD-10-CM | POA: Diagnosis not present

## 2022-05-05 DIAGNOSIS — O0942 Supervision of pregnancy with grand multiparity, second trimester: Secondary | ICD-10-CM

## 2022-06-05 DIAGNOSIS — I10 Essential (primary) hypertension: Secondary | ICD-10-CM | POA: Diagnosis not present

## 2022-06-05 DIAGNOSIS — Z3A27 27 weeks gestation of pregnancy: Secondary | ICD-10-CM | POA: Diagnosis not present

## 2022-06-05 DIAGNOSIS — O162 Unspecified maternal hypertension, second trimester: Secondary | ICD-10-CM | POA: Diagnosis not present

## 2022-06-05 DIAGNOSIS — F319 Bipolar disorder, unspecified: Secondary | ICD-10-CM | POA: Diagnosis not present

## 2022-06-05 DIAGNOSIS — O26892 Other specified pregnancy related conditions, second trimester: Secondary | ICD-10-CM | POA: Diagnosis not present

## 2022-06-05 DIAGNOSIS — Z3689 Encounter for other specified antenatal screening: Secondary | ICD-10-CM | POA: Diagnosis not present

## 2022-06-09 ENCOUNTER — Ambulatory Visit: Payer: Medicare HMO | Admitting: *Deleted

## 2022-06-09 ENCOUNTER — Other Ambulatory Visit: Payer: Self-pay | Admitting: *Deleted

## 2022-06-09 ENCOUNTER — Ambulatory Visit: Payer: Medicare HMO | Attending: Maternal & Fetal Medicine

## 2022-06-09 VITALS — BP 129/69 | HR 93

## 2022-06-09 DIAGNOSIS — G510 Bell's palsy: Secondary | ICD-10-CM

## 2022-06-09 DIAGNOSIS — O09299 Supervision of pregnancy with other poor reproductive or obstetric history, unspecified trimester: Secondary | ICD-10-CM

## 2022-06-09 DIAGNOSIS — O10912 Unspecified pre-existing hypertension complicating pregnancy, second trimester: Secondary | ICD-10-CM | POA: Diagnosis not present

## 2022-06-09 DIAGNOSIS — O368990 Maternal care for other specified fetal problems, unspecified trimester, not applicable or unspecified: Secondary | ICD-10-CM

## 2022-06-09 DIAGNOSIS — O0942 Supervision of pregnancy with grand multiparity, second trimester: Secondary | ICD-10-CM | POA: Insufficient documentation

## 2022-06-09 DIAGNOSIS — O09522 Supervision of elderly multigravida, second trimester: Secondary | ICD-10-CM | POA: Diagnosis not present

## 2022-06-09 DIAGNOSIS — O09899 Supervision of other high risk pregnancies, unspecified trimester: Secondary | ICD-10-CM

## 2022-06-09 DIAGNOSIS — O99212 Obesity complicating pregnancy, second trimester: Secondary | ICD-10-CM

## 2022-06-19 DIAGNOSIS — F250 Schizoaffective disorder, bipolar type: Secondary | ICD-10-CM | POA: Diagnosis not present

## 2022-06-19 DIAGNOSIS — Z118 Encounter for screening for other infectious and parasitic diseases: Secondary | ICD-10-CM | POA: Diagnosis not present

## 2022-06-19 DIAGNOSIS — Z3A29 29 weeks gestation of pregnancy: Secondary | ICD-10-CM | POA: Diagnosis not present

## 2022-06-19 DIAGNOSIS — Z114 Encounter for screening for human immunodeficiency virus [HIV]: Secondary | ICD-10-CM | POA: Diagnosis not present

## 2022-06-19 DIAGNOSIS — Z113 Encounter for screening for infections with a predominantly sexual mode of transmission: Secondary | ICD-10-CM | POA: Diagnosis not present

## 2022-06-19 DIAGNOSIS — N898 Other specified noninflammatory disorders of vagina: Secondary | ICD-10-CM | POA: Diagnosis not present

## 2022-06-19 DIAGNOSIS — F4312 Post-traumatic stress disorder, chronic: Secondary | ICD-10-CM | POA: Diagnosis not present

## 2022-06-19 DIAGNOSIS — Z1159 Encounter for screening for other viral diseases: Secondary | ICD-10-CM | POA: Diagnosis not present

## 2022-06-19 DIAGNOSIS — Z3689 Encounter for other specified antenatal screening: Secondary | ICD-10-CM | POA: Diagnosis not present

## 2022-06-19 DIAGNOSIS — O10013 Pre-existing essential hypertension complicating pregnancy, third trimester: Secondary | ICD-10-CM | POA: Diagnosis not present

## 2022-06-19 DIAGNOSIS — F819 Developmental disorder of scholastic skills, unspecified: Secondary | ICD-10-CM | POA: Diagnosis not present

## 2022-06-26 ENCOUNTER — Ambulatory Visit: Payer: Medicare HMO | Admitting: Family

## 2022-07-02 ENCOUNTER — Encounter: Payer: Self-pay | Admitting: *Deleted

## 2022-07-07 ENCOUNTER — Ambulatory Visit (HOSPITAL_BASED_OUTPATIENT_CLINIC_OR_DEPARTMENT_OTHER): Payer: Medicare HMO

## 2022-07-07 ENCOUNTER — Encounter (HOSPITAL_COMMUNITY): Payer: Self-pay | Admitting: Obstetrics & Gynecology

## 2022-07-07 ENCOUNTER — Ambulatory Visit: Payer: Medicare HMO | Admitting: *Deleted

## 2022-07-07 ENCOUNTER — Inpatient Hospital Stay (HOSPITAL_COMMUNITY)
Admission: AD | Admit: 2022-07-07 | Discharge: 2022-07-10 | DRG: 832 | Disposition: A | Discharge status: Other Acute Inpatient Hospital | Payer: Medicare HMO | Attending: Obstetrics & Gynecology | Admitting: Obstetrics & Gynecology

## 2022-07-07 VITALS — BP 135/76 | HR 101

## 2022-07-07 DIAGNOSIS — R0602 Shortness of breath: Secondary | ICD-10-CM | POA: Diagnosis not present

## 2022-07-07 DIAGNOSIS — G510 Bell's palsy: Secondary | ICD-10-CM | POA: Insufficient documentation

## 2022-07-07 DIAGNOSIS — O99213 Obesity complicating pregnancy, third trimester: Secondary | ICD-10-CM

## 2022-07-07 DIAGNOSIS — F419 Anxiety disorder, unspecified: Secondary | ICD-10-CM | POA: Diagnosis present

## 2022-07-07 DIAGNOSIS — J4520 Mild intermittent asthma, uncomplicated: Secondary | ICD-10-CM

## 2022-07-07 DIAGNOSIS — Z3A32 32 weeks gestation of pregnancy: Secondary | ICD-10-CM | POA: Diagnosis not present

## 2022-07-07 DIAGNOSIS — O10013 Pre-existing essential hypertension complicating pregnancy, third trimester: Secondary | ICD-10-CM | POA: Diagnosis present

## 2022-07-07 DIAGNOSIS — F319 Bipolar disorder, unspecified: Secondary | ICD-10-CM | POA: Diagnosis present

## 2022-07-07 DIAGNOSIS — O113 Pre-existing hypertension with pre-eclampsia, third trimester: Secondary | ICD-10-CM | POA: Diagnosis not present

## 2022-07-07 DIAGNOSIS — O99513 Diseases of the respiratory system complicating pregnancy, third trimester: Secondary | ICD-10-CM | POA: Diagnosis present

## 2022-07-07 DIAGNOSIS — O09213 Supervision of pregnancy with history of pre-term labor, third trimester: Secondary | ICD-10-CM

## 2022-07-07 DIAGNOSIS — E669 Obesity, unspecified: Secondary | ICD-10-CM | POA: Diagnosis not present

## 2022-07-07 DIAGNOSIS — G43919 Migraine, unspecified, intractable, without status migrainosus: Secondary | ICD-10-CM | POA: Diagnosis present

## 2022-07-07 DIAGNOSIS — R109 Unspecified abdominal pain: Secondary | ICD-10-CM

## 2022-07-07 DIAGNOSIS — O119 Pre-existing hypertension with pre-eclampsia, unspecified trimester: Principal | ICD-10-CM

## 2022-07-07 DIAGNOSIS — O99212 Obesity complicating pregnancy, second trimester: Secondary | ICD-10-CM | POA: Insufficient documentation

## 2022-07-07 DIAGNOSIS — O99352 Diseases of the nervous system complicating pregnancy, second trimester: Secondary | ICD-10-CM | POA: Insufficient documentation

## 2022-07-07 DIAGNOSIS — O99353 Diseases of the nervous system complicating pregnancy, third trimester: Secondary | ICD-10-CM | POA: Diagnosis not present

## 2022-07-07 DIAGNOSIS — F1290 Cannabis use, unspecified, uncomplicated: Secondary | ICD-10-CM | POA: Diagnosis present

## 2022-07-07 DIAGNOSIS — O09293 Supervision of pregnancy with other poor reproductive or obstetric history, third trimester: Secondary | ICD-10-CM

## 2022-07-07 DIAGNOSIS — O09899 Supervision of other high risk pregnancies, unspecified trimester: Secondary | ICD-10-CM | POA: Insufficient documentation

## 2022-07-07 DIAGNOSIS — R519 Headache, unspecified: Secondary | ICD-10-CM | POA: Diagnosis not present

## 2022-07-07 DIAGNOSIS — Z3A31 31 weeks gestation of pregnancy: Secondary | ICD-10-CM | POA: Diagnosis not present

## 2022-07-07 DIAGNOSIS — O368990 Maternal care for other specified fetal problems, unspecified trimester, not applicable or unspecified: Secondary | ICD-10-CM | POA: Insufficient documentation

## 2022-07-07 DIAGNOSIS — R112 Nausea with vomiting, unspecified: Secondary | ICD-10-CM

## 2022-07-07 DIAGNOSIS — O09299 Supervision of pregnancy with other poor reproductive or obstetric history, unspecified trimester: Secondary | ICD-10-CM | POA: Insufficient documentation

## 2022-07-07 DIAGNOSIS — O99333 Smoking (tobacco) complicating pregnancy, third trimester: Secondary | ICD-10-CM | POA: Diagnosis not present

## 2022-07-07 DIAGNOSIS — R1011 Right upper quadrant pain: Secondary | ICD-10-CM

## 2022-07-07 DIAGNOSIS — O1413 Severe pre-eclampsia, third trimester: Secondary | ICD-10-CM | POA: Diagnosis not present

## 2022-07-07 DIAGNOSIS — O99343 Other mental disorders complicating pregnancy, third trimester: Secondary | ICD-10-CM | POA: Diagnosis present

## 2022-07-07 DIAGNOSIS — F259 Schizoaffective disorder, unspecified: Secondary | ICD-10-CM | POA: Diagnosis not present

## 2022-07-07 DIAGNOSIS — O10912 Unspecified pre-existing hypertension complicating pregnancy, second trimester: Secondary | ICD-10-CM | POA: Insufficient documentation

## 2022-07-07 DIAGNOSIS — J45909 Unspecified asthma, uncomplicated: Secondary | ICD-10-CM | POA: Diagnosis present

## 2022-07-07 DIAGNOSIS — O09523 Supervision of elderly multigravida, third trimester: Secondary | ICD-10-CM | POA: Insufficient documentation

## 2022-07-07 DIAGNOSIS — O99323 Drug use complicating pregnancy, third trimester: Secondary | ICD-10-CM | POA: Diagnosis present

## 2022-07-07 DIAGNOSIS — F250 Schizoaffective disorder, bipolar type: Secondary | ICD-10-CM | POA: Diagnosis not present

## 2022-07-07 DIAGNOSIS — O1403 Mild to moderate pre-eclampsia, third trimester: Secondary | ICD-10-CM | POA: Diagnosis not present

## 2022-07-07 DIAGNOSIS — O09522 Supervision of elderly multigravida, second trimester: Secondary | ICD-10-CM | POA: Insufficient documentation

## 2022-07-07 DIAGNOSIS — F17210 Nicotine dependence, cigarettes, uncomplicated: Secondary | ICD-10-CM | POA: Diagnosis not present

## 2022-07-07 LAB — URINALYSIS, ROUTINE W REFLEX MICROSCOPIC
Bilirubin Urine: NEGATIVE
Glucose, UA: NEGATIVE mg/dL
Hgb urine dipstick: NEGATIVE
Ketones, ur: NEGATIVE mg/dL
Leukocytes,Ua: NEGATIVE
Nitrite: NEGATIVE
Protein, ur: 30 mg/dL — AB
Specific Gravity, Urine: 1.028 (ref 1.005–1.030)
pH: 5 (ref 5.0–8.0)

## 2022-07-07 NOTE — MAU Provider Note (Signed)
Chief Complaint:  Emesis During Pregnancy, Headache, and Contractions   Event Date/Time   First Provider Initiated Contact with Patient 07/07/22 2342     HPI: Brianna Booth is a 36 y.o. [id number] at 40w6dwho presents to maternity admissions reporting vomiting and coughing.  Has been vomiting entire pregnancy, did not take phenergan.  Coughing for a week, coughed up some blood today.  Headache and right leg pain.  Has not tried anything for headache.  Has known chronic hypertension. Not taking any meds. . . She reports good fetal movement, denies LOF, vaginal bleeding, vaginal itching/burning, urinary symptoms,dizziness, diarrhea, constipation or fever/chills.  She denies visual changes  Headache  This is a new problem. The current episode started today. The problem occurs constantly. The pain is located in the Left unilateral region. The quality of the pain is described as dull. The pain is at a severity of 10/10. Associated symptoms include abdominal pain, nausea and vomiting. Pertinent negatives include no blurred vision, coughing, fever, muscle aches or visual change. Nothing aggravates the symptoms. She has tried nothing for the symptoms.  Emesis  This is a recurrent problem. The problem has been unchanged. There has been no fever. Associated symptoms include abdominal pain and headaches. Pertinent negatives include no coughing, diarrhea or fever. She has tried nothing for the symptoms.   RN Note: Brianna Booth is a 36 y.o. at [redacted]w[redacted]d here in MAU reporting vomiting couple times today. She kept coughing and was coughing up some blood. Reports having a headache on L side of her head today. Entire R side hurts down to her leg esp when walking or lying in certain position for awhile.  Having some ctxs today Reports good FM. Denies VB or LOF.   Onset of complaint: today      Pain score: 10 for H/A, 10 R side, 10 for ctx  Past Medical History: Past Medical History:  Diagnosis Date   Anxiety     Asthma    Bipolar 1 disorder (HCC)    Depression    Essential hypertension 02/22/2019   Headache    Preterm labor    Schizo-affective schizophrenia (HCC)     Past obstetric history: OB History  Gravida Para Term Preterm AB Living  SAB IAB Ectopic Multiple Live Births  3 2   0 8    # Outcome Date GA Lbr Len/2nd Weight Sex Delivery Anes PTL Lv  14 Current           13 Preterm 07/04/18 104w2d / 00:03 2980 g F Vag-Spont EPI  LIV     Birth Comments: WNL  12 Preterm 05/26/17    M Vag-Spont   LIV  11 Preterm 02/26/14    F Vag-Spont   LIV  10 Term 09/15/11    M Vag-Spont   LIV  9 Preterm 11/13/08    F Vag-Spont  Y LIV  8 Term 05/09/07 [redacted]w[redacted]d   M Vag-Spont  N LIV  7 Term 05/22/05 [redacted]w[redacted]d   F Vag-Spont  N LIV  6 Preterm 07/19/03 [redacted]w[redacted]d   F Vag-Spont  Y LIV  5 IAB           4 IAB           3 SAB           2 SAB           1 SAB             Past  Surgical History: Past Surgical History:  Procedure Laterality Date   BACK SURGERY     DILATION AND CURETTAGE OF UTERUS      Family History: Family History  Problem Relation Age of Onset   Hypertension Father    Diabetes Brother    Diabetes Maternal Aunt    Asthma Maternal Grandfather    Cancer Neg Hx    Heart disease Neg Hx     Social History: Social History   Tobacco Use   Smoking status: Some Days    Types: Cigarettes    Last attempt to quit: 12/12/2017    Years since quitting: 4.5   Smokeless tobacco: Never  Vaping Use   Vaping Use: Never used  Substance Use Topics   Alcohol use: Not Currently   Drug use: Yes    Types: Marijuana    Comment: last use may 2023    Allergies: No Known Allergies  Meds:  Medications Prior to Admission  Medication Sig Dispense Refill Last Dose   albuterol (PROVENTIL HFA;VENTOLIN HFA) 108 (90 Base) MCG/ACT inhaler Inhale 1-2 puffs into the lungs every 6 (six) hours as needed for wheezing or shortness of breath.      cyclobenzaprine (FLEXERIL) 10 MG tablet Take 1 tablet (10 mg  total) by mouth 3 (three) times daily as needed for muscle spasms. 15 tablet 0    Prenatal Multivit-Min-Fe-FA (PRENATAL VITAMINS) 0.8 MG tablet Take 1 tablet by mouth daily. 30 tablet 12    promethazine (PHENERGAN) 25 MG tablet Take 1 tablet (25 mg total) by mouth every 6 (six) hours as needed for nausea or vomiting. 30 tablet 0     I have reviewed patient's Past Medical Hx, Surgical Hx, Family Hx, Social Hx, medications and allergies.   ROS:  Review of Systems  Constitutional:  Negative for fever.  Eyes:  Negative for blurred vision.  Respiratory:  Negative for cough.   Gastrointestinal:  Positive for abdominal pain, nausea and vomiting. Negative for diarrhea.  Neurological:  Positive for headaches.   Other systems negative  Physical Exam  Patient Vitals for the past 24 hrs:  BP Temp Pulse Resp SpO2 Height Weight  07/07/22 2338 135/67 -- 77 -- -- -- --  07/07/22 2312 (!) 144/98 -- -- -- -- -- --  07/07/22 2302 -- 98.1 F (36.7 C) 80 20 98 % 5\' 7"  (1.702 m) 119.7 kg   Vitals:   07/07/22 2346 07/07/22 2348 07/07/22 2350 07/08/22 0010  BP: (!) 160/95 (!) 174/90 (!) 180/100 (!) 168/98  Pulse: 80 82 82 82  Resp:      Temp:      SpO2:      Weight:      Height:       Vitals:   07/08/22 0115 07/08/22 0120 07/08/22 0125 07/08/22 0130  BP: (!) 169/83   (!) 147/77  Pulse: 75   81  Resp:      Temp:      SpO2: 99% 99% 98% 99%  Weight:      Height:        Constitutional: Well-developed, well-nourished female in no acute distress.  Cardiovascular: normal rate and rhythm Respiratory: normal effort, clear to auscultation bilaterally, no wheezes GI: Abd soft, RUQ tender, gravid appropriate for gestational age.   No rebound or guarding. MS: Extremities nontender, no edema, normal ROM Neurologic: Alert and oriented x 4.  GU: Neg CVAT.    FHT:  Baseline 135 , moderate variability, accelerations present, no decelerations Contractions: Occasional  Labs: Results for orders  placed or performed during the hospital encounter of 07/07/22 (from the past 24 hour(s))  Urinalysis, Routine w reflex microscopic Urine, Clean Catch     Status: Abnormal   Collection Time: 07/07/22 11:09 PM  Result Value Ref Range   Color, Urine YELLOW YELLOW   APPearance HAZY (A) CLEAR   Specific Gravity, Urine 1.028 1.005 - 1.030   pH 5.0 5.0 - 8.0   Glucose, UA NEGATIVE NEGATIVE mg/dL   Hgb urine dipstick NEGATIVE NEGATIVE   Bilirubin Urine NEGATIVE NEGATIVE   Ketones, ur NEGATIVE NEGATIVE mg/dL   Protein, ur 30 (A) NEGATIVE mg/dL   Nitrite NEGATIVE NEGATIVE   Leukocytes,Ua NEGATIVE NEGATIVE   RBC / HPF 0-5 0 - 5 RBC/hpf   WBC, UA 0-5 0 - 5 WBC/hpf   Bacteria, UA RARE (A) NONE SEEN   Squamous Epithelial / LPF 21-50 0 - 5   Mucus PRESENT   Protein / creatinine ratio, urine     Status: None   Collection Time: 07/07/22 11:09 PM  Result Value Ref Range   Creatinine, Urine 229 mg/dL   Total Protein, Urine 8 mg/dL   Protein Creatinine Ratio 0.03 0.00 - 0.15 mg/mg[Cre]  CBC     Status: Abnormal   Collection Time: 07/08/22 12:09 AM  Result Value Ref Range   WBC 9.2 4.0 - 10.5 K/uL   RBC 4.01 3.87 - 5.11 MIL/uL   Hemoglobin 11.7 (L) 12.0 - 15.0 g/dL   HCT 16.1 09.6 - 04.5 %   MCV 91.0 80.0 - 100.0 fL   MCH 29.2 26.0 - 34.0 pg   MCHC 32.1 30.0 - 36.0 g/dL   RDW 40.9 81.1 - 91.4 %   Platelets 199 150 - 400 K/uL   nRBC 0.0 0.0 - 0.2 %  Comprehensive metabolic panel     Status: Abnormal   Collection Time: 07/08/22 12:09 AM  Result Value Ref Range   Sodium 137 135 - 145 mmol/L   Potassium 3.5 3.5 - 5.1 mmol/L   Chloride 106 98 - 111 mmol/L   CO2 23 22 - 32 mmol/L   Glucose, Bld 88 70 - 99 mg/dL   BUN 5 (L) 6 - 20 mg/dL   Creatinine, Ser 7.82 0.44 - 1.00 mg/dL   Calcium 9.4 8.9 - 95.6 mg/dL   Total Protein 6.1 (L) 6.5 - 8.1 g/dL   Albumin 2.7 (L) 3.5 - 5.0 g/dL   AST 19 15 - 41 U/L   ALT 18 0 - 44 U/L   Alkaline Phosphatase 96 38 - 126 U/L   Total Bilirubin 0.1 (L) 0.3 -  1.2 mg/dL   GFR, Estimated >21 >30 mL/min   Anion gap 8 5 - 15     Imaging:  DG CHEST PORT 1 VIEW  Result Date: 07/08/2022 CLINICAL DATA:  Shortness of breath. Emesis during pregnancy, headache, contractions. EXAM: PORTABLE CHEST 1 VIEW COMPARISON:  04/16/2022. FINDINGS: The heart is borderline enlarged and mediastinal contours are within normal limits. Both lungs are clear. No acute osseous abnormality. IMPRESSION: No active disease. Electronically Signed   By: Thornell Sartorius M.D.   On: 07/08/2022 01:32   US Abdomen Limited RUQ (LIVER/GB)  Result Date: 07/08/2022 CLINICAL DATA:  Right upper quadrant pain EXAM: ULTRASOUND ABDOMEN LIMITED RIGHT UPPER QUADRANT COMPARISON:  02/19/2018 abdominal ultrasound FINDINGS: Gallbladder: The gallbladder is contracted, limiting evaluation. The wall measures 3 mm, the upper limit of normal. A positive sonographic Eulah Pont sign is noted by sonographer. No pericholecystic  fluid. No sludge or stones. Common bile duct: Diameter: 4 mm, within normal limits. No intrahepatic biliary ductal dilatation. Liver: No focal lesion identified. Within normal limits in parenchymal echogenicity. Portal vein is patent on color Doppler imaging with normal direction of blood flow towards the liver. Other: None. IMPRESSION: The gallbladder is contracted, limiting evaluation. A positive sonographic Eulah Pont sign is noted by sonographer. No other signs of acute cholecystitis. Correlate with lab values and clinical appearance. Electronically Signed   By: Wiliam Ke M.D.   On: 07/08/2022 01:04   [place] MFM OB FOLLOW UP  Result Date: 07/07/2022 ----------------------------------------------------------------------  OBSTETRICS REPORT                       (Signed Final 07/07/2022 12:56 pm) ---------------------------------------------------------------------- Patient Info  ID #:       [id number]                          D.O.B.:  04-15-1986 (36 yrs)  Name:       Brianna Booth                  Visit  Date: 07/07/2022 12:03 pm ---------------------------------------------------------------------- Performed By  Attending:        Ma Rings MD         Ref. Address:     [address] [place], [place]  Performed By:     Fayne Norrie BS,      [facility]                                [facility]  Referred By:      [facility] ---------------------------------------------------------------------- Orders  #  Description                           Code        Ordered By  1  [place] MFM OB FOLLOW UP                   40981.19    Noralee Space ----------------------------------------------------------------------  #  Order #  Accession #                Episode #  1  [id number]                   [id number]                 [id number] ---------------------------------------------------------------------- Indications  Poor obstetric history: Previous               O09.299  preeclampsia / eclampsia/gestational HTN  Advanced maternal age multigravida 67+,        O43.522  second trimester  Poor obstetric history: Previous preterm       O09.219  delivery, antepartum  Obesity complicating pregnancy, second         O99.212  trimester  Hypertension - Chronic/Pre-existing (no        O10.019  meds)  [redacted] weeks gestation of pregnancy                Z3A.31  LR Female ---------------------------------------------------------------------- Fetal Evaluation  Num Of Fetuses:         1  Fetal Heart Rate(bpm):  145  Cardiac Activity:       Observed  Presentation:           Cephalic  Placenta:               Anterior  P. Cord Insertion:      Previously Visualized  Amniotic Fluid  AFI FV:      Within normal limits  AFI Sum(cm)     %Tile       Largest Pocket(cm)  11.6            28           3.9  RUQ(cm)       RLQ(cm)       LUQ(cm)        LLQ(cm)  3.7           1.3           3.9            2.7 ---------------------------------------------------------------------- Biometry  BPD:      82.4  mm     G. Age:  33w 1d         78  %    CI:        74.81   %    70 - 86                                                          FL/HC:      19.1   %    19.1 - 21.3  HC:      302.3  mm     G. Age:  33w 4d         60  %    HC/AC:      1.02        0.96 - 1.17  AC:      297.1  mm     G. Age:  33w 5d         92  %    FL/BPD:     70.0   %    71 - 87  FL:       57.7  mm  G. Age:  30w 1d          6  %    FL/AC:      19.4   %    20 - 24  Est. FW:    2016  gm      4 lb 7 oz     65  % ---------------------------------------------------------------------- OB History  Gravidity:    14        Term:   3        Prem:   5        SAB:   3  TOP:          2        Living:  8 ---------------------------------------------------------------------- Gestational Age  LMP:           31w 6d        Date:  11/26/21                  EDD:   09/02/22  U/S Today:     32w 5d                                        EDD:   08/27/22  Best:          31w 6d     Det. By:  LMP  (11/26/21)          EDD:   09/02/22 ---------------------------------------------------------------------- Anatomy  Cranium:               Appears normal         LVOT:                   Appears normal  Cavum:                 Appears normal         Aortic Arch:            Previously seen  Ventricles:            Previously seen        Ductal Arch:            Previously seen  Choroid Plexus:        Previously seen        Diaphragm:              Previously seen  Cerebellum:            Previously seen        Stomach:                Appears normal, left                                                                        sided  Posterior Fossa:       Previously seen        Abdomen:                Previously seen  Nuchal Fold:           Not applicable (>20    Abdominal Wall:  Previously seen                         wks GA)  Face:                  Orbits and profile     Cord Vessels:           Previously seen                         previously seen  Lips:                  Previously seen        Kidneys:                Appear normal  Palate:                Not well visualized    Bladder:                Appears normal  Thoracic:              Previously seen        Spine:                  Not well visualized  Heart:                 Appears normal         Upper Extremities:      Previously seen                         (4CH, axis, and                         situs)  RVOT:                  Previously seen        Lower Extremities:      Previously seen  Other:  Previously fetus appears to be a female. Nasal bone, Lenses, VC, 3VV          and 3VTV previously visualized. Technically difficult due to maternal          habitus and fetal position. ---------------------------------------------------------------------- Cervix Uterus Adnexa  Cervix  Not visualized (advanced GA >24wks) ---------------------------------------------------------------------- Comments  This patient was seen for a follow up growth scan due to  advanced maternal age and maternal obesity.  She denies  any problems since her last exam.  She was informed that the fetal growth and amniotic fluid  level appears appropriate for her gestational age.  As the fetal growth is within normal limits, no further exams  were scheduled in our office. ----------------------------------------------------------------------                   Ma Rings, MD Electronically Signed Final Report   07/07/2022 12:56 pm ----------------------------------------------------------------------    MAU Course/MDM: I have reviewed the triage vital signs and the nursing notes.   Pertinent labs & imaging results that were available during my care of the patient were reviewed by me and considered in my medical decision making (see chart for details).      I  have reviewed her medical records including past results, notes and treatments.   I have ordered labs and reviewed results. Labs are normal  NST reviewed .  Treatments in MAU included IV fluids for  rehydration, Zofran for nausea, Excedrin Tension for headache, Pepcid for   vomiting/hematemesis.    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  Magnesium Sulfate started due to severe BPs and Headache  States got SOB with walk down hallway, has asthma. Inhaler ordered and CXR done to rule out pulmonary edema (thought O2 sats have been good). CXR normal   Consult Drs Jolayne Panther and Mody with presentation, exam findings and test results    Dr Juliene Pina recommends admission with Magnesium Sulfate infusion.   Assessment: Single IUP at [redacted]w[redacted]d Chronic Hypertension with superimposed preeclampsia with severe range BPs and Headache  Asthma Nausea Right sided pain, positive Murphy Sign but no cholelithiasis ? Right sided sciatica  Plan: Admit to OB SCU Routine orders Magnesium Sulfate MD to follow  Wynelle Bourgeois CNM, MSN Certified Nurse-Midwife 07/07/2022 11:42 PM

## 2022-07-07 NOTE — MAU Note (Signed)
.  Brianna Booth is a 36 y.o. at [redacted]w[redacted]d here in MAU reporting vomiting couple times today. She kept coughing and was coughing up some blood. Reports having a headache on L side of her head today. Entire R side hurts down to her leg esp when walking or lying in certain position for awhile.  Having some ctxs today Reports good FM. Denies VB or LOF.   Onset of complaint: today Pain score: 10 for H/A, 10 R side, 10 for ctx Vitals:   07/07/22 2302  Pulse: 80  Resp: 20  Temp: 98.1 F (36.7 C)  SpO2: 98%     FHT:151 Lab orders placed from triage:  u/a

## 2022-07-08 ENCOUNTER — Inpatient Hospital Stay (HOSPITAL_COMMUNITY): Payer: Medicare HMO

## 2022-07-08 ENCOUNTER — Other Ambulatory Visit: Payer: Self-pay

## 2022-07-08 DIAGNOSIS — O99353 Diseases of the nervous system complicating pregnancy, third trimester: Secondary | ICD-10-CM | POA: Diagnosis present

## 2022-07-08 DIAGNOSIS — O99513 Diseases of the respiratory system complicating pregnancy, third trimester: Secondary | ICD-10-CM | POA: Diagnosis present

## 2022-07-08 DIAGNOSIS — O119 Pre-existing hypertension with pre-eclampsia, unspecified trimester: Secondary | ICD-10-CM | POA: Diagnosis not present

## 2022-07-08 DIAGNOSIS — R112 Nausea with vomiting, unspecified: Secondary | ICD-10-CM

## 2022-07-08 DIAGNOSIS — O99213 Obesity complicating pregnancy, third trimester: Secondary | ICD-10-CM | POA: Diagnosis present

## 2022-07-08 DIAGNOSIS — J4520 Mild intermittent asthma, uncomplicated: Secondary | ICD-10-CM | POA: Diagnosis not present

## 2022-07-08 DIAGNOSIS — O99333 Smoking (tobacco) complicating pregnancy, third trimester: Secondary | ICD-10-CM | POA: Diagnosis present

## 2022-07-08 DIAGNOSIS — O10013 Pre-existing essential hypertension complicating pregnancy, third trimester: Secondary | ICD-10-CM | POA: Diagnosis present

## 2022-07-08 DIAGNOSIS — O99323 Drug use complicating pregnancy, third trimester: Secondary | ICD-10-CM | POA: Diagnosis present

## 2022-07-08 DIAGNOSIS — F17210 Nicotine dependence, cigarettes, uncomplicated: Secondary | ICD-10-CM | POA: Diagnosis present

## 2022-07-08 DIAGNOSIS — F1290 Cannabis use, unspecified, uncomplicated: Secondary | ICD-10-CM | POA: Diagnosis present

## 2022-07-08 DIAGNOSIS — O99343 Other mental disorders complicating pregnancy, third trimester: Secondary | ICD-10-CM

## 2022-07-08 DIAGNOSIS — F419 Anxiety disorder, unspecified: Secondary | ICD-10-CM | POA: Diagnosis present

## 2022-07-08 DIAGNOSIS — O113 Pre-existing hypertension with pre-eclampsia, third trimester: Secondary | ICD-10-CM | POA: Diagnosis not present

## 2022-07-08 DIAGNOSIS — G43919 Migraine, unspecified, intractable, without status migrainosus: Secondary | ICD-10-CM | POA: Diagnosis present

## 2022-07-08 DIAGNOSIS — F259 Schizoaffective disorder, unspecified: Secondary | ICD-10-CM | POA: Diagnosis present

## 2022-07-08 DIAGNOSIS — Z3A32 32 weeks gestation of pregnancy: Secondary | ICD-10-CM | POA: Diagnosis not present

## 2022-07-08 DIAGNOSIS — R1011 Right upper quadrant pain: Secondary | ICD-10-CM

## 2022-07-08 DIAGNOSIS — J45909 Unspecified asthma, uncomplicated: Secondary | ICD-10-CM | POA: Diagnosis present

## 2022-07-08 DIAGNOSIS — F319 Bipolar disorder, unspecified: Secondary | ICD-10-CM | POA: Diagnosis present

## 2022-07-08 DIAGNOSIS — R519 Headache, unspecified: Secondary | ICD-10-CM | POA: Diagnosis present

## 2022-07-08 DIAGNOSIS — R109 Unspecified abdominal pain: Secondary | ICD-10-CM

## 2022-07-08 LAB — CBC WITH DIFFERENTIAL/PLATELET
Abs Immature Granulocytes: 0.07 10*3/uL (ref 0.00–0.07)
Basophils Absolute: 0 10*3/uL (ref 0.0–0.1)
Basophils Relative: 0 %
Eosinophils Absolute: 0 10*3/uL (ref 0.0–0.5)
Eosinophils Relative: 0 %
HCT: 37 % (ref 36.0–46.0)
Hemoglobin: 12.5 g/dL (ref 12.0–15.0)
Immature Granulocytes: 1 %
Lymphocytes Relative: 14 %
Lymphs Abs: 1.2 10*3/uL (ref 0.7–4.0)
MCH: 29.8 pg (ref 26.0–34.0)
MCHC: 33.8 g/dL (ref 30.0–36.0)
MCV: 88.3 fL (ref 80.0–100.0)
Monocytes Absolute: 0.2 10*3/uL (ref 0.1–1.0)
Monocytes Relative: 2 %
Neutro Abs: 7 10*3/uL (ref 1.7–7.7)
Neutrophils Relative %: 83 %
Platelets: 230 10*3/uL (ref 150–400)
RBC: 4.19 MIL/uL (ref 3.87–5.11)
RDW: 14.1 % (ref 11.5–15.5)
WBC: 8.5 10*3/uL (ref 4.0–10.5)
nRBC: 0 % (ref 0.0–0.2)

## 2022-07-08 LAB — RAPID URINE DRUG SCREEN, HOSP PERFORMED
Amphetamines: NOT DETECTED
Barbiturates: POSITIVE — AB
Benzodiazepines: NOT DETECTED
Cocaine: NOT DETECTED
Opiates: NOT DETECTED
Tetrahydrocannabinol: NOT DETECTED

## 2022-07-08 LAB — CBC
HCT: 36.5 % (ref 36.0–46.0)
Hemoglobin: 11.7 g/dL — ABNORMAL LOW (ref 12.0–15.0)
MCH: 29.2 pg (ref 26.0–34.0)
MCHC: 32.1 g/dL (ref 30.0–36.0)
MCV: 91 fL (ref 80.0–100.0)
Platelets: 199 10*3/uL (ref 150–400)
RBC: 4.01 MIL/uL (ref 3.87–5.11)
RDW: 14 % (ref 11.5–15.5)
WBC: 9.2 10*3/uL (ref 4.0–10.5)
nRBC: 0 % (ref 0.0–0.2)

## 2022-07-08 LAB — PROTEIN / CREATININE RATIO, URINE
Creatinine, Urine: 229 mg/dL
Protein Creatinine Ratio: 0.03 mg/mg{Cre} (ref 0.00–0.15)
Total Protein, Urine: 8 mg/dL

## 2022-07-08 LAB — COMPREHENSIVE METABOLIC PANEL
ALT: 18 U/L (ref 0–44)
ALT: 20 U/L (ref 0–44)
AST: 19 U/L (ref 15–41)
AST: 25 U/L (ref 15–41)
Albumin: 2.7 g/dL — ABNORMAL LOW (ref 3.5–5.0)
Albumin: 3 g/dL — ABNORMAL LOW (ref 3.5–5.0)
Alkaline Phosphatase: 96 U/L (ref 38–126)
Alkaline Phosphatase: 105 U/L (ref 38–126)
Anion gap: 8 (ref 5–15)
Anion gap: 8 (ref 5–15)
BUN: 5 mg/dL — ABNORMAL LOW (ref 6–20)
BUN: <5 mg/dL — ABNORMAL LOW (ref 6–20)
CO2: 23 mmol/L (ref 22–32)
CO2: 20 mmol/L — ABNORMAL LOW (ref 22–32)
Calcium: 9.4 mg/dL (ref 8.9–10.3)
Calcium: 8.6 mg/dL — ABNORMAL LOW (ref 8.9–10.3)
Chloride: 106 mmol/L (ref 98–111)
Chloride: 109 mmol/L (ref 98–111)
Creatinine, Ser: 0.49 mg/dL (ref 0.44–1.00)
Creatinine, Ser: 0.62 mg/dL (ref 0.44–1.00)
GFR, Estimated: >60 mL/min (ref >60)
GFR, Estimated: >60 mL/min (ref >60)
Glucose, Bld: 88 mg/dL (ref 70–99)
Glucose, Bld: 147 mg/dL — ABNORMAL HIGH (ref 70–99)
Potassium: 3.5 mmol/L (ref 3.5–5.1)
Potassium: 3.8 mmol/L (ref 3.5–5.1)
Sodium: 137 mmol/L (ref 135–145)
Sodium: 137 mmol/L (ref 135–145)
Total Bilirubin: 0.1 mg/dL — ABNORMAL LOW (ref 0.3–1.2)
Total Bilirubin: 0.3 mg/dL (ref 0.3–1.2)
Total Protein: 6.1 g/dL — ABNORMAL LOW (ref 6.5–8.1)
Total Protein: 6.9 g/dL (ref 6.5–8.1)

## 2022-07-08 LAB — TYPE AND SCREEN
ABO/RH(D): O POS
Antibody Screen: NEGATIVE

## 2022-07-08 LAB — MAGNESIUM: Magnesium: 3.7 mg/dL — ABNORMAL HIGH (ref 1.7–2.4)

## 2022-07-08 MED ORDER — NIFEDIPINE ER OSMOTIC RELEASE 30 MG PO TB24
30.0000 mg | ORAL_TABLET | Freq: Two times a day (BID) | ORAL | Status: DC
  Administered 2022-07-08 – 2022-07-10 (×5): 30 mg via ORAL
  Filled 2022-07-08 (×5): qty 1

## 2022-07-08 MED ORDER — HYDROXYZINE HCL 50 MG PO TABS
25.0000 mg | ORAL_TABLET | Freq: Four times a day (QID) | ORAL | Status: DC | PRN
  Administered 2022-07-08: 25 mg via ORAL
  Filled 2022-07-08: qty 1

## 2022-07-08 MED ORDER — ACETAMINOPHEN 500 MG PO TABS
1000.0000 mg | ORAL_TABLET | Freq: Once | ORAL | Status: AC
  Administered 2022-07-08: 1000 mg via ORAL
  Filled 2022-07-08: qty 2

## 2022-07-08 MED ORDER — ACETAMINOPHEN 500 MG PO TABS
1000.0000 mg | ORAL_TABLET | Freq: Four times a day (QID) | ORAL | Status: DC
  Administered 2022-07-08 – 2022-07-10 (×7): 1000 mg via ORAL
  Filled 2022-07-08 (×7): qty 2

## 2022-07-08 MED ORDER — PRENATAL MULTIVITAMIN CH
1.0000 | ORAL_TABLET | Freq: Every day | ORAL | Status: DC
  Administered 2022-07-08 – 2022-07-10 (×2): 1 via ORAL
  Filled 2022-07-08 (×3): qty 1

## 2022-07-08 MED ORDER — LABETALOL HCL 5 MG/ML IV SOLN
40.0000 mg | INTRAVENOUS | Status: DC | PRN
  Administered 2022-07-08: 40 mg via INTRAVENOUS
  Filled 2022-07-08: qty 8

## 2022-07-08 MED ORDER — CALCIUM CARBONATE ANTACID 500 MG PO CHEW: 2.0000 | CHEWABLE_TABLET | ORAL | Status: DC | PRN

## 2022-07-08 MED ORDER — SODIUM CHLORIDE 0.9 % IV SOLN
25.0000 mg | Freq: Four times a day (QID) | INTRAVENOUS | Status: DC | PRN
  Administered 2022-07-08 – 2022-07-10 (×2): 25 mg via INTRAVENOUS
  Filled 2022-07-08 (×2): qty 1

## 2022-07-08 MED ORDER — FAMOTIDINE IN NACL 20-0.9 MG/50ML-% IV SOLN
20.0000 mg | Freq: Once | INTRAVENOUS | Status: AC
  Administered 2022-07-08: 20 mg via INTRAVENOUS
  Filled 2022-07-08: qty 50

## 2022-07-08 MED ORDER — PANTOPRAZOLE SODIUM 40 MG PO TBEC
40.0000 mg | DELAYED_RELEASE_TABLET | Freq: Two times a day (BID) | ORAL | Status: DC
  Administered 2022-07-08 – 2022-07-10 (×6): 40 mg via ORAL
  Filled 2022-07-08 (×6): qty 1

## 2022-07-08 MED ORDER — LABETALOL HCL 5 MG/ML IV SOLN
20.0000 mg | INTRAVENOUS | Status: DC | PRN
  Administered 2022-07-10: 20 mg via INTRAVENOUS
  Filled 2022-07-08: qty 4

## 2022-07-08 MED ORDER — ACETAMINOPHEN-CAFFEINE 500-65 MG PO TABS
2.0000 | ORAL_TABLET | Freq: Once | ORAL | Status: AC
  Administered 2022-07-08: 2 via ORAL
  Filled 2022-07-08: qty 2

## 2022-07-08 MED ORDER — CLONAZEPAM 1 MG PO TABS
1.0000 mg | ORAL_TABLET | Freq: Three times a day (TID) | ORAL | Status: DC | PRN
  Administered 2022-07-09: 1 mg via ORAL
  Filled 2022-07-08: qty 1

## 2022-07-08 MED ORDER — HYDRALAZINE HCL 20 MG/ML IJ SOLN: 10.0000 mg | INTRAMUSCULAR | Status: DC | PRN

## 2022-07-08 MED ORDER — BUTALBITAL-APAP-CAFFEINE 50-325-40 MG PO TABS
1.0000 | ORAL_TABLET | Freq: Four times a day (QID) | ORAL | Status: DC | PRN
  Administered 2022-07-08: 1 via ORAL
  Filled 2022-07-08: qty 1

## 2022-07-08 MED ORDER — SODIUM CHLORIDE 0.9 % IV SOLN
12.5000 mg | Freq: Once | INTRAVENOUS | Status: AC
  Administered 2022-07-08: 12.5 mg via INTRAVENOUS
  Filled 2022-07-08: qty 12.5

## 2022-07-08 MED ORDER — DEXAMETHASONE SODIUM PHOSPHATE 10 MG/ML IJ SOLN
8.0000 mg | Freq: Once | INTRAMUSCULAR | Status: AC
  Administered 2022-07-08: 8 mg via INTRAVENOUS
  Filled 2022-07-08: qty 1

## 2022-07-08 MED ORDER — ALBUTEROL SULFATE (2.5 MG/3ML) 0.083% IN NEBU
2.5000 mg | INHALATION_SOLUTION | Freq: Four times a day (QID) | RESPIRATORY_TRACT | Status: DC | PRN
  Administered 2022-07-10: 2.5 mg via RESPIRATORY_TRACT

## 2022-07-08 MED ORDER — PROMETHAZINE HCL 25 MG PO TABS: 25.0000 mg | ORAL_TABLET | Freq: Four times a day (QID) | ORAL | Status: DC | PRN

## 2022-07-08 MED ORDER — LACTATED RINGERS IV SOLN: INTRAVENOUS | Status: DC

## 2022-07-08 MED ORDER — BETAMETHASONE SOD PHOS & ACET 6 (3-3) MG/ML IJ SUSP
12.0000 mg | INTRAMUSCULAR | Status: AC
  Administered 2022-07-08 – 2022-07-09 (×2): 12 mg via INTRAMUSCULAR
  Filled 2022-07-08 (×2): qty 5

## 2022-07-08 MED ORDER — LACTATED RINGERS IV SOLN: 125.0000 mL/h | INTRAVENOUS | Status: AC

## 2022-07-08 MED ORDER — ONDANSETRON HCL 4 MG/2ML IJ SOLN
4.0000 mg | Freq: Once | INTRAMUSCULAR | Status: AC
  Administered 2022-07-08: 4 mg via INTRAVENOUS
  Filled 2022-07-08: qty 2

## 2022-07-08 MED ORDER — NIFEDIPINE ER OSMOTIC RELEASE 30 MG PO TB24
30.0000 mg | ORAL_TABLET | Freq: Every day | ORAL | Status: DC
  Administered 2022-07-08: 30 mg via ORAL
  Filled 2022-07-08: qty 1

## 2022-07-08 MED ORDER — LABETALOL HCL 5 MG/ML IV SOLN: 80.0000 mg | INTRAVENOUS | Status: DC | PRN

## 2022-07-08 MED ORDER — LACTATED RINGERS IV SOLN: Freq: Once | INTRAVENOUS | Status: AC

## 2022-07-08 MED ORDER — CYCLOBENZAPRINE HCL 5 MG PO TABS
10.0000 mg | ORAL_TABLET | Freq: Once | ORAL | Status: AC
  Administered 2022-07-08: 10 mg via ORAL
  Filled 2022-07-08: qty 2

## 2022-07-08 MED ORDER — ARIPIPRAZOLE 10 MG PO TABS
20.0000 mg | ORAL_TABLET | Freq: Every day | ORAL | Status: DC
  Administered 2022-07-08 – 2022-07-10 (×3): 20 mg via ORAL
  Filled 2022-07-08 (×4): qty 2

## 2022-07-08 MED ORDER — LAMOTRIGINE 150 MG PO TABS
150.0000 mg | ORAL_TABLET | Freq: Every day | ORAL | Status: DC
  Administered 2022-07-08 – 2022-07-10 (×3): 150 mg via ORAL
  Filled 2022-07-08 (×4): qty 1

## 2022-07-08 MED ORDER — MAGNESIUM SULFATE 40 GM/1000ML IV SOLN
2.0000 g/h | INTRAVENOUS | Status: DC
  Administered 2022-07-08: 2 g/h via INTRAVENOUS
  Administered 2022-07-09: 3 g/h via INTRAVENOUS
  Administered 2022-07-09 – 2022-07-10 (×2): 4 g/h via INTRAVENOUS
  Administered 2022-07-10: 2 g/h via INTRAVENOUS
  Filled 2022-07-08 (×6): qty 1000

## 2022-07-08 MED ORDER — ALBUTEROL SULFATE HFA 108 (90 BASE) MCG/ACT IN AERS
2.0000 | INHALATION_SPRAY | RESPIRATORY_TRACT | Status: DC | PRN
  Administered 2022-07-08: 2 via RESPIRATORY_TRACT
  Filled 2022-07-08: qty 6.7

## 2022-07-08 MED ORDER — ACETAMINOPHEN 325 MG PO TABS: 650.0000 mg | ORAL_TABLET | ORAL | Status: DC | PRN

## 2022-07-08 MED ORDER — PROCHLORPERAZINE EDISYLATE 10 MG/2ML IJ SOLN
10.0000 mg | Freq: Four times a day (QID) | INTRAMUSCULAR | Status: DC | PRN
  Administered 2022-07-08 – 2022-07-09 (×4): 10 mg via INTRAVENOUS
  Filled 2022-07-08 (×5): qty 2

## 2022-07-08 MED ORDER — FERROUS SULFATE 325 (65 FE) MG PO TABS
325.0000 mg | ORAL_TABLET | Freq: Every day | ORAL | Status: DC
  Administered 2022-07-09 – 2022-07-10 (×2): 325 mg via ORAL
  Filled 2022-07-08 (×2): qty 1

## 2022-07-08 MED ORDER — DOCUSATE SODIUM 100 MG PO CAPS
100.0000 mg | ORAL_CAPSULE | Freq: Every day | ORAL | Status: DC
  Administered 2022-07-09 – 2022-07-10 (×2): 100 mg via ORAL
  Filled 2022-07-08 (×2): qty 1

## 2022-07-08 MED ORDER — MAGNESIUM SULFATE BOLUS VIA INFUSION
4.0000 g | Freq: Once | INTRAVENOUS | Status: AC
  Administered 2022-07-08: 4 g via INTRAVENOUS
  Filled 2022-07-08: qty 1000

## 2022-07-08 MED ORDER — DOCUSATE SODIUM 100 MG PO CAPS: 100.0000 mg | ORAL_CAPSULE | Freq: Every day | ORAL | Status: DC

## 2022-07-08 MED ORDER — SUCRALFATE 1 G PO TABS
1.0000 g | ORAL_TABLET | Freq: Three times a day (TID) | ORAL | Status: DC
  Administered 2022-07-08 – 2022-07-10 (×8): 1 g via ORAL
  Filled 2022-07-08 (×14): qty 1

## 2022-07-08 MED ORDER — LABETALOL HCL 5 MG/ML IV SOLN
INTRAVENOUS | Status: AC
  Administered 2022-07-08: 20 mg via INTRAVENOUS
  Filled 2022-07-08: qty 4

## 2022-07-08 NOTE — Consult Note (Addendum)
MFM Consult Note Patient Name: Brianna Booth  Patient MRN:   [id number]  Referring provider: Dr. Juliene Pina   Reason for Consult: Chronic hypertension with superimposed preeclampsia  HPI: Brianna Booth is a 36 y.o. [id number] at [redacted]w[redacted]d admitted for severe range blood pressures and a headache.  The patient was admitted for elevated blood pressure, nausea vomiting and right upper quadrant pain.  She had severe range blood pressure requiring labetalol x 2 rounds.  The highest recorded blood pressure I can see is 180/100.  After she received antihypertensive therapy her blood pressure became in the high normal range.  She was admitted due to concern for chronic hypertension with superimposed preeclampsia.  Her labs have been normal so far.  She reports a persistent frontal bilateral headache that is sharp.  She denies visual disturbances.  She reports increasing chest discomfort and some shortness of breath.  She reports no history of headaches prior to this time.  The patient also has a history of schizoaffective disorder and is on aripiprazole lamotrigine and clonazepam.  Her sister is very helpful and helps navigate some of her medical care.  She is with this via phone call during this visit per the patient's request.  The patient also has a history of right upper quadrant pain and vomiting prior to arriving to the hospital.  She had a gallbladder ultrasound that was normal-appearing.  Review of Systems: A review of systems was performed and was negative except per HPI   Past Obstetrical History:  OB History  Gravida Para Term Preterm AB Living  14 8 3 5 5 8   SAB IAB Ectopic Multiple Live Births  3 2   0 8    # Outcome Date GA Lbr Len/2nd Weight Sex Delivery Anes PTL Lv  14 Current           13 Preterm 07/04/18 [redacted]w[redacted]d / 00:03 2980 g F Vag-Spont EPI  LIV     Birth Comments: WNL  12 Preterm 05/26/17    M Vag-Spont   LIV  11 Preterm 02/26/14    F Vag-Spont   LIV  10 Term 09/15/11    M Vag-Spont   LIV   9 Preterm 11/13/08    F Vag-Spont  Y LIV  8 Term 05/09/07 [redacted]w[redacted]d   M Vag-Spont  N LIV  7 Term 05/22/05 [redacted]w[redacted]d   F Vag-Spont  N LIV  6 Preterm 07/19/03 [redacted]w[redacted]d   F Vag-Spont  Y LIV  5 IAB           4 IAB           3 SAB           2 SAB           1 SAB              Past Gynecologic History:  Not discussed  Past Medical History:  Past Medical History:  Diagnosis Date   Anxiety    Asthma    Bipolar 1 disorder (HCC)    Depression    Essential hypertension 02/22/2019   Headache    Preterm labor    Schizo-affective schizophrenia (HCC)        Past Surgical History:    Past Surgical History:  Procedure Laterality Date   BACK SURGERY     DILATION AND CURETTAGE OF UTERUS       Family History:   family history includes Asthma in her maternal grandfather; Diabetes in her brother and maternal aunt; Hypertension in  her father.    Social History:   Social History   Socioeconomic History   Marital status: Single    Spouse name: Not on file   Number of children: 7   Years of education: 11th grade   Highest education level: 11th grade  Occupational History   Occupation: Unemployed  Tobacco Use   Smoking status: Some Days    Types: Cigarettes    Last attempt to quit: 12/12/2017    Years since quitting: 4.5   Smokeless tobacco: Never  Vaping Use   Vaping Use: Never used  Substance and Sexual Activity   Alcohol use: Not Currently   Drug use: Yes    Types: Marijuana    Comment: last use may 2023   Sexual activity: Yes    Birth control/protection: None  Other Topics Concern   Not on file  Social History Narrative   Not on file   Social Determinants of Health   Financial Resource Strain: Not on file  Food Insecurity: Not on file  Transportation Needs: Not on file  Physical Activity: Not on file  Stress: Not on file  Social Connections: Not on file  Intimate Partner Violence: Not on file      Home Medications:   No current facility-administered medications on  file prior to encounter.   Current Outpatient Medications on File Prior to Encounter  Medication Sig Dispense Refill   albuterol (PROVENTIL HFA;VENTOLIN HFA) 108 (90 Base) MCG/ACT inhaler Inhale 1-2 puffs into the lungs every 6 (six) hours as needed for wheezing or shortness of breath.     cyclobenzaprine (FLEXERIL) 10 MG tablet Take 1 tablet (10 mg total) by mouth 3 (three) times daily as needed for muscle spasms. 15 tablet 0   Prenatal Multivit-Min-Fe-FA (PRENATAL VITAMINS) 0.8 MG tablet Take 1 tablet by mouth daily. 30 tablet 12   promethazine (PHENERGAN) 25 MG tablet Take 1 tablet (25 mg total) by mouth every 6 (six) hours as needed for nausea or vomiting. 30 tablet 0      Allergies:   No Known Allergies   Physical Exam:      07/08/2022   12:11 PM 07/08/2022    9:42 AM 07/08/2022    8:03 AM  Vitals with BMI  Systolic 155 159 578  Diastolic 97 90 79  Pulse 94 79 77    Sitting comfortably on the sonogram table Nonlabored breathing Unilateral bells palsy noted Normal gross and motor sensation Normal rate and rhythm Abdomen is nontender  Assessment  Brianna Booth is a 36 y.o. [id number] at [redacted]w[redacted]d with pregnancy complicated by the following conditions:   1. CHTN with superimposed preeclampsia with severe features  -Currently the patient's labs are normal, but her blood pressure continues to be elevated given her the diagnosis of superimposed preeclampsia on chronic hypertension.  It is possible this is an exacerbation of chronic hypertension but will need more time to elucidate the picture.  Given the normal head CT it is unlikely there is a another acute intracranial process causing her headache. -I have counseled the patient about the concerns for preeclampsia with superimposed chronic hypertension including stroke, heart attack and death.  I also discussed the risk of fetal compromise due to placental abruption.  I discussed that typically we can treat blood pressure with blood  pressure medications and give magnesium for preeclampsia prophylaxis while we attempt to prolong her pregnancy safely.  However, I discussed potential need for delivery based on worsening fetal and maternal status.  The patient  verbalized understanding and is okay with the plan outlined below. -Continue IV magnesium infusion for preeclampsia prophylaxis -Laboratory assessment every 6 hours to include a CBC, CMP and magnesium level.  Goal for the magnesium level should be at least 5-7. -IV treatment with antihypertensive medications for blood pressures greater than 160 systolic or 110 diastolic -Continue to titrate Procardia to a max dose of 120 mg in 24 hours. Blood pressure goal of 120-140 systolic and 70-90 diastolic.   -Continue to treat headache with analgesics prn -Continuous fetal monitoring is indicated for severe range blood pressure, vaginal bleeding or decreased fetal movement.  As long as her blood pressure is controlled and she does not have any of these concerning symptoms NST 2-3 times a day is appropriate. -A course of betamethasone for fetal lung maturity has been started -CT scan of the head has been negative for intracranial hemorrhage or other acute process -Consider neuro consult if her blood pressure normalizes and her headache persists. -If the patient has any of the following delivery should be considered: Nonreassuring fetal heart tracing not responsive to intrauterine resuscitation, severe persistent maternal headache despite antihypertensive and analgesic treatment with no other identifiable cause other than preeclampsia, difficult to control blood pressure despite maximum doses of 1-2 antihypertensive medications, maternal labs consistent with preeclampsia with severe features. I discussed that due to the severity of her headache that delivery may be indicated if her headache persists despite adequate control of her blood pressure and appropriate analgesic treatment. However, due  to her gestational age of [redacted] weeks we will try to get better blood pressure control until she is steroid complete before proceeding with delivery unless headache becomes more concerning then delivery should be considered.  - NICU consult - there is currently a high NICU sensus as well.   2. Schizoaffective disorder -Currently back on her medications which are acceptable during pregnancy.  Consult psychiatry with any acute psychiatric concerns.  60 minutes was spent in total patient care including chart review, preparation and patient counseling.  Greater than 50% was performed with face-to-face patient contact.  Thank you for the opportunity to be involved with this patient's care. Please let us know if we can be of any further assistance.   Code: 47829  [name]  MFM, [place]   07/08/2022  12:13 PM

## 2022-07-08 NOTE — H&P (Addendum)
Brianna Booth is a 36 y.o. female G14, P8 (3 term, 5 preterm, 5 SABs) presenting for headache and RUQ pain and vomiting and noted blood in vomit.  32 weeks  Pt presented with HA and didn't resolve with meds in MAU and BPs in severe range needing Labetalol x 2 rounds, so decision was made for admission for [facility] superimposed severe range BPs/ Admitting for magnesium sulphate and medical management for severe PEC.  Labs reviewed nl, incl p/c ratio RUQ sono wnl CXR wnl  Ob sono- 12/19-  EFW 4'7" 65% AC 92% AFI 11.6 cm. Vx.   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, Obesity, CTHN (was not on meds), H/o PEC with 36 wk IOL, Bipolar and Schizoaffective disorder, Severe anxiety (class C meds), stable Asthma   OB History     Gravida  41   Para  8   Term  3   Preterm  5   AB  5   Living  8      SAB  3   IAB  2   Ectopic      Multiple  0   Live Births  8          Past Medical History:  Diagnosis Date   Anxiety    Asthma    Bipolar 1 disorder ([place])    Depression    Essential hypertension 02/22/2019   Headache    Preterm labor    Schizo-affective schizophrenia ([place])    Past Surgical History:  Procedure Laterality Date   BACK SURGERY     DILATION AND CURETTAGE OF UTERUS     Family History: family history includes Asthma in her maternal grandfather; Diabetes in her brother and maternal aunt; Hypertension in her father. Social History:  reports that she has been smoking cigarettes. She has never used smokeless tobacco. She reports that she does not currently use alcohol. She reports current drug use. Drug: Marijuana.     Maternal Diabetes: No Genetic Screening: Normal Maternal Ultrasounds/Referrals: Normal Fetal Ultrasounds or other Referrals:  None Maternal Substance Abuse:  Yes:  Type: Marijuana Significant Maternal Medications:  Meds include: Other:  Significant Maternal Lab Results:  None Number of Prenatal Visits:greater than 3  verified prenatal visits Other Comments:   grand-multiparity   Review of Systems History   Blood pressure (!) 147/92, pulse 73, temperature 98 F (36.7 C), temperature source Oral, resp. rate 17, height 5\' 7"  (1.702 m), weight 119.7 kg, last menstrual period 11/26/2021, SpO2 99 %, unknown if currently breastfeeding. Patient Vitals for the past 24 hrs:  BP Temp Temp src Pulse Resp SpO2 Height Weight  07/08/22 0704 (!) 147/92 -- -- 73 17 -- -- --  07/08/22 0558 (!) 152/91 -- -- 73 18 -- -- --  07/08/22 0500 (!) 151/87 -- -- 78 17 -- -- --  07/08/22 0359 (!) 155/94 -- -- 80 16 -- -- --  07/08/22 0258 (!) 148/81 -- -- 80 16 -- -- --  07/08/22 0231 (!) 144/83 98 F (36.7 C) Oral 78 20 -- -- --  07/08/22 0145 (!) 156/89 -- -- 78 -- 99 % -- --  07/08/22 0130 (!) 147/77 -- -- 81 -- 99 % -- --  07/08/22 0125 -- -- -- -- -- 98 % -- --  07/08/22 0120 -- -- -- -- -- 99 % -- --  07/08/22 0115 (!) 169/83 -- -- 75 -- 99 % -- --  07/08/22 0110 -- -- -- -- -- 99 % -- --  07/08/22 0105 -- -- -- -- -- 99 % -- --  07/08/22 0101 (!) 159/95 -- -- 80 -- -- -- --  07/08/22 0100 -- -- -- -- -- 99 % -- --  07/08/22 0035 (!) 157/87 -- -- 77 -- -- -- --  07/08/22 0010 (!) 168/98 -- -- 82 -- -- -- --  07/07/22 2350 (!) 180/100 -- -- 82 -- -- -- --  07/07/22 2348 (!) 174/90 -- -- 82 -- -- -- --  07/07/22 2346 (!) 160/95 -- -- 80 -- -- -- --  07/07/22 2338 135/67 -- -- 77 -- -- -- --  07/07/22 2312 (!) 144/98 -- -- -- -- -- -- --  07/07/22 2302 -- 98.1 F (36.7 C) -- 80 20 98 % 5\' 7"  (1.702 m) 119.7 kg    Exam Physical Exam  A&O x 3, no acute distress. Pleasant HEENT neg, no thyromegaly Lungs CTA bilat CV RRR, S1S2 normal Abdo soft, non tender, non acute Extr no edema/ tenderness Pelvic deferred  FHT  130s mod variability, + accels no decels reactive NST Toco none   Prenatal labs: ABO, Rh:  O (+) Antibody:  neg Rubella:  Imm RPR:   NR HBsAg:   Neg HepC neg HIV:   Neg GBS:   Not done NIPT  low risk AFP1 neg Glucola nl   Assessment/Plan: 36 yo G14, [id number] at 32 wks with CHTN w/ superimposed severe range BPs  1) CHTN/ superimposed Severe PEC, Magnesium sulphate x 24 hrs, Procardia PO 30mg  XL, daily weights, strict I/O  HA management, head CT if not better with BP control  2) RUQ pain and vomiting- GB sono nl, GERD, Phenergan prn, add Pantoprazole  3) Left side body pain- heating pad, comfort care  4) Bipolar/ Schizoaffective disorder- resume all meds - Aripiprazole, Lamotrigine daily, Clonazepam prn 5) severe anxiety- resume meds 6) Asthma - cont meds 7) Obesity, modified bedrest- SCDs, will discuss low dose heparin for VTE prophylaxis 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

## 2022-07-08 NOTE — Progress Notes (Signed)
Pt's history, labs, vitals reviewed.   Discussed case with Dr. Bryn Gulling of MFM. Dr. Juliene Pina earlier today and several calls through afternoon with nursing.  Pt slept after returning from head CT (normal results) but awoke with HA. Nursing called to alert me, bps 150s/90s and 1,000mg  tylenol ordered. Now 1 hour later pt reports resolution of HA to nurse (though still says 7/10 despite saying HA resolved) and nurse reports pt is dozing off and on.   Case d/w MFM. Concern given persistent neuro sx in the setting of a pt with high but not severe range bps. Can continue to work on oral meds to bring bp down, reserve IV push meds for severe range bps (and put baby on monitor). If HA persisting, move to delivery, even if steroids not complete. Check Mag level and labs for pre-eclampsia more frequently. Consider neuro consult though most likely cause of HA is PEC.   Given that pt is resting now, HA resolved, will repeat labs, increase her procardia and continue with expectant management.   Lendon Colonel 07/08/2022 4:52 PM

## 2022-07-08 NOTE — Progress Notes (Addendum)
S: HA not going away. Fioricet this morning didn't help. Overnight Flexeril and Excedrin migraine didn't help much either.  HA now on right (was on left at admission) and worse HA of her life. No scotomas. H/o Bell's Palsy in right. Says eyelid is more drooping now. Vomited blood after cough. Anxiety and a lot of stress off late (from kids, has 8 kids) .Per FOB, her face is not any worse. Denies weakness in limbs but is tired (magnesium) Good FMs, no LOF/ UCs/ VB   O: BP (!) 159/90   Pulse 79   Temp 98 F (36.7 C) (Oral)   Resp 18   Ht 5\' 7"  (1.702 m)   Wt 119.7 kg   LMP 11/26/2021   SpO2 100%   BMI 41.35 kg/m  Sitting up trying to eat. NAD Lungs CTA  CV RRR Abdo gravid soft Extr no c/c/e Neuro exam- b/l good motor 5/5 UE and LE. DTR +1/+1. Face- slight droop of right eyelid and face is not new (since age 36)   NST 120-125/ + accels no decels mod variability, reactive  Toco none     Latest Ref Rng & Units 07/08/2022   12:09 AM 04/16/2022   12:27 AM 04/11/2022    3:10 PM  CBC  WBC 4.0 - 10.5 K/uL 9.2  9.2  9.8   Hemoglobin 12.0 - 15.0 g/dL 04/13/2022  70.1  77.9   Hematocrit 36.0 - 46.0 % 36.5  33.3  36.8   Platelets 150 - 400 K/uL 199  164  168        Latest Ref Rng & Units 07/08/2022   12:09 AM 04/16/2022   12:27 AM 04/11/2022    3:10 PM  CMP  Glucose 70 - 99 mg/dL 88  04/13/2022  76   BUN 6 - 20 mg/dL 5  5  5    Creatinine 0.44 - 1.00 mg/dL 300   9.23   Sodium 135 - 145 mmol/L 137  135  135   Potassium 3.5 - 5.1 mmol/L 3.5  3.8  3.8   Chloride 98 - 111 mmol/L 106  107  106   CO2 22 - 32 mmol/L 23  19  21    Calcium 8.9 - 10.3 mg/dL 9.4  9.0  9.3   Total Protein 6.5 - 8.1 g/dL 6.1  6.3  6.8   Total Bilirubin 0.3 - 1.2 mg/dL 0.1  0.4  0.5   Alkaline Phos 38 - 126 U/L 96  51  46   AST 15 - 41 U/L 19  18  18    ALT 0 - 44 U/L 18  22  23      Urine p/c 0.03   A/P: 32 wks, HD #1, CHTN, superimposed PEC 1) CHTN- superimposed PEC-- iv Labetalol x 2 doses in MAU, no severe range  BPs since. Added Procardia 30mg  XL daily, labs WNL, repeat in 3 days. 24hr urine protein collection , Magnesium x24 hours 2) Headache- right orbital (was left, now on right) -sp Flexeril, Excedrin, Fioricet. Adding Compazine+ Decadron IV this morning. If not better, needs head CT. Pt declines due to phobia in machine. Offered Clonazepam for CT 3) N/V/RUQ pain- nl LFTs, nl RUQ sono, no gall stones. Phenergan prn, added Pantoprazole and Carafate today, repeat CMP 3 days  4) Bipolar/Schizoaffective - resume Aripiprazole and Lamotrigine 5) Anxiety - severe, Clonazepam prn  6) Asthma - Albuterol prn 7) Prematurity- BTMZ 12/20, 12/21 in anticipation of preterm delivery. 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 Spoke w/ MFM Dr Bryn Gulling. Agrees with all incl BP control, magnesium, HA meds and CT head.  If HA not resolved with all this and BP is improved and head CT neg, he recommends to proceed with delivery for "PEC with new onset neural symptoms" regardless of gestational age or steroid completion.  Pt and MFM informed that NICU is full. Dr Bryn Gulling thinks it may be difficult to transfer this patient w/ neural symptoms to another facility. Pt agrees to start w/ head CT and shut her phone down for a bit to rest and assess if HA improved.

## 2022-07-09 ENCOUNTER — Ambulatory Visit: Payer: Medicare HMO

## 2022-07-09 ENCOUNTER — Inpatient Hospital Stay (HOSPITAL_COMMUNITY): Payer: Medicare HMO

## 2022-07-09 LAB — COMPREHENSIVE METABOLIC PANEL
ALT: 18 U/L (ref 0–44)
ALT: 20 U/L (ref 0–44)
ALT: 19 U/L (ref 0–44)
AST: 24 U/L (ref 15–41)
AST: 26 U/L (ref 15–41)
AST: 24 U/L (ref 15–41)
Albumin: 2.7 g/dL — ABNORMAL LOW (ref 3.5–5.0)
Albumin: 3 g/dL — ABNORMAL LOW (ref 3.5–5.0)
Albumin: 3 g/dL — ABNORMAL LOW (ref 3.5–5.0)
Alkaline Phosphatase: 99 U/L (ref 38–126)
Alkaline Phosphatase: 97 U/L (ref 38–126)
Alkaline Phosphatase: 103 U/L (ref 38–126)
Anion gap: 9 (ref 5–15)
Anion gap: 8 (ref 5–15)
Anion gap: 7 (ref 5–15)
BUN: <5 mg/dL — ABNORMAL LOW (ref 6–20)
BUN: <5 mg/dL — ABNORMAL LOW (ref 6–20)
BUN: <5 mg/dL — ABNORMAL LOW (ref 6–20)
CO2: 21 mmol/L — ABNORMAL LOW (ref 22–32)
CO2: 19 mmol/L — ABNORMAL LOW (ref 22–32)
CO2: 20 mmol/L — ABNORMAL LOW (ref 22–32)
Calcium: 8 mg/dL — ABNORMAL LOW (ref 8.9–10.3)
Calcium: 7.5 mg/dL — ABNORMAL LOW (ref 8.9–10.3)
Calcium: 7.5 mg/dL — ABNORMAL LOW (ref 8.9–10.3)
Chloride: 107 mmol/L (ref 98–111)
Chloride: 108 mmol/L (ref 98–111)
Chloride: 110 mmol/L (ref 98–111)
Creatinine, Ser: 0.55 mg/dL (ref 0.44–1.00)
Creatinine, Ser: 0.49 mg/dL (ref 0.44–1.00)
Creatinine, Ser: 0.46 mg/dL (ref 0.44–1.00)
GFR, Estimated: >60 mL/min (ref >60)
GFR, Estimated: >60 mL/min (ref >60)
GFR, Estimated: >60 mL/min (ref >60)
Glucose, Bld: 126 mg/dL — ABNORMAL HIGH (ref 70–99)
Glucose, Bld: 123 mg/dL — ABNORMAL HIGH (ref 70–99)
Glucose, Bld: 128 mg/dL — ABNORMAL HIGH (ref 70–99)
Potassium: 4.1 mmol/L (ref 3.5–5.1)
Potassium: 3.9 mmol/L (ref 3.5–5.1)
Potassium: 4.3 mmol/L (ref 3.5–5.1)
Sodium: 137 mmol/L (ref 135–145)
Sodium: 135 mmol/L (ref 135–145)
Sodium: 137 mmol/L (ref 135–145)
Total Bilirubin: 0.1 mg/dL — ABNORMAL LOW (ref 0.3–1.2)
Total Bilirubin: 0.3 mg/dL (ref 0.3–1.2)
Total Bilirubin: <0.1 mg/dL — ABNORMAL LOW (ref 0.3–1.2)
Total Protein: 6.4 g/dL — ABNORMAL LOW (ref 6.5–8.1)
Total Protein: 6.9 g/dL (ref 6.5–8.1)
Total Protein: 6.7 g/dL (ref 6.5–8.1)

## 2022-07-09 LAB — CBC
HCT: 34.6 % — ABNORMAL LOW (ref 36.0–46.0)
HCT: 35.6 % — ABNORMAL LOW (ref 36.0–46.0)
Hemoglobin: 11.7 g/dL — ABNORMAL LOW (ref 12.0–15.0)
Hemoglobin: 12 g/dL (ref 12.0–15.0)
MCH: 29.8 pg (ref 26.0–34.0)
MCH: 29.8 pg (ref 26.0–34.0)
MCHC: 33.8 g/dL (ref 30.0–36.0)
MCHC: 33.7 g/dL (ref 30.0–36.0)
MCV: 88 fL (ref 80.0–100.0)
MCV: 88.3 fL (ref 80.0–100.0)
Platelets: 232 10*3/uL (ref 150–400)
Platelets: 255 10*3/uL (ref 150–400)
RBC: 3.93 MIL/uL (ref 3.87–5.11)
RBC: 4.03 MIL/uL (ref 3.87–5.11)
RDW: 14.3 % (ref 11.5–15.5)
RDW: 14.6 % (ref 11.5–15.5)
WBC: 14.4 10*3/uL — ABNORMAL HIGH (ref 4.0–10.5)
WBC: 15.2 10*3/uL — ABNORMAL HIGH (ref 4.0–10.5)
nRBC: 0 % (ref 0.0–0.2)
nRBC: 0 % (ref 0.0–0.2)

## 2022-07-09 LAB — PROTEIN, URINE, 24 HOUR
Collection Interval-UPROT: 24 hours
Protein, Urine: <6 mg/dL
Urine Total Volume-UPROT: 5100 mL

## 2022-07-09 LAB — MAGNESIUM
Magnesium: 4.3 mg/dL — ABNORMAL HIGH (ref 1.7–2.4)
Magnesium: 5.4 mg/dL — ABNORMAL HIGH (ref 1.7–2.4)
Magnesium: 5.1 mg/dL — ABNORMAL HIGH (ref 1.7–2.4)
Magnesium: 4.8 mg/dL — ABNORMAL HIGH (ref 1.7–2.4)

## 2022-07-09 MED ORDER — PROCHLORPERAZINE EDISYLATE 10 MG/2ML IJ SOLN
10.0000 mg | Freq: Four times a day (QID) | INTRAMUSCULAR | Status: DC
  Administered 2022-07-10 (×3): 10 mg via INTRAVENOUS
  Filled 2022-07-09 (×5): qty 2

## 2022-07-09 MED ORDER — LORAZEPAM 2 MG/ML IJ SOLN
1.0000 mg | Freq: Once | INTRAMUSCULAR | Status: AC
  Administered 2022-07-09: 1 mg via INTRAVENOUS
  Filled 2022-07-09: qty 1

## 2022-07-09 MED ORDER — OXYCODONE HCL 5 MG PO TABS
5.0000 mg | ORAL_TABLET | Freq: Once | ORAL | Status: AC
  Administered 2022-07-09: 5 mg via ORAL
  Filled 2022-07-09: qty 1

## 2022-07-09 MED ORDER — OXYCODONE HCL 5 MG PO TABS
5.0000 mg | ORAL_TABLET | ORAL | Status: DC | PRN
  Administered 2022-07-09 – 2022-07-10 (×3): 5 mg via ORAL
  Filled 2022-07-09 (×3): qty 1

## 2022-07-09 MED ORDER — CYCLOBENZAPRINE HCL 10 MG PO TABS
5.0000 mg | ORAL_TABLET | Freq: Three times a day (TID) | ORAL | Status: DC | PRN
  Administered 2022-07-09: 5 mg via ORAL
  Filled 2022-07-09: qty 1

## 2022-07-09 MED ORDER — LORAZEPAM BOLUS VIA INFUSION: 1.0000 mg | Freq: Once | INTRAVENOUS | Status: DC

## 2022-07-09 MED ORDER — GUAIFENESIN ER 600 MG PO TB12
600.0000 mg | ORAL_TABLET | Freq: Two times a day (BID) | ORAL | Status: DC | PRN
  Administered 2022-07-09: 600 mg via ORAL
  Filled 2022-07-09 (×2): qty 1

## 2022-07-09 MED ORDER — LORAZEPAM 2 MG/ML IJ SOLN
1.0000 mg | Freq: Once | INTRAMUSCULAR | Status: DC
  Filled 2022-07-09: qty 1

## 2022-07-09 MED ORDER — HYDROMORPHONE HCL 1 MG/ML IJ SOLN
0.5000 mg | INTRAMUSCULAR | Status: DC | PRN
  Administered 2022-07-09: 0.5 mg via INTRAVENOUS
  Filled 2022-07-09: qty 0.5

## 2022-07-09 NOTE — Progress Notes (Addendum)
Attempted to get patient to MRI X2.  The patient has jewelry on that the patient states "won't come out". Pt has several piercing's on face. Patient also has a necklace that she cannot remove. Pt with long fingernails with jewels on the nails. Contact with MRI and they stated that she would not be able to get the scan done. Patient states that she would rather not have it done.    Carmelina Dane, RN

## 2022-07-09 NOTE — Progress Notes (Signed)
Per RN, patient threw up immediately after taking Klonopin and Oxycodone. New order placed for IV Dilaudid and IV Ativan to try instead.

## 2022-07-09 NOTE — Progress Notes (Signed)
Hospital day #3 Chronic hypertension with initial elevated blood pressures now controlled on oral Procardia Persistent headache  History of present illness 36 year old G14 [id number] at 32 weeks and 1 day who initially presented with vomiting coughing and headache.  Patient with history of chronic hypertension which has been well-controlled on no medications throughout the pregnancy until presentation to MAU where patient had severe range blood pressures treated with IV labetalol and now blood pressure is well-controlled with 60 mg oral Procardia, split 30am/ 30 pm.  Patient was started on magnesium shortly after admission and magnesium levels are only getting therapeutic this morning patient does note some blurry vision starting this morning.  While patient presented with nausea and emesis this has continued throughout the pregnancy and patient has been tolerating diet since admission.  She does need intermittent Reglan.  Patient's biggest concern is her headache.  This started on admission.  Patient notes no history of chronic headache.  Initially patient was treated with Compazine, Decadron, Flexeril, Excedrin, Phenergan, Fioricet.  Throughout her stay patient continues to rate her headache as 7-10 out of 10.  Yesterday she was started on 1000 mg acetaminophen every 6 hours.  This morning she did report a headache upon waking of 10 out of 10 and received 5 mg of oxycodone.  Despite patient's high headache scores,  since yesterday afternoon on every check by me or the nurse patient has been sleeping.  Patient did have a noncontrast head CT yesterday which was negative for acute findings.  Her urine drug screen came back positive for barbiturates though this was after she received Fioricet.  Patient notes her right sided Bell's palsy seems to be getting worse.  This morning patient notes good fetal movement, no contractions, no leakage of fluid, no vaginal bleeding.  Patient does note the new onset blurry  vision and continues to complain of headache when I woke her to assess her.  Patient does state mild chest pain and mild shortness of breath.  She denies right upper quadrant pain.  Of note patient also has bipolar disorder and schizoaffective schizophrenia and has had intermittent compliance on her Lamictal and Abilify.  She has used Klonopin and hydroxyzine through the pregnancy.  She also suffers from anxiety.  Past Medical History:  Diagnosis Date   Anxiety    Asthma    Bipolar 1 disorder ([place])    Depression    Essential hypertension 02/22/2019   Headache    Preterm labor    Schizo-affective schizophrenia [facility])    Physical exam: Vitals:   07/09/22 0400 07/09/22 0733 07/09/22 0847 07/09/22 1156  BP:  (!) 149/81  (!) 144/84  Pulse:  (!) 106  (!) 101  Resp: 18 18  16   Temp:  (!) 97.2 F (36.2 C) 97.7 F (36.5 C) 97.8 F (36.6 C)  TempSrc:  Axillary Oral Oral  SpO2:  100%  100%  Weight:      Height:       General: Sleeping in bed, appears comfortable HEENT right lid lag, intact sensation Cardiovascular: Regular rate and rhythm Pulmonary: Clear to auscultation bilaterally, no wheeze or crackle Abdomen: Obese, gravid, no fundal tenderness, no right upper quadrant pain GU: Deferred Lower extremity: No edema, no clonus, 2+ DTR  Fetal assessment December 20, 11pm , 2023: NST: Baseline 130s, positive accelerations, no decelerations, 10 beat variability.  Toco: Negative July 09, 2022 10 AM.  120s baseline, positive accelerations, no decelerations, 10 beat variability, toco negative     Latest Ref  Rng & Units 07/09/2022    1:46 AM 07/08/2022    5:30 PM 07/08/2022   12:09 AM  CBC  WBC 4.0 - 10.5 K/uL 14.4  8.5  9.2   Hemoglobin 12.0 - 15.0 g/dL 11.7  12.5  11.7   Hematocrit 36.0 - 46.0 % 34.6  37.0  36.5   Platelets 150 - 400 K/uL 232  230  199        Latest Ref Rng & Units 07/09/2022    8:44 AM 07/09/2022    1:46 AM 07/08/2022    5:30 PM  CMP  Glucose 70 - 99  mg/dL 123  126  147   BUN 6 - 20 mg/dL <5  <5  <5   Creatinine 0.44 - 1.00 mg/dL 0.49  0.55  0.62   Sodium 135 - 145 mmol/L 135  137  137   Potassium 3.5 - 5.1 mmol/L 3.9  4.1  3.8   Chloride 98 - 111 mmol/L 108  107  109   CO2 22 - 32 mmol/L 19  21  20    Calcium 8.9 - 10.3 mg/dL 7.5  8.0  8.6   Total Protein 6.5 - 8.1 g/dL 6.9  6.4  6.9   Total Bilirubin 0.3 - 1.2 mg/dL 0.3  0.1  0.3   Alkaline Phos 38 - 126 U/L 97  99  105   AST 15 - 41 U/L 26  24  25    ALT 0 - 44 U/L 20  18  20     Mag 5.4  Assessment and plan: 36 year old G14 [id number] at 32 weeks and 1 day with exacerbation of her chronic hypertension, persistent headache and nausea. -Chronic hypertension.  Labs remain negative for preeclampsia.  Initial elevated blood pressures were controlled with IV push labetalol and now 60 mg of Procardia.  Blood pressures remaining in normal to mild range.  Patient currently on magnesium for preeclamptic precautions as we continue to evaluate for possible preterm delivery due to severe preeclampsia.  Currently her only symptom is headache.  We will keep her magnesium therapeutic in 5-8 range.  She was on 4 g/h overnight to get her levels therapeutic but will drop her to 3 g an hour at this point and repeat mag level at 4 PM.  Will check labs for preeclampsia twice daily.  -Prematurity.  NICU consult requested.  On magnesium.  Second betamethasone shot given this morning around 10 AM.  Patient is aware that NICU is at critical levels of staffing and should she be delivered baby may be transferred to [place]  -Headache.  Unclear if this is due to preeclampsia or some other etiology.  Head CT was negative.  Urine drug screen was negative other than barbiturates after she was given Fioricet.  Patient continues to rest despite her headache score.  She remains on 1 g Tylenol every 6 hours.  She did have a single 5 mg oxycodone this morning.  Neurology consult requested.  -Nausea and vomiting.  On Pepcid,  Zofran, Protonix, Compazine, Phenergan, and Carafate.  See MAR.  Patient had right upper quadrant ultrasound with positive sonographic Murphy sign but no other abnormal findings.  On clinical exam patient is not showing right upper quadrant tenderness.  -Anxiety, schizoaffective disorder, bipolar.  Lamictal and Abilify were restarted at admission.  Patient has order for Klonopin as needed though has not taken it since admission.  Atarax was given yesterday afternoon.   About 90 minutes were spent in care of this patient  today including evaluating patient, reviewing labs and talking to the patient and her sister, neonatology, neurology, maternal-fetal medicine.  At 1 PM care will be transferred to Dr. Mcarthur Rossetti who can be reached via text at [phone]  Lendon Colonel 07/09/2022 12:30 PM

## 2022-07-09 NOTE — Progress Notes (Signed)
Brianna Booth 36 y.o. [id number] at [redacted]w[redacted]d HD#3 admitted with cHTN SI PEC w/ SF and intractable HA  S: Patient seen and examined at bedside, FOB present at bedside. Patient laying in bed with lights off, hands over face, reporting persistent HA. Rating pain 8/10 on pain scale and has not dropped below a 7 since admission. Pain primarily frontal radiating to the back of her head and into her neck. Endorses blurry vision with some bright spots. Endorses having some epigastric and RUQ pain radiating into her chest as well. No CTXs. Good FM.  Patient has sister on FaceTime asking questions and advocating for patient as well  O: Vitals:   07/09/22 0733 07/09/22 0847 07/09/22 1156 07/09/22 1517  BP: (!) 149/81  (!) 144/84 (!) 144/96  Pulse: (!) 106  (!) 101 97  Resp: 18  16 17   Temp: (!) 97.2 F (36.2 C) 97.7 F (36.5 C) 97.8 F (36.6 C) 97.6 F (36.4 C)  TempSrc: Axillary Oral Oral Oral  SpO2: 100%  100% 100%  Weight:      Height:       Physical Exam: -General: AAO -Heart: RRR -Lungs: CTABL no rales, no crackles -Abdomen: gravid uterus -Extremities: +2 DTR LE, trace swelling     Latest Ref Rng & Units 07/09/2022    1:46 AM 07/08/2022    5:30 PM 07/08/2022   12:09 AM  CBC  WBC 4.0 - 10.5 K/uL 14.4  8.5  9.2   Hemoglobin 12.0 - 15.0 g/dL 07/10/2022  23.5  57.3   Hematocrit 36.0 - 46.0 % 34.6  37.0  36.5   Platelets 150 - 400 K/uL 232  230  199       Latest Ref Rng & Units 07/09/2022    8:44 AM 07/09/2022    1:46 AM 07/08/2022    5:30 PM  CMP  Glucose 70 - 99 mg/dL 07/10/2022  254  270   BUN 6 - 20 mg/dL <5  <5  <5   Creatinine 0.44 - 1.00 mg/dL 623  7.62  8.31   Sodium 135 - 145 mmol/L 135  137  137   Potassium 3.5 - 5.1 mmol/L 3.9  4.1  3.8   Chloride 98 - 111 mmol/L 108  107  109   CO2 22 - 32 mmol/L 19  21  20    Calcium 8.9 - 10.3 mg/dL 7.5  8.0  8.6   Total Protein 6.5 - 8.1 g/dL 6.9  6.4  6.9   Total Bilirubin 0.3 - 1.2 mg/dL 0.3  0.1  0.3   Alkaline Phos 38 - 126 U/L 97  99   105   AST 15 - 41 U/L 26  24  25    ALT 0 - 44 U/L 20  18  20     Magnesium level: 5.4 @ 1010 > 5.1 @ 15.25  A/P: 5.17 36 y.o. at [redacted]w[redacted]d HD#3 admitted with cHTN SI PEC SF, now BP stable but intractable HA   Intractable HA -Patient has had persistent HA despite regimen of PO Tylenol1g q6hr, Oxycodone 5mg , Phenergan 25mg , and Compazine 10mg  -S/p normal head CT -Neuro consult requested: recommended MRI however patient unable to have done d/t dermal piercing that she states cannot be removed -Per MFM consultation, recommend proceeding with delivery if intractable HA with normalization of BP and no other acute pathology.  cHTN SI PEC SF -BP well controlled on Procardia 30XL q12hr -PIH labs WNL -Magnesium sulfate ppx, last Mag level therapeutic at 3g/hr  Prematurity -S/p BMZ 2nd dose given this morning 10AM -NICU consult pending -Reviewed critical staffing of our NICU and likely transfer of baby, pt sister requesting phone consultation with that NICU Schizoaffective disorder -Resumed Lamictal and Abilify  Will try an additional dose of Oxycodone IR and Klonopin at this time and reassess for any improvement of HA symptoms, however per MFM recommendations, if no relief of severe HA, will proceed with delivery and plan for IOL    Wilmont Olund A Anaih Brander 07/09/22 5:09 PM

## 2022-07-09 NOTE — Consult Note (Addendum)
.  Consultation Service: Neonatology   Dr. Benjie Karvonen has asked for consultation on Brianna Booth regarding the care of a premature infant at 32+1 wga. Thank you for inviting [place] to see this patient.   Reason for consult:  Explain the possible complications, the prognosis, and the care of a premature infant at 38 and 1/7 weeks.  Chief complaint: 36 y.o. female with a single IUP with an estimated weight of 2016 grams, at 32+1 ega.  She is admitted with difficult to control HA pain, and CHTN w/ superimposed PreE.     My key findings of this patient's HPI are:  I have reviewed the patient's chart and have met with her. The salient information is as follows:   Prenatal labs: reviewed   Prenatal care:   good  Pregnancy complications:   Pregnancy has been complicated by elevated BMI, CHTN (now with superimposed preeclampsia), asthma, severe anxiety, major depressive disorder, bipolar disorder, schizoaffective disorder, and new onset severe HA which precipitated this admission.   A head CT was negative.  BP controlled with IV labatelol and now Procardia.  Currently receiving cont Mag.  Plan is for delivery if HA not controlled with BP mitigation, no other etiology outside of PreE is identified, or evidence of fetal distress.  If indicated, the plan is to proceed with IOL for vaginal delivery, as no prior h/o cs.    Maternal Medications:  - Pepcid, Zofran, Protonix, Compazine, Phenergan, and Carafate.   - Intermittently compliant with Lamictal / abilify- these have been restarted this admission    PRN Klonopin / Atarax for anxiety  - Fioricet (given prior to the Utox 12/20 w/ +barb) and oxycodone this admission for HA pain.   Maternal antibiotics: This patient's mother is not on file. Maternal Steroids: BMZ Most recent dose:  12/21 @ 1000    My recommendations for this patient and my actions included:   1. In the presence of the Brianna Booth, her husband, her sister via face time, and at  times the bedside RN, I spent ~25 minutes discussing the possible complications and outcomes of prematurity at this gestational age. I discussed specific complications at this gestational age referencing the need for resuscitation at birth, respiratory support, mechanical ventilation and surfactant administration for respiratory distress, IV fluids pending establishment of enteral feeds (encouraged breast milk feeding), antibiotics for possible sepsis, temperature support, and monitoring. I discussed this with parents in detail and they expressed an understanding of the risks and complications of prematurity.   I answered all questions.   2. I also discussed the expected survival of an infant born at 36-33 wga which is quite good, approaching 100%. We further discussed that  very few of the neonates born at this age have profound or severe neurological complications and school difficulties. However, some of the neonates born at this age will have some for of mild to moderate neurological complications. She expressed an understanding of this information.   3. I informed her that the NICU team would be present at the delivery. She agreed that all appropriate medical measures could be taken to resuscitate her infant at the delivery.   4. A visit to the NICU by the infant's mother and/or a significant other was encouraged. Visitation policy was discussed.   Final Impression:  36 y.o. female with a single IUP who is threatening to deliver and who now understands the possible complications and prognosis of her infant. The mother agrees with plan for resuscitation and ICU care. [employer]  Brianna Booth's questions were answered. She is planning to try and provide breast milk for her infant.    ______________________________________________________________________  Thank you for asking [place] to participate in the care of this patient. Please do not hesitate to contact us again if you are aware of any further ways we can  be of assistance.   Sincerely,  Denna Haggard, MD Attending Neonatologist   I spent ~40 minutes in consultation time, of which 25 minutes was spent in direct face to face counseling.

## 2022-07-09 NOTE — Consult Note (Addendum)
Neurology Consultation Reason for Consult: Headache Referring Physician: Conni Elliot, C  CC: Headache  History is obtained from: Patient  HPI: Brianna Booth is a 36 y.o. female with 4-day history of headaches.  She states that the headache came on gradually and then presented with elevated blood pressures and headaches to MAU.  Since that time, she has had persistent unilateral right-sided headache that is associated with photophobia, though photophobia is not very prominent.  She has had intermittent episodes of flashing lights that occur periodically throughout the day and last about 15 minutes at a time.  She has had some nausea and vomiting as well.  She does not have a history of similar headaches, but does have a daughter with migraines.  Given the symptoms, an MRI/MRV was initially recommended, but she has piercings and was concerned about the MRI, though MRI states that this is not an absolute contraindication and informed the patient of that.   Past Medical History:  Diagnosis Date   Anxiety    Asthma    Bipolar 1 disorder ([facility])    Depression    Essential hypertension 02/22/2019   Headache    Preterm labor    Schizo-affective schizophrenia ([facility])      Family History  Problem Relation Age of Onset   Hypertension Father    Diabetes Brother    Diabetes Maternal Aunt    Asthma Maternal Grandfather    Cancer Neg Hx    Heart disease Neg Hx      Social History:  reports that she has been smoking cigarettes. She has never used smokeless tobacco. She reports that she does not currently use alcohol. She reports current drug use. Drug: Marijuana.   Exam: Current vital signs: BP (!) 148/95 (BP Location: Left Arm)   Pulse 93   Temp 98 F (36.7 C) (Oral)   Resp 18   Ht 5\' 7"  (1.702 m)   Wt 119.7 kg   LMP 11/26/2021   SpO2 100%   BMI 41.35 kg/m  Vital signs in last 24 hours: Temp:  [97.2 F (36.2 C)-98.6 F (37 C)] 98 F (36.7 C) (12/21 1954) Pulse Rate:  [85-106] 93  (12/21 1954) Resp:  [16-18] 18 (12/21 2300) BP: (126-149)/(60-96) 148/95 (12/21 1954) SpO2:  [100 %] 100 % (12/21 1954)   Physical Exam  Constitutional: Appears well-developed and well-nourished.  Psych: Affect appropriate to situation Eyes: No scleral injection HENT: No OP obstruction MSK: no joint deformities.  Cardiovascular: Normal rate and regular rhythm.  Respiratory: Effort normal, non-labored breathing GI: Soft.  No distension. There is no tenderness.  Skin: WDI  Neuro: Mental Status: Patient is awake, alert, oriented to person, place, month, year, and situation. Patient is able to give a clear and coherent history. No signs of aphasia or neglect Cranial Nerves: II: Visual Fields are full. Pupils are equal, round, and reactive to light.  Funduscopic exam is difficult because of cosmetic lenses which she is not willing to remove. III,IV, VI: EOMI without ptosis or diploplia.  V: Facial sensation is symmetric to temperature VII: Facial movement is symmetric.  VIII: hearing is intact to voice X: Uvula elevates symmetrically XI: Shoulder shrug is symmetric. XII: tongue is midline without atrophy or fasciculations.  Motor: Tone is normal. Bulk is normal. 5/5 strength was present in all four extremities.  Sensory: Sensation is symmetric to light touch and temperature in the arms and legs. Deep Tendon Reflexes: 2-3+ and symmetric in the biceps and patellae.  No spread or other  definite evidence of hyperreflexia. Cerebellar: FNF intact bilaterally      I have reviewed labs in epic and the results pertinent to this consultation are: Calcium 7.5 with an albumin of 3.0 Creatinine 0.46 Magnesium 5.1  I have reviewed the images obtained: CT head-negative  Impression: 36 year old female with new onset headaches in the setting of hypertension and 32-week pregnancy.  Based on the fact that she gets the flashing lights, and the unilateral nature of the headache, my suspicion  is for complicated migraine.  She has had some improvement over the course of the day, and I wonder if this is due to the use of Compazine and Phenergan.  I will schedule Compazine x 4 doses, and hopefully she will continue to have improvement.  If not CT venogram could be considered, though there is some risk to thyroid suppression in the fetus, I do think that if she does not have improvement venous sinus thrombosis would need to be considered and ruled out.  She is on a magnesium drip, and magnesium can also be helpful for migraine.  Preeclampsia is a possibility, and I have seen PRES present predominantly with headache, but this is unusual.    MRI would certainly be helpful, but unfortunately it seems as we will not be able to get that information due to her concerns with her facial piercings, though per MRI techs the piercings are not contraindications and it could be attempted.  Recommendations: 1) Compazine 10 mg every 6 hours x 4 doses 2) magnesium per OB 3) consider CTV if continues to have severe headache tomorrow.  I would continue to encourage her to try MRI given it is usually safe with facial piercings. 4) neurology will follow   Ritta Slot, MD [facility] [phone]  If 7pm- 7am, please page neurology on call as listed in [facility]ION.

## 2022-07-10 ENCOUNTER — Inpatient Hospital Stay (HOSPITAL_COMMUNITY): Payer: Medicare HMO

## 2022-07-10 ENCOUNTER — Encounter (HOSPITAL_COMMUNITY): Payer: Self-pay

## 2022-07-10 DIAGNOSIS — O113 Pre-existing hypertension with pre-eclampsia, third trimester: Principal | ICD-10-CM

## 2022-07-10 DIAGNOSIS — Z3A32 32 weeks gestation of pregnancy: Secondary | ICD-10-CM | POA: Diagnosis not present

## 2022-07-10 LAB — COMPREHENSIVE METABOLIC PANEL
ALT: 18 U/L (ref 0–44)
AST: 25 U/L (ref 15–41)
Albumin: 3 g/dL — ABNORMAL LOW (ref 3.5–5.0)
Alkaline Phosphatase: 101 U/L (ref 38–126)
Anion gap: 10 (ref 5–15)
BUN: <5 mg/dL — ABNORMAL LOW (ref 6–20)
CO2: 20 mmol/L — ABNORMAL LOW (ref 22–32)
Calcium: 7.6 mg/dL — ABNORMAL LOW (ref 8.9–10.3)
Chloride: 107 mmol/L (ref 98–111)
Creatinine, Ser: 0.55 mg/dL (ref 0.44–1.00)
GFR, Estimated: >60 mL/min (ref >60)
Glucose, Bld: 110 mg/dL — ABNORMAL HIGH (ref 70–99)
Potassium: 4.2 mmol/L (ref 3.5–5.1)
Sodium: 137 mmol/L (ref 135–145)
Total Bilirubin: 0.1 mg/dL — ABNORMAL LOW (ref 0.3–1.2)
Total Protein: 6.6 g/dL (ref 6.5–8.1)

## 2022-07-10 LAB — CBC
HCT: 36.8 % (ref 36.0–46.0)
Hemoglobin: 12.1 g/dL (ref 12.0–15.0)
MCH: 29.3 pg (ref 26.0–34.0)
MCHC: 32.9 g/dL (ref 30.0–36.0)
MCV: 89.1 fL (ref 80.0–100.0)
Platelets: 225 10*3/uL (ref 150–400)
RBC: 4.13 MIL/uL (ref 3.87–5.11)
RDW: 14.6 % (ref 11.5–15.5)
WBC: 16.6 10*3/uL — ABNORMAL HIGH (ref 4.0–10.5)
nRBC: 0 % (ref 0.0–0.2)

## 2022-07-10 LAB — MAGNESIUM
Magnesium: 5.4 mg/dL — ABNORMAL HIGH (ref 1.7–2.4)
Magnesium: 5.3 mg/dL — ABNORMAL HIGH (ref 1.7–2.4)

## 2022-07-10 MED ORDER — IOHEXOL 350 MG/ML SOLN
75.0000 mL | Freq: Once | INTRAVENOUS | Status: AC | PRN
  Administered 2022-07-10: 75 mL via INTRAVENOUS

## 2022-07-10 MED ORDER — LORAZEPAM 2 MG/ML IJ SOLN
INTRAMUSCULAR | Status: AC
  Filled 2022-07-10: qty 1

## 2022-07-10 MED ORDER — LORAZEPAM 2 MG/ML IJ SOLN
1.0000 mg | Freq: Once | INTRAMUSCULAR | Status: AC | PRN
  Administered 2022-07-10: 1 mg via INTRAVENOUS

## 2022-07-10 NOTE — Progress Notes (Signed)
Neurology Progress Note  Brief HPI: 36 year old female with PMHx of anxiety, bipolar 1 disorder, depression, essential hypertension, schizoaffective schizophrenia, history of preterm labor currently [redacted] weeks pregnant who presented 12/20 for evaluation of 4-day history of headaches.  History of headaches.  On evaluation at [facility], patient had elevated blood pressure and was admitted for further evaluation.  Patient complains of persistent unilateral right-sided headache with associated photophobia and intermittent episodes of flashing lights that occur periodically through the day lasting approximately 15 minutes.  Patient does have associated nausea and vomiting.  Subjective: Patient states that her eyes feel weak HA without improvement Throbbing headache 4-5 days; denies headache history.  10/10 headache, states no improvement with compazine injections.  Other mediations provided while inpatient without reported headache improvement: magnesium per OB, fioricet, acetaminophen, flexeril, decadron, oxycodone, compazine, phenergan, and dilaudid.  Initially refused MRI MRV imaging due to facial piercings but today, patient is agreeable to imaging as these are not an absolute contraindication to MRI.   Exam: Vitals:   07/10/22 0655 07/10/22 0757  BP:  137/77  Pulse:  91  Resp: 16   Temp:    SpO2:  99%   Gen: Laying in bed, appears to be uncomfortable, cradling head and complaining of ongoing head pain  Resp: non-labored breathing, no acute distress on room air Abd: rounded, non-tender  Neuro: Mental Status: Awake, alert, and oriented. She is able to provide a clear and coherent history of present illness. Speech is fluent without dysarthria or aphasia.  Cranial Nerves: PERRL, EOMI, VFF, facial sensation is intact and symmetric to light touch, facial movement is symmetric, hearing is intact to voice, shoulders shrug symmetrically, tongue is midline.  Motor: Moves all extremities spontaneously and  antigravity without asymmetry or weakness.  Sensory: Intact and equal to light touch throughout Gait: Deferred  Pertinent Labs: CBC    Component Value Date/Time   WBC 16.6 (H) 07/10/2022 0423   RBC 4.13 07/10/2022 0423   HGB 12.1 07/10/2022 0423   HGB 11.6 06/15/2018 1045   HCT 36.8 07/10/2022 0423   HCT 34.9 06/15/2018 1045   PLT 225 07/10/2022 0423   PLT 171 06/15/2018 1045   MCV 89.1 07/10/2022 0423   MCV 88 06/15/2018 1045   MCH 29.3 07/10/2022 0423   MCHC 32.9 07/10/2022 0423   RDW 14.6 07/10/2022 0423   RDW 12.4 06/15/2018 1045   LYMPHSABS 1.2 07/08/2022 1730   LYMPHSABS 2.2 06/15/2018 1045   MONOABS 0.2 07/08/2022 1730   EOSABS 0.0 07/08/2022 1730   EOSABS 0.3 06/15/2018 1045   BASOSABS 0.0 07/08/2022 1730   BASOSABS 0.0 06/15/2018 1045   CMP     Component Value Date/Time   NA 137 07/10/2022 0423   K 4.2 07/10/2022 0423   CL 107 07/10/2022 0423   CO2 20 (L) 07/10/2022 0423   GLUCOSE 110 (H) 07/10/2022 0423   BUN <5 (L) 07/10/2022 0423   CREATININE 0.55 07/10/2022 0423   CALCIUM 7.6 (L) 07/10/2022 0423   PROT 6.6 07/10/2022 0423   ALBUMIN 3.0 (L) 07/10/2022 0423   AST 25 07/10/2022 0423   ALT 18 07/10/2022 0423   ALKPHOS 101 07/10/2022 0423   BILITOT 0.1 (L) 07/10/2022 0423   GFRNONAA >60 07/10/2022 0423   GFRAA >60 03/16/2020 1539   Urinalysis    Component Value Date/Time   COLORURINE YELLOW 07/07/2022 2309   APPEARANCEUR HAZY (A) 07/07/2022 2309   LABSPEC 1.028 07/07/2022 2309   PHURINE 5.0 07/07/2022 2309   GLUCOSEU NEGATIVE 07/07/2022  2309   HGBUR NEGATIVE 07/07/2022 2309   BILIRUBINUR NEGATIVE 07/07/2022 2309   BILIRUBINUR Negative 04/07/2018 1102   KETONESUR NEGATIVE 07/07/2022 2309   PROTEINUR 30 (A) 07/07/2022 2309   UROBILINOGEN 0.2 04/07/2018 1102   UROBILINOGEN 0.2 06/11/2008 2032   NITRITE NEGATIVE 07/07/2022 2309   LEUKOCYTESUR NEGATIVE 07/07/2022 2309   Drugs of Abuse     Component Value Date/Time   LABOPIA NONE DETECTED  07/08/2022 1600   COCAINSCRNUR NONE DETECTED 07/08/2022 1600   LABBENZ NONE DETECTED 07/08/2022 1600   AMPHETMU NONE DETECTED 07/08/2022 1600   THCU NONE DETECTED 07/08/2022 1600   LABBARB POSITIVE (A) 07/08/2022 1600    Magnesium: 5.1  Imaging Reviewed:  CT head 12/20: No acute intracranial pathology.   Impression: 36 year old female with new onset headaches in the setting of hypertension and 32-week pregnancy.  Based on the fact that she gets the flashing lights, and the unilateral nature of the headache, my suspicion is for complicated migraine.  She initially reported some improvement on neurology evaluation 12/21, though today she denies any improvement and persistent 10/10 severity headache. Will continue scheduled Compazine x 4 doses though with reports of no improvement so far, further imaging has been scheduled with MRI brain and MRV head. I do think that if she does not have improvement venous sinus thrombosis would need to be considered and ruled out.  She is on a magnesium drip, and magnesium can also be helpful for migraine but may also potentiate headaches.  Preeclampsia is a possibility, and I have seen PRES present predominantly with headache, but this is unusual.     Initially, patient refused MRI due to her concerns with her facial piercings, though overnight it was reported that facial piercings are not complete contraindications and MRI imaging could be considered. Patient was agreeable this morning and MRI/MRV were ordered though per radiology today, unable to complete imaging due to facial piercing and piercing would have to be removed to complete imaging. Per patient, left cheek piercing would require surgical removal.   Recommendations: - Continue compazine injections and monitor for improvement - IVF and mag gtt per [facility] - Neurology will continue to follow - Consider CTV today/tomorrow if no improvement overnight with completion of compazine injections ordered overnight   - Further discussion with MFM and OB, possible induction planned for preeclampsia. CTV ordered prior to induction. Completed, not read yet. Await formal read.  Lanae Boast, AGACNP-BC [facility] [phone]  Attending Neurohospitalist Addendum Patient seen and examined with APP/Resident. Agree with the history and physical as documented above. Agree with the plan as documented, which I helped formulate. I have independently reviewed the chart, obtained history, review of systems and examined the patient.I have personally reviewed pertinent head/neck/spine imaging (CT/MRI). Please feel free to call with any questions.  -- Milon Dikes, MD Neurologist [facility] Pager: [phone]

## 2022-07-10 NOTE — Progress Notes (Signed)
Brianna Booth 36 y.o. [id number] at [redacted]w[redacted]d HD#4 admitted with cHTN with superimposed preeclampsia with intractable migraine  S: Patient sitting up in bed this morning, appears slightly more comfortable than she did last night. Initially she reports HA pain a 5/10 but boyfriend at bedside interjected and encouraged patient to be 'truthful' about her pain. Patient then admits that pain is just as bad as it was yesterday. States that none of the medications she has been given has taken the pain down in any degree. She still endorses seeing flashing lights and feels her Bells palsy is getting worse. RUQ/epigastric pain still present, unable to lay on right side due to discomforts. Did have some N/V this morning but able to keep down fluids. Now feeling dizzy when she tries to get up to bathroom. RN at bedside reports patient unsteady with last trip to bathroom and UOP has started to decrease over the past few hours although still adequate.  No obstetric complaints, appreciates good FM. No CTXs, VB, or LOF.  O: Vitals:   07/10/22 0757 07/10/22 0855 07/10/22 1010 07/10/22 1132  BP: 137/77   (!) 153/90  Pulse: 91   99  Resp:  20 18 20   Temp: 98.5 F (36.9 C)     TempSrc: Oral     SpO2: 99%     Weight:      Height:       Physical Exam: -General: AAO -Heart: RRR -Lungs: CTABL no rales, no crackles -Abdomen: gravid uterus -Extremities: +2 DTR LE, trace swelling   Intake/Output Summary (Last 24 hours) at 07/10/2022 1301 Last data filed at 07/10/2022 1242 Gross per 24 hour  Intake 4577.95 ml  Output 7000 ml  Net -2422.05 ml      Latest Ref Rng & Units 07/10/2022    4:23 AM 07/09/2022    9:04 PM 07/09/2022    1:46 AM  CBC  WBC 4.0 - 10.5 K/uL 16.6  15.2  14.4   Hemoglobin 12.0 - 15.0 g/dL 12.1  12.0  11.7   Hematocrit 36.0 - 46.0 % 36.8  35.6  34.6   Platelets 150 - 400 K/uL 225  255  232       Latest Ref Rng & Units 07/10/2022    4:23 AM 07/09/2022    9:04 PM 07/09/2022    8:44 AM   CMP  Glucose 70 - 99 mg/dL 110  128  123   BUN 6 - 20 mg/dL <5  <5  <5   Creatinine 0.44 - 1.00 mg/dL 0.55  0.46  0.49   Sodium 135 - 145 mmol/L 137  137  135   Potassium 3.5 - 5.1 mmol/L 4.2  4.3  3.9   Chloride 98 - 111 mmol/L 107  110  108   CO2 22 - 32 mmol/L 20  20  19    Calcium 8.9 - 10.3 mg/dL 7.6  7.5  7.5   Total Protein 6.5 - 8.1 g/dL 6.6  6.7  6.9   Total Bilirubin 0.3 - 1.2 mg/dL 0.1  <0.1  0.3   Alkaline Phos 38 - 126 U/L 101  103  97   AST 15 - 41 U/L 25  24  26    ALT 0 - 44 U/L 18  19  20     Fetal Monitoring: 12/22 AM: Tracing somewhat discontinuous but overall reassuring Category I baseline 130 bpm mod var with accels, no decels, Acontractile on Toco  12/19 MFM [place] vertex EFW 4#7 AFI 11.6cm  A/P: Brianna Cross  Booth 36 y.o. [id number] at [redacted]w[redacted]d HD#4 admitted with cHTN SI PEC with intractable migraine with no clinical improvement    Intractable HA/Migraine -Patient has had persistent HA despite regimen of PO Tylenol1g q6hr, Oxycodone 5mg , Phenergan 25mg , and Compazine 10mg  -S/p normal head CT -Neuro following, appreciate recommendations. They have recommended MRI however patient unable to have done d/t dermal piercing that she states cannot be removed. Per discussion with Neurology and MFM, plan to proceed with CTV- if negative for acute intracranial pathology, will proceed with IOL   cHTN SI PEC SF -BP well controlled on Procardia 30XL q12hr -PIH labs WNL, cont serial labs  -Magnesium sulfate ppx, last Mag level therapeutic at 3g/hr however patient symptomatic and will decrease to 2g/hr to avoid Mag toxicity   Prematurity -S/p BMZ 2nd dose given this morning 10AM -NICU consult done  Schizoaffective disorder -Resumed Lamictal and Abilify -Ativan PRN anxiety   Signed out to Dr. who will be on call over the weekend regarding plan of care   Brianna Booth 07/10/22 12:47 PM

## 2022-07-10 NOTE — Progress Notes (Signed)
MFM Progress Note Patient Name: Brianna Booth  Patient MRN:   [id number]  Referring provider: Dr. Juliene Pina  Reason for Consult: Elevated blood pressure, headache and pregnancy  HPI: Brianna Booth is a 36 y.o. [id number] at [redacted]w[redacted]d admitted   Admission HPI: Brianna Booth was admitted 2 days ago for headache in the setting of elevated blood pressures requiring IV labetalol.  She has been admitted and undergone treatment for headache with some improvement.  She is currently on a magnesium infusion for preeclampsia prophylaxis.  She had a head CT that was negative.  Neurology consult was done due to minimal improvement in Brianna Booth headaches.  She is currently being treated for complex migraine.  It was recommended that if she does not improve over the next day or so that further head imaging would be indicated to rule out a venous sinus thrombosis.  24h updates: [facility] reports an ongoing headache since last night despite analgesia, Fioricet, Compazine, and Phenergan.  Neurology was consulted to assess for nonpregnancy related source of headache.  They wanted to rule out a dural venous sinus thrombosis with an MRI/MRV but could not due to the patient's facial piercings.  They reported that they were going to discuss possibly using a CT with contrast.  She is currently being treated for complex migraine with Phenergan and Compazine but is not receiving any relief.  Brianna Booth also reports having worsening shortness of breath and pleuritic chest pain on occasion but then resolved spontaneously.  She denies any visual disturbances other than blurry vision since being on magnesium.  She had some nausea vomiting this morning but is able to tolerate fluids.  I discussed possible indications for delivery and stated that we may need to deliver Brianna Booth if Brianna Booth headache does not improve with medications and Brianna Booth head imaging is negative for any other acute process.  She and the father the baby reported understanding and are agreeable with the plan as  outlined.  Review of Systems: A review of systems was performed and was negative except per HPI   Vitals and Physical Exam    07/10/2022   11:32 AM 07/10/2022    7:57 AM 07/10/2022    3:42 AM  Vitals with BMI  Systolic 153 137 010  Diastolic 90 77 71  Pulse 99 91 86   Lying comfortably in bed  Unilateral acial droop consistent with prior Bell's palsy Gross sensation and motor function intact. Nonlabored breathing Normal rate and rhythm Abdomen is nontender  Assessment - Chronic hypertension with superimposed preeclampsia with severe features vs complex migraine Plan -Status post betamethasone -Currently on magnesium for eclampsia prophylaxis -Neurology consult, currently being treated for a complex migraine as well.  CT venogram or MRV was recommended if the patient's symptoms do not improve to rule out a venous sinus thrombosis. -Decrease magnesium infusion rate to 2 g/h and bolus 2 g as needed to achieve a magnesium level of 5-7. -I discussed the patient with Dr. Conni Elliot and will continue to follow together. -If the patient has any of the following delivery should be considered: Nonreassuring fetal heart tracing not responsive to intrauterine resuscitation, severe persistent maternal headache despite antihypertensive and analgesic treatment with no other identifiable cause other than preeclampsia, difficult to control blood pressure despite maximum doses of 1-2 antihypertensive medications, maternal labs consistent with preeclampsia with severe features. If Brianna Booth head imaging is negative and Brianna Booth headache persists we will recommend delivery due to preeclampsia with severe features in the setting of persistent neurologic features.  I spent  30 minutes reviewing the patients chart, including labs and images as well as counseling the patient about Brianna Booth medical conditions.  [name]  MFM, [facility]   07/10/2022  12:13 PM

## 2022-07-10 NOTE — Progress Notes (Signed)
HD#4 CHTN with superimposed pre-eclampsia with intractable h/a  Pt sleeping when I entered room; says h/a off/on, currently throbbing - difficult to quantify pain level (on a pain scale) though it is right/frontal; she also has Bell's Palsy on this side - sx have been throughout pregnancy though feels worse recently  No abd pain, no ctx/bleeding/lof; +fm  Patient Vitals for the past 24 hrs:  BP Temp Temp src Pulse Resp SpO2  07/10/22 1953 (!) 150/92 -- -- (!) 117 -- --  07/10/22 1930 (!) 167/109 98.2 F (36.8 C) Oral 98 20 95 %  07/10/22 1845 -- -- -- -- (!) 22 --  07/10/22 1739 -- -- -- -- 20 --  07/10/22 1630 -- -- -- -- 20 --  07/10/22 1530 -- -- -- -- 20 --  07/10/22 1431 (!) 144/88 -- -- (!) 111 (!) 22 --  07/10/22 1350 -- -- -- -- 20 --  07/10/22 1302 -- -- -- -- 20 --  07/10/22 1220 -- -- -- -- (!) 22 --  07/10/22 1132 (!) 153/90 -- -- 99 20 100 %  07/10/22 1010 -- -- -- -- 18 --  07/10/22 0855 -- -- -- -- 20 --  07/10/22 0757 137/77 98.5 F (36.9 C) Oral 91 -- 99 %  07/10/22 0655 -- -- -- -- 16 --  07/10/22 0600 -- -- -- -- 18 --  07/10/22 0500 -- -- -- -- 16 --  07/10/22 0400 -- -- -- -- 17 --  07/10/22 0342 (!) 158/71 98.6 F (37 C) Oral 86 18 98 %  07/10/22 0300 -- -- -- -- 18 --  07/10/22 0200 -- -- -- -- 17 --  07/10/22 0100 -- -- -- -- 16 --  07/09/22 2357 116/63 98.7 F (37.1 C) Oral 85 17 97 %  07/09/22 2300 -- -- -- -- 18 --    Intake/Output Summary (Last 24 hours) at 07/10/2022 2255 Last data filed at 07/10/2022 2250 Gross per 24 hour  Intake 3224.42 ml  Output 7550 ml  Net -4325.58 ml     A&ox3 Rrr Ctab Abd: soft,nt, nd; gravid LE: trace edema, nt bilat  NST:12/22 2204 FHT: 130s, nml variability, +accels, no decels TOCO: no ctx  CT Venogram : IMPRESSION: 1. Normal CT venogram. No evidence for dural sinus thrombosis. 2. No other acute intracranial abnormality.      Latest Ref Rng & Units 07/10/2022    4:23 AM 07/09/2022    9:04 PM  07/09/2022    1:46 AM  CBC  WBC 4.0 - 10.5 K/uL 16.6  15.2  14.4   Hemoglobin 12.0 - 15.0 g/dL 12.1  12.0  11.7   Hematocrit 36.0 - 46.0 % 36.8  35.6  34.6   Platelets 150 - 400 K/uL 225  255  232       Latest Ref Rng & Units 07/10/2022    4:23 AM 07/09/2022    9:04 PM 07/09/2022    8:44 AM  CMP  Glucose 70 - 99 mg/dL 110  128  123   BUN 6 - 20 mg/dL <5  <5  <5   Creatinine 0.44 - 1.00 mg/dL 0.55  0.46  0.49   Sodium 135 - 145 mmol/L 137  137  135   Potassium 3.5 - 5.1 mmol/L 4.2  4.3  3.9   Chloride 98 - 111 mmol/L 107  110  108   CO2 22 - 32 mmol/L 20  20  19    Calcium 8.9 - 10.3 mg/dL  7.6  7.5  7.5   Total Protein 6.5 - 8.1 g/dL 6.6  6.7  6.9   Total Bilirubin 0.3 - 1.2 mg/dL 0.1  <1.4  0.3   Alkaline Phos 38 - 126 U/L 101  103  97   AST 15 - 41 U/L 25  24  26    ALT 0 - 44 U/L 18  19  20     12/19 MFM vertex EFW 4#7 AFI 11.6cm  1/20 36 y.o. [place] at [redacted]w[redacted]d HD#4 admitted with cHTN SI PEC with intractable migraine with no clinical improvement   a/p Intractable HA  -Patient has had persistent HA despite regimen of PO Tylenol1g q6hr, Oxycodone 5mg , Phenergan 25mg , and Compazine 10mg  -S/p normal head CT, negative CTV; I have spoken with Dr [id number] (neurology) who feels that with no neural symptoms and negative workup, etiology is likely d/t severe pre-eclampsia and management per MFM/OB team; would plan to have pt f/u for h/a as outpt unless further issues arise; I have also spoken with Dr [redacted]w[redacted]d (MFM) regarding negative head imaging; in light of her diagnosis of CHTN with superimposed pre-eclampsia with severe features (severe bps) and persistent maternal sx of h/a, should now move to delivery; she has no neuro deficits and a negative workup with neuro; this is in alignment with Dr recommendations for delivery if negative imaging.   cHTN SI PEC SF -BP well controlled on Procardia 30XL q12hr -PIH labs WNL -Magnesium sulfate - pt is now over 48 hours on  magnesium, last check 1307 and 5.3, rate is 2g/hr -Prematurity -S/p BMZ 2nd dose given 12/21 10am -NICU consult done earlier on admission however not currently accepting admissions   After decision made to delivery, I informed l/d and was told that they had no bed available and uncertain when would have a bed available - perhaps early tomorrow morning. I was then informed that NICU is not taking admissions. I then spoke with the neonatologist (Dr. ) who confirmed that they cannot take any more admissions and will not be able to take any admissions for a couple of days.  I informed the patient of the issues and recommendation for transfer. The patient lives in [place] and would like transfer to [facility].  I spoke with Dr Rich Number, MD who has accepted the patient. I have also spoken with 1/22, CNM who will be assisting in the care of the patient.  NICU at [facility] was confirmed to be able to accept baby for admission.  The patient will be transferred for IOL and NICU care for preterm. The patient is ready for transfer and is stable.  I have spent 2 1/2 hours in coordination of care, exam, charting, counseling for this patient.

## 2022-07-10 NOTE — Progress Notes (Signed)
RN attempted to monitor fetal heart tones for 1 hr. Difficulty in obtaining FHR for full hour in a consistent pattern to due to MOB's positioning and heavy breathing while sleeping. FHR reassuring while on the monitor.

## 2022-07-10 NOTE — Progress Notes (Signed)
PC to MRI to request a discussion with neurology to determine if she would be able to have MRI performed without removing the facial piercing's. Chip Boer from MRI calling to inform RN that per Dr. Phillips Odor the MRI Neurologist, the MRI would not be able to be performed without removal of the piercing.

## 2022-07-10 NOTE — Progress Notes (Signed)
Dr @ bedside discussing plan of care, Magnesium Sulfate decreased to 3g/hr per MD/

## 2022-07-11 ENCOUNTER — Other Ambulatory Visit: Payer: Self-pay

## 2022-07-11 ENCOUNTER — Inpatient Hospital Stay: Payer: Medicare HMO | Admitting: Anesthesiology

## 2022-07-11 ENCOUNTER — Encounter: Payer: Self-pay | Admitting: Obstetrics and Gynecology

## 2022-07-11 ENCOUNTER — Inpatient Hospital Stay: Payer: Medicare HMO

## 2022-07-11 ENCOUNTER — Inpatient Hospital Stay
Admission: AD | Admit: 2022-07-11 | Discharge: 2022-07-15 | DRG: 797 | Disposition: A | Discharge status: Home / Self Care | Payer: Medicare HMO

## 2022-07-11 DIAGNOSIS — Z3A32 32 weeks gestation of pregnancy: Secondary | ICD-10-CM

## 2022-07-11 DIAGNOSIS — O99334 Smoking (tobacco) complicating childbirth: Secondary | ICD-10-CM | POA: Diagnosis present

## 2022-07-11 DIAGNOSIS — O119 Pre-existing hypertension with pre-eclampsia, unspecified trimester: Secondary | ICD-10-CM | POA: Diagnosis present

## 2022-07-11 DIAGNOSIS — F419 Anxiety disorder, unspecified: Secondary | ICD-10-CM | POA: Diagnosis present

## 2022-07-11 DIAGNOSIS — F17210 Nicotine dependence, cigarettes, uncomplicated: Secondary | ICD-10-CM | POA: Diagnosis present

## 2022-07-11 DIAGNOSIS — O9952 Diseases of the respiratory system complicating childbirth: Secondary | ICD-10-CM | POA: Diagnosis not present

## 2022-07-11 DIAGNOSIS — J4520 Mild intermittent asthma, uncomplicated: Secondary | ICD-10-CM | POA: Diagnosis present

## 2022-07-11 DIAGNOSIS — O1413 Severe pre-eclampsia, third trimester: Secondary | ICD-10-CM | POA: Diagnosis not present

## 2022-07-11 DIAGNOSIS — O114 Pre-existing hypertension with pre-eclampsia, complicating childbirth: Principal | ICD-10-CM | POA: Diagnosis present

## 2022-07-11 DIAGNOSIS — O1093 Unspecified pre-existing hypertension complicating the puerperium: Secondary | ICD-10-CM | POA: Diagnosis not present

## 2022-07-11 DIAGNOSIS — O1002 Pre-existing essential hypertension complicating childbirth: Secondary | ICD-10-CM | POA: Diagnosis present

## 2022-07-11 DIAGNOSIS — I10 Essential (primary) hypertension: Secondary | ICD-10-CM | POA: Diagnosis not present

## 2022-07-11 DIAGNOSIS — F259 Schizoaffective disorder, unspecified: Secondary | ICD-10-CM | POA: Diagnosis present

## 2022-07-11 DIAGNOSIS — K5900 Constipation, unspecified: Secondary | ICD-10-CM | POA: Diagnosis present

## 2022-07-11 DIAGNOSIS — O99344 Other mental disorders complicating childbirth: Secondary | ICD-10-CM | POA: Diagnosis present

## 2022-07-11 DIAGNOSIS — O1490 Unspecified pre-eclampsia, unspecified trimester: Principal | ICD-10-CM

## 2022-07-11 DIAGNOSIS — D62 Acute posthemorrhagic anemia: Secondary | ICD-10-CM | POA: Diagnosis not present

## 2022-07-11 DIAGNOSIS — O1493 Unspecified pre-eclampsia, third trimester: Secondary | ICD-10-CM | POA: Diagnosis not present

## 2022-07-11 DIAGNOSIS — O365930 Maternal care for other known or suspected poor fetal growth, third trimester, not applicable or unspecified: Secondary | ICD-10-CM | POA: Diagnosis not present

## 2022-07-11 DIAGNOSIS — O99345 Other mental disorders complicating the puerperium: Secondary | ICD-10-CM | POA: Diagnosis not present

## 2022-07-11 DIAGNOSIS — O99893 Other specified diseases and conditions complicating puerperium: Secondary | ICD-10-CM | POA: Diagnosis present

## 2022-07-11 DIAGNOSIS — O9963 Diseases of the digestive system complicating the puerperium: Secondary | ICD-10-CM | POA: Diagnosis not present

## 2022-07-11 DIAGNOSIS — O115 Pre-existing hypertension with pre-eclampsia, complicating the puerperium: Secondary | ICD-10-CM | POA: Diagnosis not present

## 2022-07-11 DIAGNOSIS — Z20822 Contact with and (suspected) exposure to covid-19: Secondary | ICD-10-CM | POA: Diagnosis present

## 2022-07-11 DIAGNOSIS — O9081 Anemia of the puerperium: Secondary | ICD-10-CM | POA: Diagnosis not present

## 2022-07-11 DIAGNOSIS — J45909 Unspecified asthma, uncomplicated: Secondary | ICD-10-CM | POA: Diagnosis not present

## 2022-07-11 LAB — MAGNESIUM
Magnesium: 4 mg/dL — ABNORMAL HIGH (ref 1.7–2.4)
Magnesium: 4.1 mg/dL — ABNORMAL HIGH (ref 1.7–2.4)
Magnesium: 4.3 mg/dL — ABNORMAL HIGH (ref 1.7–2.4)

## 2022-07-11 LAB — RESP PANEL BY RT-PCR (RSV, FLU A&B, COVID)  RVPGX2
Influenza A by PCR: NEGATIVE
Influenza B by PCR: NEGATIVE
Resp Syncytial Virus by PCR: NEGATIVE
SARS Coronavirus 2 by RT PCR: NEGATIVE

## 2022-07-11 LAB — GROUP B STREP BY PCR: Group B strep by PCR: NEGATIVE

## 2022-07-11 MED ORDER — MISOPROSTOL 25 MCG QUARTER TABLET
25.0000 ug | ORAL_TABLET | Freq: Once | ORAL | Status: AC
  Administered 2022-07-11: 25 ug via VAGINAL
  Filled 2022-07-11: qty 1

## 2022-07-11 MED ORDER — OXYCODONE-ACETAMINOPHEN 5-325 MG PO TABS: 2.0000 | ORAL_TABLET | ORAL | Status: DC | PRN

## 2022-07-11 MED ORDER — OXYTOCIN-SODIUM CHLORIDE 30-0.9 UT/500ML-% IV SOLN
2.5000 [IU]/h | INTRAVENOUS | Status: DC
  Administered 2022-07-12: 2.5 [IU]/h via INTRAVENOUS
  Filled 2022-07-11: qty 500

## 2022-07-11 MED ORDER — LIDOCAINE-EPINEPHRINE (PF) 1.5 %-1:200000 IJ SOLN
INTRAMUSCULAR | Status: DC | PRN
  Administered 2022-07-11: 3 mL via PERINEURAL

## 2022-07-11 MED ORDER — NIFEDIPINE ER OSMOTIC RELEASE 30 MG PO TB24
30.0000 mg | ORAL_TABLET | Freq: Two times a day (BID) | ORAL | Status: DC
  Administered 2022-07-11 – 2022-07-12 (×3): 30 mg via ORAL
  Filled 2022-07-11 (×3): qty 1

## 2022-07-11 MED ORDER — LACTATED RINGERS IV SOLN: 500.0000 mL | INTRAVENOUS | Status: DC | PRN

## 2022-07-11 MED ORDER — MISOPROSTOL 200 MCG PO TABS
ORAL_TABLET | ORAL | Status: AC
  Administered 2022-07-12: 800 ug via RECTAL
  Filled 2022-07-11: qty 4

## 2022-07-11 MED ORDER — MAGNESIUM SULFATE BOLUS VIA INFUSION
4.0000 g | Freq: Once | INTRAVENOUS | Status: DC
  Filled 2022-07-11: qty 1000

## 2022-07-11 MED ORDER — LABETALOL HCL 5 MG/ML IV SOLN
80.0000 mg | INTRAVENOUS | Status: DC | PRN
  Filled 2022-07-11: qty 16

## 2022-07-11 MED ORDER — OXYCODONE-ACETAMINOPHEN 5-325 MG PO TABS: 1.0000 | ORAL_TABLET | ORAL | Status: DC | PRN

## 2022-07-11 MED ORDER — TERBUTALINE SULFATE 1 MG/ML IJ SOLN: 0.2500 mg | Freq: Once | INTRAMUSCULAR | Status: DC | PRN

## 2022-07-11 MED ORDER — LIDOCAINE HCL (PF) 1 % IJ SOLN: 30.0000 mL | INTRAMUSCULAR | Status: DC | PRN

## 2022-07-11 MED ORDER — ONDANSETRON HCL 4 MG/2ML IJ SOLN
4.0000 mg | Freq: Four times a day (QID) | INTRAMUSCULAR | Status: DC | PRN
  Administered 2022-07-11 (×2): 4 mg via INTRAVENOUS
  Filled 2022-07-11 (×2): qty 2

## 2022-07-11 MED ORDER — LACTATED RINGERS IV SOLN: INTRAVENOUS | Status: DC

## 2022-07-11 MED ORDER — LIDOCAINE HCL (PF) 1 % IJ SOLN
INTRAMUSCULAR | Status: DC | PRN
  Administered 2022-07-11: 3 mL

## 2022-07-11 MED ORDER — CALCIUM GLUCONATE 10 % IV SOLN
INTRAVENOUS | Status: AC
  Filled 2022-07-11: qty 10

## 2022-07-11 MED ORDER — LAMOTRIGINE 25 MG PO TABS
150.0000 mg | ORAL_TABLET | Freq: Every day | ORAL | Status: DC
  Administered 2022-07-11 – 2022-07-14 (×5): 150 mg via ORAL
  Filled 2022-07-11 (×5): qty 2

## 2022-07-11 MED ORDER — AMMONIA AROMATIC IN INHA
RESPIRATORY_TRACT | Status: AC
  Filled 2022-07-11: qty 10

## 2022-07-11 MED ORDER — MISOPROSTOL 25 MCG QUARTER TABLET
25.0000 ug | ORAL_TABLET | Freq: Once | ORAL | Status: AC
  Administered 2022-07-11: 25 ug via ORAL
  Filled 2022-07-11: qty 1

## 2022-07-11 MED ORDER — SOD CITRATE-CITRIC ACID 500-334 MG/5ML PO SOLN
30.0000 mL | ORAL | Status: DC | PRN
  Administered 2022-07-12: 30 mL via ORAL

## 2022-07-11 MED ORDER — ARIPIPRAZOLE 10 MG PO TABS
20.0000 mg | ORAL_TABLET | Freq: Every day | ORAL | Status: DC
  Administered 2022-07-11 – 2022-07-14 (×5): 20 mg via ORAL
  Filled 2022-07-11 (×5): qty 2

## 2022-07-11 MED ORDER — PANTOPRAZOLE SODIUM 40 MG PO TBEC
40.0000 mg | DELAYED_RELEASE_TABLET | Freq: Two times a day (BID) | ORAL | Status: DC
  Administered 2022-07-11 – 2022-07-15 (×6): 40 mg via ORAL
  Filled 2022-07-11 (×6): qty 1

## 2022-07-11 MED ORDER — FENTANYL-BUPIVACAINE-NACL 0.5-0.125-0.9 MG/250ML-% EP SOLN
EPIDURAL | Status: AC
  Filled 2022-07-11: qty 250

## 2022-07-11 MED ORDER — GUAIFENESIN ER 600 MG PO TB12
600.0000 mg | ORAL_TABLET | Freq: Two times a day (BID) | ORAL | Status: DC | PRN
  Administered 2022-07-12: 600 mg via ORAL
  Filled 2022-07-11 (×2): qty 1

## 2022-07-11 MED ORDER — ACETAMINOPHEN 325 MG PO TABS
650.0000 mg | ORAL_TABLET | ORAL | Status: DC | PRN
  Filled 2022-07-11: qty 2

## 2022-07-11 MED ORDER — LIDOCAINE HCL (PF) 1 % IJ SOLN
INTRAMUSCULAR | Status: AC
  Filled 2022-07-11: qty 30

## 2022-07-11 MED ORDER — MISOPROSTOL 25 MCG QUARTER TABLET
25.0000 ug | ORAL_TABLET | ORAL | Status: DC
  Administered 2022-07-11 (×2): 25 ug via VAGINAL
  Filled 2022-07-11: qty 1

## 2022-07-11 MED ORDER — DIPHENHYDRAMINE HCL 50 MG/ML IJ SOLN
25.0000 mg | Freq: Four times a day (QID) | INTRAMUSCULAR | Status: DC | PRN
  Administered 2022-07-11 – 2022-07-12 (×2): 25 mg via INTRAVENOUS
  Filled 2022-07-11 (×2): qty 1

## 2022-07-11 MED ORDER — FENTANYL CITRATE (PF) 100 MCG/2ML IJ SOLN
50.0000 ug | INTRAMUSCULAR | Status: DC | PRN
  Administered 2022-07-11: 100 ug via INTRAVENOUS
  Administered 2022-07-12: 50 ug via INTRAVENOUS
  Filled 2022-07-11: qty 2

## 2022-07-11 MED ORDER — MAGNESIUM SULFATE 40 GM/1000ML IV SOLN
2.0000 g/h | INTRAVENOUS | Status: DC
  Administered 2022-07-11 – 2022-07-12 (×2): 2 g/h via INTRAVENOUS
  Filled 2022-07-11 (×2): qty 1000

## 2022-07-11 MED ORDER — BUPIVACAINE HCL (PF) 0.25 % IJ SOLN
INTRAMUSCULAR | Status: DC | PRN
  Administered 2022-07-11 (×2): 5 mL via EPIDURAL
  Administered 2022-07-12: 8 mL via EPIDURAL

## 2022-07-11 MED ORDER — LABETALOL HCL 5 MG/ML IV SOLN
40.0000 mg | INTRAVENOUS | Status: DC | PRN
  Administered 2022-07-12: 40 mg via INTRAVENOUS

## 2022-07-11 MED ORDER — LABETALOL HCL 5 MG/ML IV SOLN
20.0000 mg | INTRAVENOUS | Status: DC | PRN
  Administered 2022-07-12: 20 mg via INTRAVENOUS
  Filled 2022-07-11: qty 4

## 2022-07-11 MED ORDER — OXYTOCIN 10 UNIT/ML IJ SOLN
INTRAMUSCULAR | Status: AC
  Filled 2022-07-11: qty 2

## 2022-07-11 MED ORDER — FENTANYL-BUPIVACAINE-NACL 0.5-0.125-0.9 MG/250ML-% EP SOLN
EPIDURAL | Status: DC | PRN
  Administered 2022-07-11: 12 mL/h via EPIDURAL

## 2022-07-11 MED ORDER — HYDRALAZINE HCL 20 MG/ML IJ SOLN: 10.0000 mg | INTRAMUSCULAR | Status: DC | PRN

## 2022-07-11 MED ORDER — ACETAMINOPHEN 500 MG PO TABS
1000.0000 mg | ORAL_TABLET | ORAL | Status: DC | PRN
  Administered 2022-07-11 – 2022-07-14 (×11): 1000 mg via ORAL
  Filled 2022-07-11 (×11): qty 2

## 2022-07-11 MED ORDER — FERROUS SULFATE 325 (65 FE) MG PO TABS
325.0000 mg | ORAL_TABLET | Freq: Two times a day (BID) | ORAL | Status: DC
  Administered 2022-07-11 – 2022-07-12 (×3): 325 mg via ORAL
  Filled 2022-07-11 (×3): qty 1

## 2022-07-11 MED ORDER — ALBUTEROL SULFATE (2.5 MG/3ML) 0.083% IN NEBU: 2.5000 mg | INHALATION_SOLUTION | Freq: Four times a day (QID) | RESPIRATORY_TRACT | Status: DC | PRN

## 2022-07-11 MED ORDER — OXYTOCIN-SODIUM CHLORIDE 30-0.9 UT/500ML-% IV SOLN
1.0000 m[IU]/min | INTRAVENOUS | Status: DC
  Administered 2022-07-11 – 2022-07-12 (×2): 2 m[IU]/min via INTRAVENOUS
  Filled 2022-07-11: qty 500

## 2022-07-11 MED ORDER — OXYTOCIN BOLUS FROM INFUSION
333.0000 mL | Freq: Once | INTRAVENOUS | Status: AC
  Administered 2022-07-12: 333 mL via INTRAVENOUS

## 2022-07-11 MED ORDER — ALBUTEROL SULFATE HFA 108 (90 BASE) MCG/ACT IN AERS: 1.0000 | INHALATION_SPRAY | Freq: Four times a day (QID) | RESPIRATORY_TRACT | Status: DC | PRN

## 2022-07-11 MED ORDER — MISOPROSTOL 25 MCG QUARTER TABLET
ORAL_TABLET | ORAL | Status: AC
  Filled 2022-07-11: qty 2

## 2022-07-11 MED ORDER — MISOPROSTOL 25 MCG QUARTER TABLET
25.0000 ug | ORAL_TABLET | ORAL | Status: DC
  Administered 2022-07-11 (×2): 25 ug via ORAL
  Filled 2022-07-11: qty 1

## 2022-07-11 MED ORDER — LABETALOL HCL 5 MG/ML IV SOLN
INTRAVENOUS | Status: AC
  Filled 2022-07-11: qty 8

## 2022-07-11 NOTE — Progress Notes (Signed)
Labor Progress Note  Brianna Booth is a 36 y.o. [id number] at [redacted]w[redacted]d by LMP admitted for induction of labor due to pre-eclampsia with severe features.  Subjective: she is comfortable after her epidural  Objective: BP (!) 148/91 (BP Location: Left Arm)   Pulse 81   Temp 98.1 F (36.7 C) (Oral)   Resp 17   Ht 5\' 7"  (1.702 m)   Wt 119.7 kg   LMP 11/26/2021   SpO2 97%   BMI 41.35 kg/m  Vitals:   07/11/22 1217 07/11/22 1312 07/11/22 1431 07/11/22 1532  BP: 124/66 136/64 (!) 151/89 138/75   07/11/22 1632 07/11/22 1729 07/11/22 1832 07/11/22 1835  BP: (!) 140/66 (!) 148/70 (!) 167/115 (!) 150/80   07/11/22 1930 07/11/22 1934 07/11/22 2041 07/11/22 2053  BP: (!) 155/80 (!) 155/80 (!) 170/106 (!) 148/91    Notable VS details: reviewed  Fetal Assessment: FHT:  FHR: 140 bpm, variability: moderate,  accelerations:  Abscent,  decelerations:  Absent Category/reactivity:  Category I UC:   regular, every 2-3 minutes SVE:    Dilation: 7cm  Effacement: 70%  Station:  -2  Consistency: medium  Position: posterior  Membrane status:AROM @ 2121 Amniotic color: clear  Labs: Lab Results  Component Value Date   WBC 16.6 (H) 07/10/2022   HGB 12.1 07/10/2022   HCT 36.8 07/10/2022   MCV 89.1 07/10/2022   PLT 225 07/10/2022   I/O last 3 completed shifts: In: 2427.1 [P.O.:1380; I.V.:1047.1] Out: 3050 [Urine:3050] Total I/O In: 301.9 [I.V.:301.9] Out: 0    Assessment / Plan: 36 year old 31 at [redacted]w[redacted]d transferred from [facility] in [place] for IOL for pre-eclampsia with severe features  Labor:  Has received 3 doses of cytotec, [place] catheter came out, pitocin currently at  64mu/min, AROM with clear fluid Preeclampsia:   on magnesium sulfate, intake and ouput balanced, and labs stable - sent over from [facility] for pre-eclampsia with severe features. Had intractable headache and severe-range blood pressures. Normal CT of the head. Received Tylenol, Oxycodone, Phenergan,  and Compazine at [facility]. MFM recommended delivery for cHTN with superimposed pre-eclampsia with severe features and intractable headache.  - Patient has been on magnesium 2g/hr for almost 72hrs. Last magnesium level 07/11/22 @ 1711: 4.1 Fetal Wellbeing:  Category I Pain Control:  Epidural I/D:   GBS negative Anticipated MOD:  NSVD  07/13/22, CNM 07/11/2022, 9:31 PM

## 2022-07-11 NOTE — Anesthesia Procedure Notes (Signed)
Epidural Patient location during procedure: OB Start time: 07/11/2022 8:35 PM End time: 07/11/2022 8:39 PM  Staffing Anesthesiologist: Louie Boston, MD Performed: anesthesiologist   Preanesthetic Checklist Completed: patient identified, IV checked, site marked, risks and benefits discussed, surgical consent, monitors and equipment checked, pre-op evaluation and timeout performed  Epidural Patient position: sitting Prep: ChloraPrep Patient monitoring: heart rate, continuous pulse ox and blood pressure Approach: midline Location: L3-L4 Injection technique: LOR saline  Needle:  Needle type: Tuohy  Needle gauge: 17 G Needle length: 9 cm and 9 Needle insertion depth: 8 cm Catheter type: closed end flexible Catheter size: 19 Gauge Catheter at skin depth: 13 cm Test dose: negative and 1.5% lidocaine with Epi 1:200 K  Assessment Sensory level: T10 Events: blood not aspirated, injection not painful, no injection resistance, no paresthesia and negative IV test  Additional Notes 1 attempt Pt. Evaluated and documentation done after procedure finished. Patient identified. Risks/Benefits/Options discussed with patient including but not limited to bleeding, infection, nerve damage, paralysis, failed block, incomplete pain control, headache, blood pressure changes, nausea, vomiting, reactions to medication both or allergic, itching and postpartum back pain. Confirmed with bedside nurse the patient's most recent platelet count. Confirmed with patient that they are not currently taking any anticoagulation, have any bleeding history or any family history of bleeding disorders. Patient expressed understanding and wished to proceed. All questions were answered. Sterile technique was used throughout the entire procedure. Please see nursing notes for vital signs. Test dose was given through epidural catheter and negative prior to continuing to dose epidural or start infusion. Warning signs of high  block given to the patient including shortness of breath, tingling/numbness in hands, complete motor block, or any concerning symptoms with instructions to call for help. Patient was given instructions on fall risk and not to get out of bed. All questions and concerns addressed with instructions to call with any issues or inadequate analgesia.    Patient tolerated the insertion well without immediate complications. Reason for block:procedure for pain

## 2022-07-11 NOTE — Anesthesia Preprocedure Evaluation (Signed)
Anesthesia Evaluation  Patient identified by MRN, date of birth, ID band Patient awake    Reviewed: Allergy & Precautions, NPO status , Patient's Chart, lab work & pertinent test results  History of Anesthesia Complications Negative for: history of anesthetic complications  Airway Mallampati: III  TM Distance: >3 FB Neck ROM: full    Dental no notable dental hx.    Pulmonary asthma , sleep apnea , Current Smoker   Pulmonary exam normal breath sounds clear to auscultation       Cardiovascular Exercise Tolerance: Good hypertension (pre-E with severe features), On Medications Normal cardiovascular exam Rhythm:Regular Rate:Normal     Neuro/Psych  Headaches PSYCHIATRIC DISORDERS Anxiety Depression Bipolar Disorder Schizophrenia   Neuromuscular disease    GI/Hepatic ,GERD  ,,  Endo/Other    Renal/GU   negative genitourinary   Musculoskeletal   Abdominal   Peds  Hematology  (+) Blood dyscrasia, anemia   Anesthesia Other Findings Emergency case for retained products of conception.  Reproductive/Obstetrics                             Anesthesia Physical Anesthesia Plan  ASA: 3 and emergent  Anesthesia Plan: General   Post-op Pain Management:    Induction: Intravenous and Rapid sequence  PONV Risk Score and Plan: 2 and Ondansetron, Dexamethasone and Treatment may vary due to age or medical condition  Airway Management Planned: Oral ETT  Additional Equipment:   Intra-op Plan:   Post-operative Plan: Extubation in OR  Informed Consent: I have reviewed the patients History and Physical, chart, labs and discussed the procedure including the risks, benefits and alternatives for the proposed anesthesia with the patient or authorized representative who has indicated his/her understanding and acceptance.     Dental advisory given  Plan Discussed with: CRNA  Anesthesia Plan Comments:  (Patient consented for risks of anesthesia including but not limited to:  - adverse reactions to medications - damage to eyes, teeth, lips or other oral mucosa - nerve damage due to positioning  - sore throat or hoarseness - damage to heart, brain, nerves, lungs, other parts of body or loss of life  Informed patient about role of CRNA in peri- and intra-operative care.  Patient voiced understanding.)        Anesthesia Quick Evaluation

## 2022-07-11 NOTE — Progress Notes (Signed)
Labor Progress Note  Brianna Booth is a 36 y.o. [id number] at [redacted]w[redacted]d by LMP admitted for induction of labor due to pre-eclampsia with severe features.  Subjective: she is comfortable, feels occasional cramps, denies headache  Objective: BP (!) 148/66   Pulse 79   Temp 98.3 F (36.8 C) (Oral)   Resp 16   Ht 5\' 7"  (1.702 m)   Wt 119.7 kg   LMP 11/26/2021   SpO2 96%   BMI 41.35 kg/m  Vitals:   07/11/22 0140 07/11/22 0156 07/11/22 0210 07/11/22 0211  BP: (!) 156/78 (!) 159/81 (!) 144/81 (!) 144/81   07/11/22 0313 07/11/22 0356 07/11/22 0509 07/11/22 0609  BP: 135/65 139/83 (!) 148/77 (!) 142/77   07/11/22 0709 07/11/22 0718 07/11/22 0821 07/11/22 0908  BP: (!) 144/67 (!) 159/89 137/84 (!) 148/66   Notable VS details: reviewed, no severe-range blood pressures since admission to [facility]  Fetal Assessment: FHT:  FHR: 140 bpm, variability: moderate,  accelerations:  Abscent,  decelerations:  Absent Category/reactivity:  Category II UC:   irregular, every occasional minutes SVE:    Dilation: 1cm  Effacement: Long  Station:  -3  Consistency: medium  Position: posterior  Membrane status:intact Amniotic color: n/a  Labs: Lab Results  Component Value Date   WBC 16.6 (H) 07/10/2022   HGB 12.1 07/10/2022   HCT 36.8 07/10/2022   MCV 89.1 07/10/2022   PLT 225 07/10/2022   I/O last 3 completed shifts: In: 846.8 [P.O.:660; I.V.:186.8] Out: 300 [Urine:300] Total I/O In: 546.8 [P.O.:120; I.V.:426.8] Out: 1000 [Urine:1000]  Assessment / Plan: 36 year old 31 at [redacted]w[redacted]d transferred from [facility] in [place] for IOL for pre-eclampsia with severe features  Labor:  Progressing normally on cytotec, received 2 doses, second dose at 9am Preeclampsia:  on magnesium sulfate, intake and ouput balanced, and labs stable - sent over from [facility] for pre-eclampsia with severe features. Had intractable headache and severe-range blood pressures. Normal CT of the head.  Received Tylenol, Oxycodone, Phenergan, and Compazine at [facility]. MFM recommended delivery for cHTN with superimposed pre-eclampsia with severe features and intractable headache.  - Patient has been on magnesium 2g/hr for almost 72hrs. Last magnesium level 07/10/22 5.3. - Repeat magnesium level ordered. Fetal Wellbeing:  Category II Pain Control:  Epidural and IV pain meds (will request these medications when she wants them) I/D:   GBS negative Anticipated MOD:  NSVD  07/12/22, CNM 07/11/2022, 11:13 AM

## 2022-07-11 NOTE — Progress Notes (Signed)
Labor Progress Note  Brianna Booth is a 36 y.o. [id number] at [redacted]w[redacted]d by LMP admitted for induction of labor due to pre-eclampsia with severe features.  Subjective: she is sleeping but easy to rouse, reports headache is still present, occasional contractions   Objective: BP (!) 140/66   Pulse 79   Temp 98.3 F (36.8 C) (Oral)   Resp 18   Ht 5\' 7"  (1.702 m)   Wt 119.7 kg   LMP 11/26/2021   SpO2 95%   BMI 41.35 kg/m  Vitals:   07/11/22 0609 07/11/22 0709 07/11/22 0718 07/11/22 0821  BP: (!) 142/77 (!) 144/67 (!) 159/89 137/84   07/11/22 0908 07/11/22 1014 07/11/22 1118 07/11/22 1217  BP: (!) 148/66 129/61 131/74 124/66   07/11/22 1312 07/11/22 1431 07/11/22 1532 07/11/22 1632  BP: 136/64 (!) 151/89 138/75 (!) 140/66   Notable VS details: reviewed  Fetal Assessment: FHT:  FHR: 145 bpm, variability: moderate,  accelerations:  Abscent,  decelerations:  Absent Category/reactivity:  Category I UC:   irregular, every 5-8 minutes SVE:    Dilation: 2cm  Effacement: Long  Station:  -2  Consistency: soft  Position: middle  Membrane status:intact Amniotic color: n/a  Labs: Lab Results  Component Value Date   WBC 16.6 (H) 07/10/2022   HGB 12.1 07/10/2022   HCT 36.8 07/10/2022   MCV 89.1 07/10/2022   PLT 225 07/10/2022   I/O last 3 completed shifts: In: 846.8 [P.O.:660; I.V.:186.8] Out: 300 [Urine:300] Total I/O In: 1580.3 [P.O.:720; I.V.:860.3] Out: 2750 [Urine:2750]  Assessment / Plan: 36 year old 31 at [redacted]w[redacted]d transferred from [facility] in [place] for IOL for pre-eclampsia with severe features  Labor:  Has received 3 doses of cytotec. Now [place] catheter placed and pitocin will be started.  Preeclampsia:   on magnesium sulfate, intake and ouput balanced, and labs stable - sent over from [facility] for pre-eclampsia with severe features. Had intractable headache and severe-range blood pressures. Normal CT of the head. Received Tylenol, Oxycodone,  Phenergan, and Compazine at [facility] MFM recommended delivery for cHTN with superimposed pre-eclampsia with severe features and intractable headache.  - Patient has been on magnesium 2g/hr for almost 72hrs. Last magnesium level 07/11/22 @ 1711: 4.1 Fetal Wellbeing:  Category I Pain Control:   will request epidural when desires it I/D:   GBS negative, BOW intact Anticipated MOD:  NSVD  07/13/22, CNM 07/11/2022, 6:47 PM

## 2022-07-11 NOTE — Progress Notes (Signed)
Pt stated she "cannot breathe" with fetal monitor straps applied. She stated they were also causing her back pain. She requested RN only use belly band to secure monitors. 2 RNs continuously adjusting monitors attempting to maintain continuous fetal monitoring since arrival. Vigorous fetal movement heard on monitor. CNM aware.

## 2022-07-11 NOTE — Consult Note (Signed)
Asked by D. Andrey Campanile, CNM to provide updated prenatal consultation for this  36 y.o.  [id number] mother who is now [redacted]w[redacted]d with her pregnancy complicated by pre-eclampsia now that she has been transferred from [facility] at [facility] (NICU there is at capacity). She has been treated with betamethasone and is on anti-hypertensives.  I updated Ms. Conrad and FOB about usual expectations for preterm infant at [redacted] weeks gestation. They had previously spoken with Dr. Jilda Panda at [facility] on 12/21 and were well-informed of the various concerns, including possible needs for DR resuscitation, respiratory support, and IV access. I reiterated the plans and also discussed the importance of family involvement. FOB was enthusiastic (this will be his first child and will be named after him) and stated he would be present daily. I reminded them of criteria for discharge and projected possible length of stay until [facility] plus/minus.  Discussed advantages of feeding with mother's milk and possible use of donor milk as "bridge" if needed until her supply is sufficient.  Patient and FOB were attentive, had appropriate questions, and expressed appreciation for my input.  Thank you for consulting Neonatology.  Total time 45 minutes, face-to-face time 20 minutes  JWimmer, MD

## 2022-07-11 NOTE — H&P (Signed)
OB History & Physical   History of Present Illness:  Chief Complaint:   HPI:  Brianna Booth is a 36 y.o. [id number] female at [redacted]w[redacted]d.  She was transferred from [facility] to [facility] L&D for IOL and management of Pre-E with severe features.   She reports:  -active fetal movement -no leakage of fluid -no vaginal bleeding -no contractions  Pregnancy Issues: 1. CHTN with superimposed severe Pre-E 2. AMA 3. 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 H/o Pre-E 5. Bipolar and Schizoaffective disorder 6. Severe anxiety 7. Stable Asthma   Maternal Medical History:   Past Medical History:  Diagnosis Date   Anxiety    Asthma    Bipolar 1 disorder (HCC)    Depression    Essential hypertension 02/22/2019   Headache    Preterm labor    Schizo-affective schizophrenia (HCC)     Past Surgical History:  Procedure Laterality Date   BACK SURGERY     DILATION AND CURETTAGE OF UTERUS      No Known Allergies  Prior to Admission medications   Medication Sig Start Date End Date Taking? Authorizing Provider  albuterol (PROVENTIL HFA;VENTOLIN HFA) 108 (90 Base) MCG/ACT inhaler Inhale 1-2 puffs into the lungs every 6 (six) hours as needed for wheezing or shortness of breath.    [provider]  cyclobenzaprine (FLEXERIL) 10 MG tablet Take 1 tablet (10 mg total) by mouth 3 (three) times daily as needed for muscle spasms. 04/11/22   Judeth Horn, NP  Prenatal Multivit-Min-Fe-FA (PRENATAL VITAMINS) 0.8 MG tablet Take 1 tablet by mouth daily. 04/07/18   Raelyn Mora, CNM  promethazine (PHENERGAN) 25 MG tablet Take 1 tablet (25 mg total) by mouth every 6 (six) hours as needed for nausea or vomiting. 04/11/22   Judeth Horn, NP     Pren[facility] care site: [facility]  Social History: She  reports that she has been smoking cigarettes. She has never used smokeless tobacco. She reports that she does not currently use alcohol. She reports current drug use. Drug: Marijuana.  Family History: family history  includes Asthma in her maternal grandfather; Diabetes in her brother and maternal aunt; Hypertension in her father.   Review of Systems: A full review of systems was performed and negative except as noted in the HPI.    Physical Exam:  Vital Signs: BP 134/69   Pulse 78   Temp 98.3 F (36.8 C) (Oral)   Resp 18   Ht  (1.702 m)   Wt 119.7 kg   LMP 11/26/2021   SpO2 95%   BMI 41.35 kg/m   General:   alert and cooperative  Skin:  normal  Neurologic:    Alert & oriented x 3  Lungs:    Nl effort  Heart:   regular rate and rhythm  Abdomen:  soft, non-tender; bowel sounds normal; no masses,  no organomegaly  Extremities: : non-tender, symmetric, no edema bilaterally.      Results for orders placed or performed during the hospital encounter of 07/11/22 (from the past 24 hour(s))  Magnesium     Status: Abnormal   Collection Time: 07/11/22 12:30 PM  Result Value Ref Range   Magnesium 4.0 (H) 1.7 - 2.4 mg/dL  Magnesium     Status: Abnormal   Collection Time: 07/11/22  5:11 PM  Result Value Ref Range   Magnesium 4.1 (H) 1.7 - 2.4 mg/dL  Magnesium     Status: Abnormal   Collection Time: 07/11/22 10:28 PM  Result Value Ref Range  Magnesium 4.3 (H) 1.7 - 2.4 mg/dL  Magnesium     Status: Abnormal   Collection Time: 07/12/22  6:19 AM  Result Value Ref Range   Magnesium 4.0 (H) 1.7 - 2.4 mg/dL    Pertinent Results:  Prenatal Labs: Blood type/Rh O pos  Antibody screen neg  Rubella Immune  Varicella   RPR NR  HBsAg Neg  HIV NR  GC Neg  Chlamydia Neg  Genetic screening   1 hour GTT   3 hour GTT   GBS Neg   FHT: FHR: 140 bpm, variability: moderate,  accelerations:  Present,  decelerations:  Absent Category/reactivity:  Category I TOCO: none SVE: Dilation: 6 / Effacement (%): 70 / Station: -1      CT VENOGRAM HEAD  Result Date: 07/10/2022 CLINICAL DATA:  Initial evaluation for migraine headaches and pregnancy. EXAM: CT VENOGRAM HEAD TECHNIQUE: Venographic phase  images of the brain were obtained following the administration of intravenous contrast. Multiplanar reformats and maximum intensity projections were generated. RADIATION DOSE REDUCTION: This exam was performed according to the departmental dose-optimization program which includes automated exposure control, adjustment of the mA and/or kV according to patient size and/or use of iterative reconstruction technique. CONTRAST:  75mL OMNIPAQUE IOHEXOL 350 MG/ML SOLN COMPARISON:  None Available. FINDINGS: Brain: Cerebral volume within normal limits for patient age. No evidence for acute intracranial hemorrhage. No findings to suggest acute large vessel territory infarct. No mass lesion, midline shift, or mass effect. Ventricles are normal in size without evidence for hydrocephalus. No extra-axial fluid collection identified. Vascular: Prior to contrast administration, no abnormal hyperdense vessel is seen. Following contrast administration, normal enhancement is seen throughout the superior sagittal sinus to the torcula. Transverse and sigmoid sinuses are patent as are the jugular bulbs and visualized proximal internal jugular veins. Left transverse sinus slightly dominant. Straight sinus, vein of Galen, internal cerebral veins, and basal veins of Rosenthal appear patent. No appreciable abnormality about the cavernous sinus. Superior orbital veins symmetric. No visible cortical vein abnormality. No pathologic enhancement within the brain. Skull: Scalp soft tissues demonstrate no acute abnormality. Calvarium intact. Sinuses/Orbits: Globes and orbital soft tissues within normal limits. Scattered mucosal thickening present about the ethmoidal air cells and maxillary sinuses. Paranasal sinuses are otherwise clear. No mastoid effusion. IMPRESSION: 1. Normal CT venogram. No evidence for dural sinus thrombosis. 2. No other acute intracranial abnormality. Electronically Signed   By: Rise MuBenjamin  McClintock M.D.   On: 07/10/2022 19:37    CT HEAD WO CONTRAST (5MM)  Result Date: 07/08/2022 CLINICAL DATA:  Headache, pregnancy, preeclampsia EXAM: CT HEAD WITHOUT CONTRAST TECHNIQUE: Contiguous axial images were obtained from the base of the skull through the vertex without intravenous contrast. RADIATION DOSE REDUCTION: This exam was performed according to the departmental dose-optimization program which includes automated exposure control, adjustment of the mA and/or kV according to patient size and/or use of iterative reconstruction technique. COMPARISON:  None Available. FINDINGS: Brain: No evidence of acute infarction, hemorrhage, hydrocephalus, extra-axial collection or mass lesion/mass effect. Vascular: No hyperdense vessel or unexpected calcification. Skull: Normal. Negative for fracture or focal lesion. Sinuses/Orbits: No acute finding. Other: None. IMPRESSION: No acute intracranial pathology. Electronically Signed   By: Jearld LeschAlex D Bibbey M.D.   On: 07/08/2022 12:59   DG CHEST PORT 1 VIEW  Result Date: 07/08/2022 CLINICAL DATA:  Shortness of breath. Emesis during pregnancy, headache, contractions. EXAM: PORTABLE CHEST 1 VIEW COMPARISON:  04/16/2022. FINDINGS: The heart is borderline enlarged and mediastinal contours are within normal limits. Both lungs  are clear. No acute osseous abnormality. IMPRESSION: No active disease. Electronically Signed   By: Thornell Sartorius M.D.   On: 07/08/2022 01:32   US Abdomen Limited RUQ (LIVER/GB)  Result Date: 07/08/2022 CLINICAL DATA:  Right upper quadrant pain EXAM: ULTRASOUND ABDOMEN LIMITED RIGHT UPPER QUADRANT COMPARISON:  02/19/2018 abdominal ultrasound FINDINGS: Gallbladder: The gallbladder is contracted, limiting evaluation. The wall measures 3 mm, the upper limit of normal. A positive sonographic Eulah Pont sign is noted by sonographer. No pericholecystic fluid. No sludge or stones. Common bile duct: Diameter: 4 mm, within normal limits. No intrahepatic biliary ductal dilatation. Liver: No focal  lesion identified. Within normal limits in parenchymal echogenicity. Portal vein is patent on color Doppler imaging with normal direction of blood flow towards the liver. Other: None. IMPRESSION: The gallbladder is contracted, limiting evaluation. A positive sonographic Eulah Pont sign is noted by sonographer. No other signs of acute cholecystitis. Correlate with lab values and clinical appearance. Electronically Signed   By: Wiliam Ke M.D.   On: 07/08/2022 01:04   [place] MFM OB FOLLOW UP  Result Date: 07/07/2022 ----------------------------------------------------------------------  OBSTETRICS REPORT                       (Signed Final 07/07/2022 12:56 pm) ---------------------------------------------------------------------- Patient Info  ID #:       [id number]                          D.O.B.:  22-Feb-1986 (36 yrs)  Name:       Brianna Booth                  Visit Date: 07/07/2022 12:03 pm ---------------------------------------------------------------------- Performed By  Attending:        Ma Rings MD         Ref. Address:     [address] [place], [place]  Performed By:     Fayne Norrie BS,      [facility]  Referred By:      [facility] ---------------------------------------------------------------------- Orders  #  Description                           Code        Ordered By  1  [place] MFM OB FOLLOW UP                   10175.10    RAVI SHANKAR ----------------------------------------------------------------------  #  Order #                     Accession #                Episode #  1  [id number]                   [id number]                 [id number]  ---------------------------------------------------------------------- Indications  Poor obstetric history: Previous               O09.299  preeclampsia / eclampsia/gestational HTN  Advanced maternal age multigravida 25+,        O4.522  second trimester  Poor obstetric history: Previous preterm       O09.219  delivery, antepartum  Obesity complicating pregnancy, second         O99.212  trimester  Hypertension - Chronic/Pre-existing (no        O10.019  meds)  [redacted] weeks gestation of pregnancy                Z3A.31  LR Female ---------------------------------------------------------------------- Fetal Evaluation  Num Of Fetuses:         1  Fetal Heart Rate(bpm):  145  Cardiac Activity:       Observed  Presentation:           Cephalic  Placenta:               Anterior  P. Cord Insertion:      Previously Visualized  Amniotic Fluid  AFI FV:      Within normal limits  AFI Sum(cm)     %Tile       Largest Pocket(cm)  11.6            28          3.9  RUQ(cm)       RLQ(cm)       LUQ(cm)        LLQ(cm)  3.7           1.3           3.9            2.7 ---------------------------------------------------------------------- Biometry  BPD:      82.4  mm     G. Age:  33w 1d         78  %    CI:        74.81   %    70 - 86                                                          FL/HC:      19.1   %    19.1 - 21.3  HC:      302.3  mm  G. Age:  33w 4d         60  %    HC/AC:      1.02        0.96 - 1.17  AC:      297.1  mm     G. Age:  33w 5d         92  %    FL/BPD:     70.0   %    71 - 87  FL:       57.7  mm     G. Age:  30w 1d          6  %    FL/AC:      19.4   %    20 - 24  Est. FW:    2016  gm      4 lb 7 oz     65  % ---------------------------------------------------------------------- OB History  Gravidity:    14        Term:   3        Prem:   5        SAB:   3  TOP:          2        Living:  8 ---------------------------------------------------------------------- Gestational Age  LMP:           31w 6d        Date:  11/26/21                   EDD:   09/02/22  U/S Today:     32w 5d                                        EDD:   08/27/22  Best:          31w 6d     Det. By:  LMP  (11/26/21)          EDD:   09/02/22 ---------------------------------------------------------------------- Anatomy  Cranium:               Appears normal         LVOT:                   Appears normal  Cavum:                 Appears normal         Aortic Arch:            Previously seen  Ventricles:            Previously seen        Ductal Arch:            Previously seen  Choroid Plexus:        Previously seen        Diaphragm:              Previously seen  Cerebellum:            Previously seen        Stomach:                Appears normal, left  sided  Posterior Fossa:       Previously seen        Abdomen:                Previously seen  Nuchal Fold:           Not applicable (>20    Abdominal Wall:         Previously seen                         wks GA)  Face:                  Orbits and profile     Cord Vessels:           Previously seen                         previously seen  Lips:                  Previously seen        Kidneys:                Appear normal  Palate:                Not well visualized    Bladder:                Appears normal  Thoracic:              Previously seen        Spine:                  Not well visualized  Heart:                 Appears normal         Upper Extremities:      Previously seen                         (4CH, axis, and                         situs)  RVOT:                  Previously seen        Lower Extremities:      Previously seen  Other:  Previously fetus appears to be a female. Nasal bone, Lenses, VC, 3VV          and 3VTV previously visualized. Technically difficult due to maternal          habitus and fetal position. ---------------------------------------------------------------------- Cervix Uterus Adnexa  Cervix  Not visualized (advanced GA >24wks)  ---------------------------------------------------------------------- Comments  This patient was seen for a follow up growth scan due to  advanced maternal age and maternal obesity.  She denies  any problems since her last exam.  She was informed that the fetal growth and amniotic fluid  level appears appropriate for her gestational age.  As the fetal growth is within normal limits, no further exams  were scheduled in our office. ----------------------------------------------------------------------                   Ma Rings, MD Electronically Signed Final Report   07/07/2022 12:56 pm ----------------------------------------------------------------------    Assessment:  Brianna Booth is a 36 y.o. [id number] female at [redacted]w[redacted]d with FGR and CHTN with superimposed severe  pre-E.   Plan:  1. Admit to Labor & Delivery; consents reviewed and obtained  2. Fetal Well being  - BMX given 07/09/22 at 1033 - Fetal Tracing: Cat I - GBS collected - Presentation: vtx confirmed by [place]   3. CHTN with superimposed Pre-E - 07/08/22   PCR 30; Plt WNL; AST/ALT WNL; Cr WNL    24hr Urine protein   too low - BP on admission 171/103 rechecked 147/85 - Procardia XL  BID  - Currently on Magnesium Sulfate 2g/hr - Magnesium level - 07/10/22  =  5.3 - Rescue Labetalol ordered   4. Routine OB: - Prenatal labs reviewed, as above - Rh Pos - Clear fluids, IVF  5. Induction of Labor -  Contractions by external toco in place -  Plan for induction with cytotec  -  Plan for continuous fetal monitoring  -  Maternal pain control as desired: IVPM, nitrous, regional anesthesia - Anticipate vaginal delivery  6. Bipolar / Schizoaffective disorder / Severe anxiety - Lamictal  DailyHS - Abilify  DailyHS  7. Asthma and Current Upper respiratory virus - Albuterol ordered PRN - Mucinex BID PRN  8. Post Partum Planning: - Infant feeding: Breastfeeding - Contraception: Depo  Haroldine Laws, CNM 07/12/2022  10:31 AM

## 2022-07-11 NOTE — Progress Notes (Signed)
Labor Progress Note  Brianna Booth is a 36 y.o. [id number] at [redacted]w[redacted]d by LMP admitted for induction of labor due to pre-eclampsia with severe features.  Subjective: she reports her headache is present and she is having photophobia. She also reports the cramps are more uncomfortable, rated 5/10.  Objective: BP 124/66 (BP Location: Right Arm)   Pulse 81   Temp 98.2 F (36.8 C) (Oral)   Resp 18   Ht 5\' 7"  (1.702 m)   Wt 119.7 kg   LMP 11/26/2021   SpO2 97%   BMI 41.35 kg/m   Vitals:   07/11/22 0211 07/11/22 0313 07/11/22 0356 07/11/22 0509  BP: (!) 144/81 135/65 139/83 (!) 148/77   07/11/22 0609 07/11/22 0709 07/11/22 0718 07/11/22 0821  BP: (!) 142/77 (!) 144/67 (!) 159/89 137/84   07/11/22 0908 07/11/22 1014 07/11/22 1118 07/11/22 1217  BP: (!) 148/66 129/61 131/74 124/66    Notable VS details: reviewed  Fetal Assessment: FHT:  FHR: 140 bpm, variability: moderate,  accelerations:  Abscent,  decelerations:  Absent Category/reactivity:  Category II UC:   irregular, every 3-5 minutes SVE:    Dilation: 2cm  Effacement: Long  Station:  -3  Consistency: soft  Position: posterior  Membrane status:intact Amniotic color: n/a  Labs: Lab Results  Component Value Date   WBC 16.6 (H) 07/10/2022   HGB 12.1 07/10/2022   HCT 36.8 07/10/2022   MCV 89.1 07/10/2022   PLT 225 07/10/2022   I/O last 3 completed shifts: In: 846.8 [P.O.:660; I.V.:186.8] Out: 300 [Urine:300] Total I/O In: 848.8 [P.O.:360; I.V.:488.8] Out: 1750 [Urine:1750]   Assessment / Plan: 36 year old 31 at [redacted]w[redacted]d transferred from [facility] in [place] for IOL for pre-eclampsia with severe features  Labor:  progressing normally on cytotec, received 3 doses, good cervical change. She is becoming more uncomfortable. Preeclampsia:   on magnesium sulfate, intake and ouput balanced, and labs stable - sent over from [facility] for pre-eclampsia with severe features. Had intractable headache and  severe-range blood pressures. Normal CT of the head. Received Tylenol, Oxycodone, Phenergan, and Compazine at [facility]. MFM recommended delivery for cHTN with superimposed pre-eclampsia with severe features and intractable headache.  - Patient has been on magnesium 2g/hr for almost 72hrs. Last magnesium level 07/11/22 @ 1230 4.0 Fetal Wellbeing:  Category II Pain Control:   will request epidural when contractions are stronger I/D:   GBS negative Anticipated MOD:  NSVD  07/13/22, CNM 07/11/2022, 1:09 PM

## 2022-07-12 ENCOUNTER — Encounter: Payer: Self-pay | Admitting: Obstetrics and Gynecology

## 2022-07-12 ENCOUNTER — Encounter
Admission: AD | Disposition: A | Discharge status: Home / Self Care | Payer: Self-pay | Source: Home / Self Care | Attending: Certified Nurse Midwife

## 2022-07-12 ENCOUNTER — Other Ambulatory Visit: Payer: Self-pay

## 2022-07-12 HISTORY — PX: DILATION AND CURETTAGE OF UTERUS: SHX78

## 2022-07-12 LAB — MAGNESIUM
Magnesium: 4 mg/dL — ABNORMAL HIGH (ref 1.7–2.4)
Magnesium: 3.9 mg/dL — ABNORMAL HIGH (ref 1.7–2.4)
Magnesium: 2.6 mg/dL — ABNORMAL HIGH (ref 1.7–2.4)

## 2022-07-12 LAB — CBC WITH DIFFERENTIAL/PLATELET
Abs Immature Granulocytes: 0.17 10*3/uL — ABNORMAL HIGH (ref 0.00–0.07)
Basophils Absolute: 0.1 10*3/uL (ref 0.0–0.1)
Basophils Relative: 0 %
Eosinophils Absolute: 0 10*3/uL (ref 0.0–0.5)
Eosinophils Relative: 0 %
HCT: 38 % (ref 36.0–46.0)
Hemoglobin: 12.8 g/dL (ref 12.0–15.0)
Immature Granulocytes: 1 %
Lymphocytes Relative: 11 %
Lymphs Abs: 2.4 10*3/uL (ref 0.7–4.0)
MCH: 29 pg (ref 26.0–34.0)
MCHC: 33.7 g/dL (ref 30.0–36.0)
MCV: 86.2 fL (ref 80.0–100.0)
Monocytes Absolute: 1 10*3/uL (ref 0.1–1.0)
Monocytes Relative: 4 %
Neutro Abs: 18.7 10*3/uL — ABNORMAL HIGH (ref 1.7–7.7)
Neutrophils Relative %: 84 %
Platelets: 219 10*3/uL (ref 150–400)
RBC: 4.41 MIL/uL (ref 3.87–5.11)
RDW: 14.6 % (ref 11.5–15.5)
WBC: 22.4 10*3/uL — ABNORMAL HIGH (ref 4.0–10.5)
nRBC: 0 % (ref 0.0–0.2)

## 2022-07-12 LAB — PREPARE RBC (CROSSMATCH)

## 2022-07-12 SURGERY — DILATION AND CURETTAGE
Anesthesia: Epidural

## 2022-07-12 MED ORDER — LACTATED RINGERS IV SOLN: INTRAVENOUS | Status: DC | PRN

## 2022-07-12 MED ORDER — ONDANSETRON HCL 4 MG/2ML IJ SOLN
INTRAMUSCULAR | Status: DC | PRN
  Administered 2022-07-12: 4 mg via INTRAVENOUS

## 2022-07-12 MED ORDER — ONDANSETRON HCL 4 MG/2ML IJ SOLN
4.0000 mg | INTRAMUSCULAR | Status: DC | PRN
  Administered 2022-07-13 (×2): 4 mg via INTRAVENOUS
  Filled 2022-07-12 (×2): qty 2

## 2022-07-12 MED ORDER — SILVER NITRATE-POT NITRATE 75-25 % EX MISC
CUTANEOUS | Status: DC | PRN
  Administered 2022-07-12: 2

## 2022-07-12 MED ORDER — ACETAMINOPHEN 325 MG PO TABS
650.0000 mg | ORAL_TABLET | ORAL | Status: DC | PRN
  Administered 2022-07-15: 650 mg via ORAL
  Filled 2022-07-12: qty 2

## 2022-07-12 MED ORDER — LABETALOL HCL 5 MG/ML IV SOLN: 80.0000 mg | INTRAVENOUS | Status: DC | PRN

## 2022-07-12 MED ORDER — OXYCODONE HCL 5 MG PO TABS: 5.0000 mg | ORAL_TABLET | Freq: Once | ORAL | Status: DC | PRN

## 2022-07-12 MED ORDER — COCONUT OIL OIL: 1.0000 | TOPICAL_OIL | Status: DC | PRN

## 2022-07-12 MED ORDER — LABETALOL HCL 5 MG/ML IV SOLN: 40.0000 mg | INTRAVENOUS | Status: DC | PRN

## 2022-07-12 MED ORDER — CEFAZOLIN SODIUM-DEXTROSE 2-4 GM/100ML-% IV SOLN
INTRAVENOUS | Status: AC
  Filled 2022-07-12: qty 100

## 2022-07-12 MED ORDER — LIDOCAINE HCL (PF) 2 % IJ SOLN
INTRAMUSCULAR | Status: AC
  Filled 2022-07-12: qty 5

## 2022-07-12 MED ORDER — DEXAMETHASONE SODIUM PHOSPHATE 10 MG/ML IJ SOLN
INTRAMUSCULAR | Status: AC
  Filled 2022-07-12: qty 1

## 2022-07-12 MED ORDER — SIMETHICONE 80 MG PO CHEW: 80.0000 mg | CHEWABLE_TABLET | ORAL | Status: DC | PRN

## 2022-07-12 MED ORDER — ALBUMIN HUMAN 5 % IV SOLN: INTRAVENOUS | Status: DC | PRN

## 2022-07-12 MED ORDER — FENTANYL CITRATE (PF) 100 MCG/2ML IJ SOLN
INTRAMUSCULAR | Status: AC
  Administered 2022-07-12: 50 ug via INTRAVENOUS
  Filled 2022-07-12: qty 2

## 2022-07-12 MED ORDER — ALBUTEROL SULFATE (2.5 MG/3ML) 0.083% IN NEBU: 2.5000 mg | INHALATION_SOLUTION | Freq: Four times a day (QID) | RESPIRATORY_TRACT | Status: DC | PRN

## 2022-07-12 MED ORDER — LABETALOL HCL 5 MG/ML IV SOLN
40.0000 mg | INTRAVENOUS | Status: DC | PRN
  Administered 2022-07-13: 40 mg via INTRAVENOUS
  Filled 2022-07-12: qty 8

## 2022-07-12 MED ORDER — ACETAMINOPHEN 10 MG/ML IV SOLN: 1000.0000 mg | Freq: Once | INTRAVENOUS | Status: DC | PRN

## 2022-07-12 MED ORDER — FENTANYL CITRATE (PF) 100 MCG/2ML IJ SOLN: 25.0000 ug | INTRAMUSCULAR | Status: DC | PRN

## 2022-07-12 MED ORDER — TRANEXAMIC ACID-NACL 1000-0.7 MG/100ML-% IV SOLN
INTRAVENOUS | Status: DC | PRN
  Administered 2022-07-12: 1000 mg via INTRAVENOUS

## 2022-07-12 MED ORDER — 0.9 % SODIUM CHLORIDE (POUR BTL) OPTIME
TOPICAL | Status: DC | PRN
  Administered 2022-07-12: 500 mL

## 2022-07-12 MED ORDER — OXYCODONE HCL 5 MG/5ML PO SOLN: 5.0000 mg | Freq: Once | ORAL | Status: DC | PRN

## 2022-07-12 MED ORDER — PROPOFOL 10 MG/ML IV BOLUS
INTRAVENOUS | Status: DC | PRN
  Administered 2022-07-12: 200 mg via INTRAVENOUS

## 2022-07-12 MED ORDER — PHENYLEPHRINE HCL (PRESSORS) 10 MG/ML IV SOLN
INTRAVENOUS | Status: AC
  Filled 2022-07-12: qty 1

## 2022-07-12 MED ORDER — EPHEDRINE SULFATE (PRESSORS) 50 MG/ML IJ SOLN
INTRAMUSCULAR | Status: DC | PRN
  Administered 2022-07-12: 25 mg via INTRAVENOUS

## 2022-07-12 MED ORDER — TRANEXAMIC ACID-NACL 1000-0.7 MG/100ML-% IV SOLN
INTRAVENOUS | Status: AC
  Filled 2022-07-12: qty 100

## 2022-07-12 MED ORDER — FENTANYL CITRATE (PF) 100 MCG/2ML IJ SOLN
INTRAMUSCULAR | Status: AC
  Filled 2022-07-12: qty 2

## 2022-07-12 MED ORDER — PROPOFOL 10 MG/ML IV BOLUS
INTRAVENOUS | Status: AC
  Filled 2022-07-12: qty 20

## 2022-07-12 MED ORDER — BENZOCAINE-MENTHOL 20-0.5 % EX AERO
1.0000 | INHALATION_SPRAY | CUTANEOUS | Status: DC | PRN
  Filled 2022-07-12: qty 56

## 2022-07-12 MED ORDER — DIPHENHYDRAMINE HCL 25 MG PO CAPS: 25.0000 mg | ORAL_CAPSULE | Freq: Four times a day (QID) | ORAL | Status: DC | PRN

## 2022-07-12 MED ORDER — SUCCINYLCHOLINE CHLORIDE 200 MG/10ML IV SOSY
PREFILLED_SYRINGE | INTRAVENOUS | Status: DC | PRN
  Administered 2022-07-12: 100 mg via INTRAVENOUS

## 2022-07-12 MED ORDER — MIDAZOLAM HCL 2 MG/2ML IJ SOLN
INTRAMUSCULAR | Status: DC | PRN
  Administered 2022-07-12: 2 mg via INTRAVENOUS

## 2022-07-12 MED ORDER — DIBUCAINE (PERIANAL) 1 % EX OINT: 1.0000 | TOPICAL_OINTMENT | CUTANEOUS | Status: DC | PRN

## 2022-07-12 MED ORDER — LIDOCAINE HCL (CARDIAC) PF 100 MG/5ML IV SOSY
PREFILLED_SYRINGE | INTRAVENOUS | Status: DC | PRN
  Administered 2022-07-12: 100 mg via INTRAVENOUS

## 2022-07-12 MED ORDER — SENNOSIDES-DOCUSATE SODIUM 8.6-50 MG PO TABS
2.0000 | ORAL_TABLET | ORAL | Status: DC
  Administered 2022-07-12 – 2022-07-14 (×3): 2 via ORAL
  Filled 2022-07-12 (×3): qty 2

## 2022-07-12 MED ORDER — HYDRALAZINE HCL 20 MG/ML IJ SOLN: 10.0000 mg | INTRAMUSCULAR | Status: DC | PRN

## 2022-07-12 MED ORDER — PHENYLEPHRINE 80 MCG/ML (10ML) SYRINGE FOR IV PUSH (FOR BLOOD PRESSURE SUPPORT)
PREFILLED_SYRINGE | INTRAVENOUS | Status: DC | PRN
  Administered 2022-07-12: 160 ug via INTRAVENOUS
  Administered 2022-07-12: 240 ug via INTRAVENOUS
  Administered 2022-07-12: 160 ug via INTRAVENOUS

## 2022-07-12 MED ORDER — LACTATED RINGERS IV SOLN: INTRAVENOUS | Status: DC

## 2022-07-12 MED ORDER — ONDANSETRON HCL 4 MG/2ML IJ SOLN
INTRAMUSCULAR | Status: AC
  Filled 2022-07-12: qty 2

## 2022-07-12 MED ORDER — CALCIUM CARBONATE ANTACID 500 MG PO CHEW
400.0000 mg | CHEWABLE_TABLET | Freq: Three times a day (TID) | ORAL | Status: DC | PRN
  Administered 2022-07-12 (×2): 400 mg via ORAL
  Filled 2022-07-12 (×2): qty 2

## 2022-07-12 MED ORDER — PRENATAL MULTIVITAMIN CH
1.0000 | ORAL_TABLET | Freq: Every day | ORAL | Status: DC
  Administered 2022-07-13 – 2022-07-15 (×3): 1 via ORAL
  Filled 2022-07-12 (×3): qty 1

## 2022-07-12 MED ORDER — LABETALOL HCL 5 MG/ML IV SOLN: 20.0000 mg | INTRAVENOUS | Status: DC | PRN

## 2022-07-12 MED ORDER — MAGNESIUM SULFATE BOLUS VIA INFUSION
4.0000 g | Freq: Once | INTRAVENOUS | Status: AC
  Administered 2022-07-12 (×2): 4 g via INTRAVENOUS
  Filled 2022-07-12: qty 1000

## 2022-07-12 MED ORDER — SOD CITRATE-CITRIC ACID 500-334 MG/5ML PO SOLN
ORAL | Status: AC
  Filled 2022-07-12: qty 15

## 2022-07-12 MED ORDER — SUCCINYLCHOLINE CHLORIDE 200 MG/10ML IV SOSY
PREFILLED_SYRINGE | INTRAVENOUS | Status: AC
  Filled 2022-07-12: qty 10

## 2022-07-12 MED ORDER — PHENYLEPHRINE HCL-NACL 20-0.9 MG/250ML-% IV SOLN
INTRAVENOUS | Status: AC
  Filled 2022-07-12: qty 250

## 2022-07-12 MED ORDER — OXYCODONE HCL 5 MG PO TABS
5.0000 mg | ORAL_TABLET | ORAL | Status: DC | PRN
  Administered 2022-07-12 – 2022-07-15 (×9): 5 mg via ORAL
  Filled 2022-07-12 (×10): qty 1

## 2022-07-12 MED ORDER — ALBUMIN HUMAN 5 % IV SOLN
INTRAVENOUS | Status: AC
  Filled 2022-07-12: qty 500

## 2022-07-12 MED ORDER — FENTANYL-BUPIVACAINE-NACL 0.5-0.125-0.9 MG/250ML-% EP SOLN
EPIDURAL | Status: AC
  Filled 2022-07-12: qty 250

## 2022-07-12 MED ORDER — ONDANSETRON HCL 4 MG PO TABS: 4.0000 mg | ORAL_TABLET | ORAL | Status: DC | PRN

## 2022-07-12 MED ORDER — PHENYLEPHRINE HCL-NACL 20-0.9 MG/250ML-% IV SOLN
INTRAVENOUS | Status: DC | PRN
  Administered 2022-07-12: 50 ug/min via INTRAVENOUS

## 2022-07-12 MED ORDER — ACETAMINOPHEN 10 MG/ML IV SOLN
INTRAVENOUS | Status: AC
  Administered 2022-07-12: 1000 mg via INTRAVENOUS
  Filled 2022-07-12: qty 100

## 2022-07-12 MED ORDER — POVIDONE-IODINE 10 % EX SWAB: 2.0000 | Freq: Once | CUTANEOUS | Status: DC

## 2022-07-12 MED ORDER — CEFAZOLIN SODIUM-DEXTROSE 2-4 GM/100ML-% IV SOLN
2.0000 g | INTRAVENOUS | Status: AC
  Administered 2022-07-12: 2 g via INTRAVENOUS

## 2022-07-12 MED ORDER — ONDANSETRON HCL 4 MG/2ML IJ SOLN: 4.0000 mg | Freq: Once | INTRAMUSCULAR | Status: DC | PRN

## 2022-07-12 MED ORDER — FERROUS SULFATE 325 (65 FE) MG PO TABS
325.0000 mg | ORAL_TABLET | Freq: Two times a day (BID) | ORAL | Status: DC
  Administered 2022-07-13 – 2022-07-15 (×5): 325 mg via ORAL
  Filled 2022-07-12 (×5): qty 1

## 2022-07-12 MED ORDER — IBUPROFEN 600 MG PO TABS
600.0000 mg | ORAL_TABLET | Freq: Four times a day (QID) | ORAL | Status: DC
  Administered 2022-07-12 – 2022-07-15 (×11): 600 mg via ORAL
  Filled 2022-07-12 (×11): qty 1

## 2022-07-12 MED ORDER — MAGNESIUM SULFATE 40 GM/1000ML IV SOLN
2.0000 g/h | INTRAVENOUS | Status: DC
  Filled 2022-07-12: qty 1000

## 2022-07-12 MED ORDER — LABETALOL HCL 5 MG/ML IV SOLN
20.0000 mg | INTRAVENOUS | Status: DC | PRN
  Administered 2022-07-13 (×2): 20 mg via INTRAVENOUS
  Filled 2022-07-12 (×2): qty 4

## 2022-07-12 MED ORDER — SODIUM CHLORIDE 0.9 % IV SOLN: 10.0000 mL/h | Freq: Once | INTRAVENOUS | Status: DC

## 2022-07-12 MED ORDER — WITCH HAZEL-GLYCERIN EX PADS
1.0000 | MEDICATED_PAD | CUTANEOUS | Status: DC | PRN
  Filled 2022-07-12: qty 100

## 2022-07-12 MED ORDER — MISOPROSTOL 200 MCG PO TABS: 800.0000 ug | ORAL_TABLET | Freq: Once | ORAL | Status: AC

## 2022-07-12 MED ORDER — MIDAZOLAM HCL 2 MG/2ML IJ SOLN
INTRAMUSCULAR | Status: AC
  Filled 2022-07-12: qty 2

## 2022-07-12 MED ORDER — ALBUTEROL SULFATE HFA 108 (90 BASE) MCG/ACT IN AERS
1.0000 | INHALATION_SPRAY | Freq: Four times a day (QID) | RESPIRATORY_TRACT | Status: DC | PRN
  Filled 2022-07-12: qty 6.7

## 2022-07-12 MED ORDER — DEXAMETHASONE SODIUM PHOSPHATE 10 MG/ML IJ SOLN
INTRAMUSCULAR | Status: DC | PRN
  Administered 2022-07-12: 10 mg via INTRAVENOUS

## 2022-07-12 MED ORDER — SODIUM CHLORIDE 0.9% IV SOLUTION: Freq: Once | INTRAVENOUS | Status: DC

## 2022-07-12 SURGICAL SUPPLY — 22 items
BAG URINE DRAIN 2000ML AR STRL (UROLOGICAL SUPPLIES) IMPLANT
CATH FOLEY 2WAY  5CC 16FR (CATHETERS)
CATH URTH 16FR FL 2W BLN LF (CATHETERS) IMPLANT
DRSG TELFA 3X8 NADH STRL (GAUZE/BANDAGES/DRESSINGS) IMPLANT
GLOVE BIO SURGEON STRL SZ7 (GLOVE) ×1 IMPLANT
GOWN STRL REUS W/ TWL LRG LVL3 (GOWN DISPOSABLE) ×2 IMPLANT
GOWN STRL REUS W/TWL LRG LVL3 (GOWN DISPOSABLE) ×2
KIT TURNOVER CYSTO (KITS) ×1 IMPLANT
MANIFOLD NEPTUNE II (INSTRUMENTS) ×1 IMPLANT
NS IRRIG 500ML POUR BTL (IV SOLUTION) ×1 IMPLANT
PACK DNC HYST (MISCELLANEOUS) ×1 IMPLANT
PAD OB MATERNITY 4.3X12.25 (PERSONAL CARE ITEMS) ×1 IMPLANT
PAD PREP 24X41 OB/GYN DISP (PERSONAL CARE ITEMS) ×1 IMPLANT
SCRUB CHG 4% DYNA-HEX 4OZ (MISCELLANEOUS) ×1 IMPLANT
SET BERKELEY SUCTION TUBING (SUCTIONS) ×1 IMPLANT
SET CYSTO W/LG BORE CLAMP LF (SET/KITS/TRAYS/PACK) IMPLANT
TOWEL OR 17X26 4PK STRL BLUE (TOWEL DISPOSABLE) ×1 IMPLANT
TRAP FLUID SMOKE EVACUATOR (MISCELLANEOUS) ×1 IMPLANT
VACURETTE 10 RIGID CVD (CANNULA) ×1 IMPLANT
VACURETTE 12 RIGID CVD (CANNULA) ×1 IMPLANT
VACURETTE 8 RIGID CVD (CANNULA) ×1 IMPLANT
WATER STERILE IRR 500ML POUR (IV SOLUTION) ×1 IMPLANT

## 2022-07-12 NOTE — Op Note (Signed)
  Operative Note    Name: Brianna Booth  Date of Service: 07/12/2022  DOB: Sep 08, 1985  MRN: [id number]   Pre-Operative Diagnosis: Retained placenta  Post-Operative Diagnosis: Retained placenta  Procedures:  Dilation and curettage  Primary Surgeon: Thomasene Mohair, MD  EBL: 500 mL intraoperatively (EBL total ~ 2,000 mL)  IVF: 400 mL crystalloid, 250 mL colloid (albumin), 2 units pRBCs  Urine output: unmeasured   Specimens: placenta and membranes  Drains: Foley catheter  Complications: None   Disposition: PACU   Condition: Stable   Findings: Abnormally adherent placenta mostly at the right fundus, though able to be separated digitally with mild pressure.  Thin endometrial lining on ultrasound and gritty texture after curettage.  Procedure Summary:  The patient was taken to the operative room where anesthesia was induced without difficulty. She was prepped and draped in the usual, sterile fashion.  After a time-out was called a sterile catheter was placed in the bladder.   Assessment of the placenta was performed manually. It appeared to be abnormally adherent to the right cornual region and a myometrial band had formed around this area.  Gentle traction was placed on the umbilical cord and I was able to gently create a plane between the placenta and the uterus.  With further release more placenta could accessed and was able to be gently separated from the uterus.  Eventually the placenta was removed.  It did not appear completely intact, though it was hard to tell because it was removed in the manner required.  The speculum was placed in the vagina and the anterior lip of the cervix was visualized and grasped with the single-tooth tenaculum.  Under ultrasound guidance a large curettage was utilized to clear any further tissue in the region. Curettage was performed throughout the uterus with a thin endometrial lining noted.  Fundal massage revealed normal, expected lochia.  She was  monitored for about 15 minutes and she continued to have minimal lochia with strong fundal massage and she maintained a firm uterine fundus.  Once this was completed, the tenaculum was removed from the cervix and silver nitrate was applied to the tenaculum entry sites and hemostasis was noted.  The vagina was checked for remaining instruments and sponges and none were present.    The magnesium sulfate was discontinued during the procedure. The pitocin was discontinued for a short while until the placenta was removed. This was restarted again. She also received TXA.  As above, she received albumin and 2 units pRBCs during the procedure.   The patient tolerated the procedure well.  Sponge, lap, needle, and instrument counts were correct x 2.  VTE prophylaxis: SCDs. Antibiotic prophylaxis: Ancef 2 grams IV prior to the start of the surgery. She was awakened in the operating room and was taken to the PACU in stable condition.   Thomasene Mohair, MD 07/12/2022 4:54 PM

## 2022-07-12 NOTE — Transfer of Care (Signed)
Immediate Anesthesia Transfer of Care Note  Patient: Brianna Booth  Procedure(s) Performed: DILATATION AND CURETTAGE  Patient Location: PACU  Anesthesia Type:General  Level of Consciousness: awake  Airway & Oxygen Therapy: Patient Spontanous Breathing  Post-op Assessment: Report given to RN  Post vital signs: stable  Last Vitals:  Vitals Value Taken Time  BP 118/74 07/12/22 1710  Temp 36.4 C 07/12/22 1710  Pulse 86 07/12/22 1713  Resp 18 07/12/22 1713  SpO2 100 % 07/12/22 1713  Vitals shown include unvalidated device data.  Last Pain:  Vitals:   07/12/22 1710  TempSrc: Temporal  PainSc:       Patients Stated Pain Goal: 0 (07/12/22 0716)  Complications: No notable events documented.

## 2022-07-12 NOTE — Progress Notes (Signed)
Labor Check  Subj:  Complaints: none. Denies current headache.  Starting to feel more contractions.  She dilated to 8 cm last night by about 9-10 PM. She has made minimal change in that time. She was even called 6 cm at one point with caput and cervical edema. She had Benadryl and Tums and has had multiple baby-spinning maneuvers.  She has had a pitocin break for about 2 hours and the pitocin has now been restarted and is at 6 mUnits/minute.  She is starting to feel the ctx again.  Her MVUs had been adequate. They are not currently adequate.     Obj:  BP 128/61   Pulse 77   Temp 98.3 F (36.8 C) (Oral)   Resp 18   Ht 5\' 7"  (1.702 m)   Wt 119.7 kg   LMP 11/26/2021   SpO2 95%   BMI 41.35 kg/m  Dose (milli-units/min) Oxytocin: 6 milli-units/min  Cervix: Dilation: 6 / Effacement (%): 70 / Station: -1  Baseline FHR: 140 beats/min   Variability: moderate   Accelerations: present (10 x 10)  Decelerations: present (occasional early variables, variables decelerations, shallow with quick return to baseline). Contractions: present frequency: 3-4 q 10 minutes.  MVUs low.  Overall assessment: cat 2 (mostly category 1 with an overall reassuring pattern).  Female chaperone present for pelvic exam:   Bedside ultrasound shows fetus in cephalic with apparently vertex presentation, appears transverse, but higher in pelvis.   A/P: 36 y.o. 31 female at [redacted]w[redacted]d with superimposed severe preeclampsia diagnosed by blood pressure and intractable headaches with workup by neurology at [facility] and cleared after head imaging.  1.  Labor: restarting pitocin.  We discussed the labor curve and that this is not a normal pattern, especially since this is her 9th delivery and this baby is likely smaller than most of her previous ones.  She strongly wants a vaginal delivery.  We discussed increasing the pitocin dosing up until she has adequate MVUs.  If she makes change and moves forward down the labor path, then we'll  continue. However, if she has adequate MVUs and is not changing and/or she develops signs of infection, we discussed moving forward with c-section.  We will continue to watch her very carefully. All questions answered.  2.  FWB: reassuring overall, Overall assessment: category 2 (but mostly category 1 and baby easily resuscitated to category 1)   3.  GBS negative by PCR  4.  Pain: epidural 5.  Recheck: once adequate or prn   [facility], MD, [facility] 07/12/2022 9:26 AM

## 2022-07-12 NOTE — Progress Notes (Signed)
S: Reviewed lab results and output with Dr. Gerilyn Nestle: Vitals:   07/12/22 1441 07/12/22 1511 07/12/22 1700 07/12/22 1710  BP: (!) 156/95 (!) 149/91 118/74 118/74   07/12/22 1715 07/12/22 1802 07/12/22 1829 07/12/22 1846  BP: (!) 140/84 (!) 148/79 (!) 154/96 (!) 170/90   07/12/22 1901 07/12/22 1923 07/12/22 1938 07/12/22 1955  BP: (!) 152/112 (!) 142/80 130/63 127/74    I/O last 3 completed shifts: In: 6262.2 [P.O.:1450; I.V.:3475.8; Blood:1070; IV Piggyback:266.4] Out: 8025 [Urine:5775; Blood:2250]   in the last 2 hours  Results for orders placed or performed during the hospital encounter of 07/11/22 (from the past 24 hour(s))  Magnesium     Status: Abnormal   Collection Time: 07/12/22  7:17 PM  Result Value Ref Range   Magnesium 2.6 (H) 1.7 - 2.4 mg/dL  CBC with Differential/Platelet     Status: Abnormal   Collection Time: 07/12/22  7:17 PM  Result Value Ref Range   WBC 22.4 (H) 4.0 - 10.5 K/uL   RBC 4.41 3.87 - 5.11 MIL/uL   Hemoglobin 12.8 12.0 - 15.0 g/dL   HCT 66.0 63.0 - 16.0 %   MCV 86.2 80.0 - 100.0 fL   MCH 29.0 26.0 - 34.0 pg   MCHC 33.7 30.0 - 36.0 g/dL   RDW 10.9 32.3 - 55.7 %   Platelets 219 150 - 400 K/uL   nRBC 0.0 0.0 - 0.2 %   Neutrophils Relative % 84 %   Neutro Abs 18.7 (H) 1.7 - 7.7 K/uL   Lymphocytes Relative 11 %   Lymphs Abs 2.4 0.7 - 4.0 K/uL   Monocytes Relative 4 %   Monocytes Absolute 1.0 0.1 - 1.0 K/uL   Eosinophils Relative 0 %   Eosinophils Absolute 0.0 0.0 - 0.5 K/uL   Basophils Relative 0 %   Basophils Absolute 0.1 0.0 - 0.1 K/uL   Immature Granulocytes 1 %   Abs Immature Granulocytes 0.17 (H) 0.00 - 0.07 K/uL    A: 36yo G14P9 s/p NSVD at [redacted]w[redacted]d followed by a D&C for retained placenta.  P: Restart magnesium sulfate 4 g loading dose followed by 2g/hr  Cyril Booth 07/12/2022 8:42 PM

## 2022-07-12 NOTE — Progress Notes (Signed)
LATE ENTRY DUE TO EMERGENT NATURE OF SITUATION:  Called to see patient due to placenta not releasing from uterus.  I was called after 32 minutes had passed since delivery.  I arrived about 25 minutes later.  Patient tired and mildly uncomfortable, though she did tolerate my exam. Placenta still firmly attached at some point.  Posteriorly some amount of placenta protrusion.  Patient participated by performing valsalva. Some further expulsion of placenta, but still attached.  After multiple attempted at extraction manually and lack of patient tolerance and no forward progress, I discussed with the patient my recommendation to proceed with dilation and curettage under ultrasound guidance.  We further discussed the possible need for blood transfusion, and though low likelihood, possible hysterectomy if accreta spectrum suspected.  Patient agreed to proceed.  All consents signed.  OR called and case posted as STAT.  See operative report for details.    Thomasene Mohair, MD, [facility] 07/12/2022 4:52 PM

## 2022-07-12 NOTE — Progress Notes (Signed)
Pt taken to main OR by this RN and surgical tech at this time. Gave report to [place].

## 2022-07-12 NOTE — Progress Notes (Signed)
Labor Progress Note  Brianna Booth is a 36 y.o. [id number] at [redacted]w[redacted]d by LMP admitted for induction of labor due to Pre-E with severe features.  Subjective: Pt is reporting feeling contractions in her lower abdomin but tolerating well.  Objective: BP 134/69   Pulse 78   Temp 98.3 F (36.8 C) (Oral)   Resp 18   Ht 5\' 7"  (1.702 m)   Wt 119.7 kg   LMP 11/26/2021   SpO2 95%   BMI 41.35 kg/m    Fetal Assessment: FHT:  FHR: 140 bpm, variability: moderate,  accelerations:  Present,  decelerations:  Absent Category/reactivity:  Category I UC:   regular, every 2-5 minutes SVE:    Dilation: 6cm  Effacement: 75%  Station:  -3  Consistency: medium  Position: middle  Membrane status: AROM at 2121 Amniotic color: Clear  Labs: Lab Results  Component Value Date   WBC 16.6 (H) 07/10/2022   HGB 12.1 07/10/2022   HCT 36.8 07/10/2022   MCV 89.1 07/10/2022   PLT 225 07/10/2022    Assessment / Plan: Induction of labor due to preeclampsia,  progressing on pitocin 1907 BID Procardia given 2208 7.5/80/ 0036 6/50/0 0101 Benadryl given 0319 8/70/-1 0446 Pitocin at 74mU 0641 8/70/-1 0645 Pitocin stopped 0750 BID Procardia given 0752 Tums given  0753 Pitocin restarted at 71mU 0912 6cm  Labor:  Plan is to continue with titration of Pitocin until MVUs are at least 200, after 1 hour of MVUs at 200 will recheck cervix Preeclampsia:   Highest BP 156/99 at 0758, Currently on Mag Sulfate, output adequate, BID Procardia continued Fetal Wellbeing:  Category I Pain Control:  Epidural I/D:   Afebrile, AROM x 12hr, GBS neg  Anticipated MOD:   Unclear at this point, will consider c/s if signs of infection develop, baby isn't tolerating labor, or no cervical progress with adequate contractions.  3m, CNM 07/12/2022, 10:32 AM

## 2022-07-12 NOTE — Progress Notes (Addendum)
Labor Progress Note  Brianna Booth is a 36 y.o. [id number] at [redacted]w[redacted]d by LMP admitted for induction of labor due to pre-eclampsia with severe features.  Subjective: she is overall comfortable  Objective: BP (!) 145/68 (BP Location: Left Arm)   Pulse 76   Temp 98.2 F (36.8 C) (Oral)   Resp 16   Ht 5\' 7"  (1.702 m)   Wt 119.7 kg   LMP 11/26/2021   SpO2 96%   BMI 41.35 kg/m  Vitals:   07/11/22 1532 07/11/22 1632 07/11/22 1729 07/11/22 1832  BP: 138/75 (!) 140/66 (!) 148/70 (!) 167/115   07/11/22 1835 07/11/22 1930 07/11/22 1934 07/11/22 2041  BP: (!) 150/80 (!) 155/80 (!) 155/80 (!) 170/106   07/11/22 2053 07/11/22 2150 07/11/22 2250 07/11/22 2350  BP: (!) 148/91 131/76 (!) 140/66 (!) 145/68    Notable VS details: reviewed  Fetal Assessment: FHT:  FHR: 135 bpm, variability: moderate,  accelerations:  Abscent,  decelerations:  Absent Category/reactivity:  Category I UC:   regular, every 2 minutes MVUs: 280-340 SVE:    Dilation: 6cm  Effacement: 70%  Station:  0  Consistency: soft, swollen  Position: middle  Membrane status:AROM @ 2121 Amniotic color: clear  Labs: Lab Results  Component Value Date   WBC 16.6 (H) 07/10/2022   HGB 12.1 07/10/2022   HCT 36.8 07/10/2022   MCV 89.1 07/10/2022   PLT 225 07/10/2022    Assessment / Plan: 36 year old 36 at [redacted]w[redacted]d transferred from [facility] in [place] for IOL for pre-eclampsia with severe features  Labor:  Has received 3 doses of cytotec, Cooke catheter came out, pitocin currently at  29mu/min, AROM with clear fluid , cervix edematous, will give IV Benadryl and place in trendelenburg positions to allow fetal head rotation and pressure off of the cervix. IUPC placed for assessment of adequate contraction strength.  Preeclampsia:    on magnesium sulfate, intake and ouput balanced, and labs stable - sent over from [facility] for pre-eclampsia with severe features. Had intractable headache and severe-range  blood pressures. Normal CT of the head. Received Tylenol, Oxycodone, Phenergan, and Compazine at [facility]. MFM recommended delivery for cHTN with superimposed pre-eclampsia with severe features and intractable headache.  - Patient has been on magnesium 2g/hr for almost 72hrs. Last magnesium level 07/11/22 @ 2309: 4.3 Fetal Wellbeing:  Category I Pain Control:  Epidural I/D:   GBS negative, AROM x  3.5hrs Anticipated MOD:  NSVD  07/13/22, CNM 07/12/2022, 12:43 AM

## 2022-07-12 NOTE — Hospital Course (Signed)
Risk assessment for postpartum VTE and prophylactic treatment: Very high risk factors: None High risk factors: PPH > with need for surgery , BMI 40-50 kg/m2, and Blood transfusion (<5 units) Moderate risk factors: Preeclampsia   Postpartum VTE prophylaxis with LMWH ordered    ** Needs LMWH 3-6 weeks ** check mag level at 730 pm or so with CBC/CMP ** mag x 24 hours ** continue  psych meds

## 2022-07-12 NOTE — Anesthesia Postprocedure Evaluation (Signed)
Anesthesia Post Note  Patient: Brianna Booth  Procedure(s) Performed: DILATATION AND CURETTAGE  Patient location during evaluation: PACU Anesthesia Type: General Level of consciousness: awake and alert, oriented and patient cooperative Pain management: pain level controlled Vital Signs Assessment: post-procedure vital signs reviewed and stable Respiratory status: spontaneous breathing, nonlabored ventilation and respiratory function stable Cardiovascular status: blood pressure returned to baseline and stable Postop Assessment: adequate PO intake, no headache and epidural receding Anesthetic complications: no   No notable events documented.   Last Vitals:  Vitals:   07/12/22 1715 07/12/22 1721  BP: (!) 140/84   Pulse: 85   Resp: 16   Temp:  36.4 C  SpO2: 100%     Last Pain:  Vitals:   07/12/22 1721  TempSrc:   PainSc: 7                  Reed Breech

## 2022-07-12 NOTE — Anesthesia Procedure Notes (Signed)
Procedure Name: Intubation Date/Time: 07/12/2022 3:56 PM  Performed by: Reed Breech, MDPre-anesthesia Checklist: Patient identified Patient Re-evaluated:Patient Re-evaluated prior to induction Oxygen Delivery Method: Circle system utilized Preoxygenation: Pre-oxygenation with 100% oxygen Induction Type: IV induction and Rapid sequence Laryngoscope Size: McGraph and 3 Grade View: Grade I Tube type: Oral Endobronchial tube: Right Tube size: 7.0 mm Number of attempts: 1 Airway Equipment and Method: Stylet Placement Confirmation: ETT inserted through vocal cords under direct vision, positive ETCO2 and breath sounds checked- equal and bilateral Tube secured with: Tape Dental Injury: Teeth and Oropharynx as per pre-operative assessment

## 2022-07-12 NOTE — Progress Notes (Signed)
Labor Progress Note  Brianna Booth is a 36 y.o. [id number] at [redacted]w[redacted]d by LMP admitted for induction of labor due to pre-eclampsia with severe features.  Subjective: she reports she can feel her contractions again  Objective: BP 135/73 (BP Location: Right Arm)   Pulse 80   Temp 97.7 F (36.5 C) (Oral)   Resp 16   Ht 5\' 7"  (1.702 m)   Wt 119.7 kg   LMP 11/26/2021   SpO2 95%   BMI 41.35 kg/m  Vitals:   07/11/22 1934 07/11/22 2041 07/11/22 2053 07/11/22 2150  BP: (!) 155/80 (!) 170/106 (!) 148/91 131/76   07/11/22 2250 07/11/22 2350 07/12/22 0048 07/12/22 0144  BP: (!) 140/66 (!) 145/68 133/68 (!) 146/80   07/12/22 0244 07/12/22 0352 07/12/22 0450 07/12/22 0545  BP: (!) 136/56 (!) 148/77 (!) 140/83 135/73   Notable VS details: reviewed  Fetal Assessment: FHT:  FHR: 140 bpm, variability: moderate,  accelerations:  Present,  decelerations:  Present recurrent early decelerations Category/reactivity:  Category I UC:   regular, every 1.5-3 minutes SVE:    Dilation: 8cm  Effacement: 70%  Station:  -1  Consistency: soft  Position: middle  Membrane status:AROM @ 2121 Amniotic color: clear  Labs: Lab Results  Component Value Date   WBC 16.6 (H) 07/10/2022   HGB 12.1 07/10/2022   HCT 36.8 07/10/2022   MCV 89.1 07/10/2022   PLT 225 07/10/2022    Assessment / Plan: 36 year old 31 at [redacted]w[redacted]d transferred from [facility] in [place] for IOL for pre-eclampsia with severe features  Labor:  pitocin currently on 76mu/min. She has made no cervical change and caput noted. Pitocin stopped for pitocin break. Will restart in 1 hour and give Tums with restarting pitocin. Discussed with her that she has not made cervical change despite position changes and adequate MVUs. Discussed with Dr. 12m and he will evaluate patient.  Preeclampsia:   on magnesium sulfate, intake and ouput balanced, and labs stable - sent over from [facility] for pre-eclampsia with severe features.  Had intractable headache and severe-range blood pressures. Normal CT of the head. Received Tylenol, Oxycodone, Phenergan, and Compazine at [facility]. MFM recommended delivery for cHTN with superimposed pre-eclampsia with severe features and intractable headache.  - Patient has been on magnesium 2g/hr for almost 72hrs. Last magnesium level 07/11/22 @ 0619 - 4.0 Fetal Wellbeing:  Category I Pain Control:  Epidural I/D:   GBS negative,  AROM x10hrs Anticipated MOD:  NSVD  07/13/22, CNM 07/12/2022, 7:50 AM

## 2022-07-13 ENCOUNTER — Encounter: Payer: Self-pay | Admitting: Obstetrics and Gynecology

## 2022-07-13 LAB — CREATININE, SERUM
Creatinine, Ser: 0.5 mg/dL (ref 0.44–1.00)
GFR, Estimated: >60 mL/min (ref >60)

## 2022-07-13 LAB — TYPE AND SCREEN
ABO/RH(D): O POS
Antibody Screen: NEGATIVE
Unit division: 0
Unit division: 0
Unit division: 0
Unit division: 0

## 2022-07-13 LAB — BPAM RBC
Blood Product Expiration Date: 202401192359
Blood Product Expiration Date: 202401192359
Blood Product Expiration Date: 202401242359
Blood Product Expiration Date: 202401242359
ISSUE DATE / TIME: 202312241614
ISSUE DATE / TIME: 202312241614
Unit Type and Rh: 5100
Unit Type and Rh: 5100
Unit Type and Rh: 5100
Unit Type and Rh: 5100

## 2022-07-13 LAB — CBC
HCT: 34.5 % — ABNORMAL LOW (ref 36.0–46.0)
Hemoglobin: 11.7 g/dL — ABNORMAL LOW (ref 12.0–15.0)
MCH: 29.5 pg (ref 26.0–34.0)
MCHC: 33.9 g/dL (ref 30.0–36.0)
MCV: 86.9 fL (ref 80.0–100.0)
Platelets: 220 10*3/uL (ref 150–400)
RBC: 3.97 MIL/uL (ref 3.87–5.11)
RDW: 14.7 % (ref 11.5–15.5)
WBC: 24.7 10*3/uL — ABNORMAL HIGH (ref 4.0–10.5)
nRBC: 0 % (ref 0.0–0.2)

## 2022-07-13 LAB — PREPARE RBC (CROSSMATCH)

## 2022-07-13 MED ORDER — DOCUSATE SODIUM 100 MG PO CAPS
100.0000 mg | ORAL_CAPSULE | Freq: Two times a day (BID) | ORAL | Status: DC
  Administered 2022-07-13 – 2022-07-15 (×3): 100 mg via ORAL
  Filled 2022-07-13 (×3): qty 1

## 2022-07-13 MED ORDER — FLEET ENEMA 7-19 GM/118ML RE ENEM: 1.0000 | ENEMA | Freq: Once | RECTAL | Status: DC | PRN

## 2022-07-13 MED ORDER — CLONAZEPAM 0.5 MG PO TABS
1.0000 mg | ORAL_TABLET | Freq: Two times a day (BID) | ORAL | Status: DC | PRN
  Administered 2022-07-13 – 2022-07-15 (×3): 1 mg via ORAL
  Filled 2022-07-13 (×3): qty 2

## 2022-07-13 MED ORDER — NIFEDIPINE ER OSMOTIC RELEASE 30 MG PO TB24
30.0000 mg | ORAL_TABLET | Freq: Once | ORAL | Status: AC
  Administered 2022-07-13: 30 mg via ORAL
  Filled 2022-07-13: qty 1

## 2022-07-13 MED ORDER — NIFEDIPINE ER OSMOTIC RELEASE 30 MG PO TB24
30.0000 mg | ORAL_TABLET | Freq: Every day | ORAL | Status: DC
  Administered 2022-07-13: 30 mg via ORAL
  Filled 2022-07-13: qty 1

## 2022-07-13 MED ORDER — NIFEDIPINE ER OSMOTIC RELEASE 30 MG PO TB24
60.0000 mg | ORAL_TABLET | Freq: Every day | ORAL | Status: DC
  Administered 2022-07-14 – 2022-07-15 (×2): 60 mg via ORAL
  Filled 2022-07-13 (×2): qty 2

## 2022-07-13 MED ORDER — ENOXAPARIN SODIUM 60 MG/0.6ML IJ SOSY
0.5000 mg/kg | PREFILLED_SYRINGE | INTRAMUSCULAR | Status: DC
  Administered 2022-07-13 – 2022-07-14 (×2): 60 mg via SUBCUTANEOUS
  Filled 2022-07-13 (×2): qty 0.6

## 2022-07-13 NOTE — Progress Notes (Signed)
S: notified by RN that patient states she is feeling extremely anxious and claustrophobic and wants Klonopin 1mg  PO. She states she has been taking Klonopin 1mg  TID until she was transferred here and it helps her anxiety.  O: Patient has previously been prescribed Klonopin 1mg  TID PRN anxiety. Patient is crying and anxious.  A: 36 year old at PPD 1 with anxiety  P: Advised not to breastfeed with Klonopin. Patient will either pump and dump, or not breastfeed. Klonopin 1mg  BID PRN prescribed. Patient transferred to a larger postpartum room to help with her claustrophobia.  , CNM 07/13/2022 10:25 PM

## 2022-07-13 NOTE — Progress Notes (Signed)
Post Partum Day 1 Subjective: Doing well, no complaints.  Tolerating regular diet, pain with PO meds, voiding and ambulating without difficulty.  Reports continued visual changes but headache has resolved.  No CP SOB Fever,Chills, N/V or leg pain; denies nipple or breast pain, no HA or RUQ/epigastric pain  Objective: BP (!) 155/66   Pulse 83   Temp 98.1 F (36.7 C) (Oral)   Resp 16   Ht 5\' 7"  (1.702 m)   Wt 119.7 kg   LMP 11/26/2021   SpO2 99%   Breastfeeding Unknown   BMI 41.35 kg/m    Vitals:   07/12/22 2109 07/12/22 2119 07/12/22 2144 07/12/22 2250  BP: (!) 149/83 (!) 161/83 (!) 155/84 (!) 155/83   07/13/22 0012 07/13/22 0100 07/13/22 0331 07/13/22 0421  BP: 127/71 131/76 (!) 152/93 (!) 154/88   07/13/22 0536 07/13/22 0700 07/13/22 0804 07/13/22 0858  BP: 128/63 127/62 (!) 155/66 (!) 160/102    Physical Exam:  General: NAD Breasts: soft/nontender CV: RRR Pulm: nl effort, CTABL Abdomen: soft, NT, BS x 4 Perineum: minimal edema, intact Lochia: moderate Uterine Fundus: fundus firm and 1 fb below umbilicus DVT Evaluation: no cords, ttp LEs   Recent Labs    07/12/22 1917 07/13/22 0636  HGB 12.8 11.7*  HCT 38.0 34.5*  WBC 22.4* 24.7*  PLT 219 220   I/O last 3 completed shifts: In: 5923.4 [P.O.:1020; I.V.:3467; Blood:1070; IV Piggyback:366.4] Out: 7790 [Urine:5540; Blood:2250] Total I/O In: 250 [P.O.:150; I.V.:100] Out: 0   Assessment/Plan: 36 y.o. 31 postpartum day # 1  - Continue routine PP care - Lactation consult PRN - Discussed contraceptive options including implant, IUDs hormonal and non-hormonal, injection, pills/ring/patch, condoms, and NFP.  - Acute blood loss anemia - hemodynamically stable and asymptomatic; start po ferrous sulfate BID with stool softeners. Received 2u PRBCs yesterday - cHTN with superimposed pre-eclampsia: currently on magneisum until 24hrs after delivery. Procardia 30mg  XL ordered. Labetalol protocol PRN severe-range  blood pressures - Immunization status: all Imms up to date  Disposition: Does not desire Dc home today.   [id number], CNM 07/13/2022 8:58 AM

## 2022-07-13 NOTE — Consult Note (Signed)
ANTICOAGULATION CONSULT NOTE   Pharmacy Consult for Lovenox Indication: VTE prophylaxis  No Known Allergies  Patient Measurements: Height: 5\' 7"  (170.2 cm) Weight: 119.7 kg (264 lb) IBW/kg (Calculated) : 61.6 Heparin Dosing Weight: 89.8 kg  Vital Signs: Temp: 98 F (36.7 C) (12/25 1054) Temp Source: Oral (12/25 1054) BP: 151/91 (12/25 1052) Pulse Rate: 85 (12/25 1052)  Labs: Recent Labs    07/12/22 1917 07/13/22 0636  HGB 12.8 11.7*  HCT 38.0 34.5*  PLT 219 220    Estimated Creatinine Clearance: 131.6 mL/min (by C-G formula based on SCr of 0.55 mg/dL).   Medical History: Past Medical History:  Diagnosis Date   Anxiety    Asthma    Bipolar 1 disorder (HCC)    Depression    Essential hypertension 02/22/2019   Headache    Preterm labor    Schizo-affective schizophrenia (HCC)     Medications:  Scheduled:   ARIPiprazole  20 mg Oral QHS   ferrous sulfate  325 mg Oral BID WC   ibuprofen  600 mg Oral Q6H   lamoTRIgine  150 mg Oral QHS   NIFEdipine  30 mg Oral Daily   pantoprazole  40 mg Oral BID   prenatal multivitamin  1 tablet Oral Q1200   senna-docusate  2 tablet Oral Q24H   Infusions:   lactated ringers 10 mL/hr at 07/13/22 0500   magnesium sulfate     PRN: acetaminophen, acetaminophen, albuterol, benzocaine-Menthol, coconut oil, witch hazel-glycerin **AND** dibucaine, diphenhydrAMINE, diphenhydrAMINE, guaiFENesin, labetalol **AND** labetalol **AND** labetalol **AND** hydrALAZINE **AND** Measure blood pressure, ondansetron **OR** ondansetron (ZOFRAN) IV, oxyCODONE, simethicone  Assessment: Brianna Booth is a 36 y.o. female 31 postpartum day # 1. PMH significant for schizophrenia, bipolar disorder, HTN. Pharmacy has been consulted to dose lovenox for DVT prophylaxis.   Plan:  Start lovenox 60 mg SQ daily Pharmacy will sign off and continue to monitor peripherally  [id number], PharmD PGY1 Pharmacy Resident 07/13/2022 11:42 AM

## 2022-07-14 ENCOUNTER — Encounter: Payer: Self-pay | Admitting: Obstetrics and Gynecology

## 2022-07-14 MED ORDER — CYCLOBENZAPRINE HCL 10 MG PO TABS
10.0000 mg | ORAL_TABLET | Freq: Three times a day (TID) | ORAL | Status: AC | PRN
  Administered 2022-07-14 (×2): 10 mg via ORAL
  Filled 2022-07-14 (×5): qty 1

## 2022-07-14 NOTE — Progress Notes (Signed)
Post Partum Day 2 Subjective: Doing well.  Tolerating regular diet, pain with PO meds, voiding and ambulating without difficulty.  She reports constipation  No CP SOB Fever,Chills, N/V or leg pain; denies nipple or breast pain, no HA change of vision, RUQ/epigastric pain  Objective: BP 124/65 (BP Location: Left Arm)   Pulse 81   Temp 98.1 F (36.7 C) (Oral)   Resp 18   Ht 5\' 7"  (1.702 m)   Wt 114.9 kg   LMP 11/26/2021   SpO2 100%   Breastfeeding Unknown   BMI 39.67 kg/m    Vitals:   07/13/22 1317 07/13/22 1338 07/13/22 1408 07/13/22 1445  BP: (!) 162/77 (!) 145/73 (!) 157/94 (!) 162/90   07/13/22 1505 07/13/22 1607 07/13/22 1657 07/13/22 2021  BP: (!) 156/95 (!) 149/90 (!) 152/93 (!) 151/133   07/13/22 2036 07/13/22 2124 07/13/22 2347 07/14/22 0330  BP: (!) 160/91 (!) 143/65 (!) 156/91 124/65    Physical Exam:  General: NAD Breasts: soft/nontender CV: RRR Pulm: nl effort, CTABL Abdomen: soft, NT, BS x 4 Perineum: minimal edema, intact Lochia: moderate Uterine Fundus: fundus firm and 1 fb below umbilicus DVT Evaluation: no cords, ttp LEs   Recent Labs    07/12/22 1917 07/13/22 0636  HGB 12.8 11.7*  HCT 38.0 34.5*  WBC 22.4* 24.7*  PLT 219 220    Assessment/Plan: 36 y.o. 31 postpartum day # 2  - Continue routine PP care - Lactation consult PRN, taking Klonopin so not breastfeeding at this time, but wants to pump - Discussed contraceptive options including implant, IUDs hormonal and non-hormonal, injection, pills/ring/patch, condoms, and NFP.  - Acute blood loss anemia - hemodynamically stable and asymptomatic; start po ferrous sulfate BID with stool softeners  - Immunization status: all Imms up to date - constipation: Colace and Sennokot ordered. Advised high-fiber breakfast. Increase hydration. Enema ordered PRN - Chronic HTN with superimposed pre-eclampsia with severe features: was on magnesium x 4 days, last rescue labetalol yesterday around 1445.  Taking Procardia 60mg  XL. BP has improved since taking Klonopin. - Anxiety: she typically takes Klonopin 1mg  PRN anxiety. Prescribed Klonopin to help with her anxiety. She reports today she is less anxious.   Disposition: Does not desire Dc home today.   [id number], CNM 07/14/2022 7:54 AM

## 2022-07-14 NOTE — Addendum Note (Signed)
Addendum  created 07/14/22 2028 by Louie Boston, MD   Clinical Note Signed, Intraprocedure Blocks edited, SmartForm saved

## 2022-07-14 NOTE — Lactation Note (Signed)
This note was copied from a baby's chart. Lactation Consultation Note  Patient Name: Brianna Booth [id number] Date: 07/14/2022   Age:35 hours  Attempted lactation check-in to determine mom's feeding preference. Mom is sleeping, support person asks for return visit.    Danford Bad 07/14/2022, 10:23 AM

## 2022-07-15 LAB — SURGICAL PATHOLOGY

## 2022-07-15 MED ORDER — CYCLOBENZAPRINE HCL 10 MG PO TABS: 10.0000 mg | ORAL_TABLET | Freq: Three times a day (TID) | ORAL | 0 refills | Status: AC | PRN

## 2022-07-15 MED ORDER — IBUPROFEN 600 MG PO TABS: 600.0000 mg | ORAL_TABLET | Freq: Four times a day (QID) | ORAL | 0 refills | Status: AC

## 2022-07-15 MED ORDER — ENOXAPARIN SODIUM 60 MG/0.6ML IJ SOSY: 0.5000 mg/kg | PREFILLED_SYRINGE | INTRAMUSCULAR | 0 refills | Status: AC

## 2022-07-15 MED ORDER — OXYCODONE HCL 5 MG PO TABS: 5.0000 mg | ORAL_TABLET | ORAL | 0 refills | Status: AC | PRN

## 2022-07-15 MED ORDER — ACETAMINOPHEN 500 MG PO TABS: 1000.0000 mg | ORAL_TABLET | ORAL | 0 refills | Status: AC | PRN

## 2022-07-15 MED ORDER — FERROUS SULFATE 325 (65 FE) MG PO TABS: 325.0000 mg | ORAL_TABLET | Freq: Two times a day (BID) | ORAL | 3 refills | Status: AC

## 2022-07-15 MED ORDER — PANTOPRAZOLE SODIUM 40 MG PO TBEC: 40.0000 mg | DELAYED_RELEASE_TABLET | Freq: Two times a day (BID) | ORAL | 0 refills | Status: AC

## 2022-07-15 MED ORDER — NIFEDIPINE ER 60 MG PO TB24: 60.0000 mg | ORAL_TABLET | Freq: Every day | ORAL | 0 refills | Status: AC

## 2022-07-15 NOTE — Discharge Instructions (Addendum)
Vaginal Delivery, Care After Refer to this sheet in the next few weeks. These discharge instructions provide you with information on caring for yourself after delivery. Your caregiver may also give you specific instructions. Your treatment has been planned according to the most current medical practices available, but problems sometimes occur. Call your caregiver if you have any problems or questions after you go home. HOME CARE INSTRUCTIONS Take over-the-counter or prescription medicines only as directed by your caregiver or pharmacist. Do not drink alcohol, especially if you are breastfeeding or taking medicine to relieve pain. Do not smoke tobacco. Continue to use good perineal care. Good perineal care includes: Wiping your perineum from back to front Keeping your perineum clean. You can do sitz baths twice a day, to help keep this area clean Do not use tampons, douche or have sex until your caregiver says it is okay. Shower only and avoid sitting in submerged water, aside from sitz baths Wear a well-fitting bra that provides breast support. Eat healthy foods. Drink enough fluids to keep your urine clear or pale yellow. Eat high-fiber foods such as whole grain cereals and breads, brown rice, beans, and fresh fruits and vegetables every day. These foods may help prevent or relieve constipation. Avoid constipation with high fiber foods or medications, such as miralax or metamucil Follow your caregiver's recommendations regarding resumption of activities such as climbing stairs, driving, lifting, exercising, or traveling. Talk to your caregiver about resuming sexual activities. Resumption of sexual activities is dependent upon your risk of infection, your rate of healing, and your comfort and desire to resume sexual activity. Try to have someone help you with your household activities and your newborn for at least a few days after you leave the hospital. Rest as much as possible. Try to rest or  take a nap when your newborn is sleeping. Increase your activities gradually. Keep all of your scheduled postpartum appointments. It is very important to keep your scheduled follow-up appointments. At these appointments, your caregiver will be checking to make sure that you are healing physically and emotionally. SEEK MEDICAL CARE IF:  You are passing large clots from your vagina. Save any clots to show your caregiver. You have a foul smelling discharge from your vagina. You have trouble urinating. You are urinating frequently. You have pain when you urinate. You have a change in your bowel movements. You have increasing redness, pain, or swelling near your vaginal incision (episiotomy) or vaginal tear. You have pus draining from your episiotomy or vaginal tear. Your episiotomy or vaginal tear is separating. You have painful, hard, or reddened breasts. You have a severe headache. You have blurred vision or see spots. You feel sad or depressed. You have thoughts of hurting yourself or your newborn. You have questions about your care, the care of your newborn, or medicines. You are dizzy or light-headed. You have a rash. You have nausea or vomiting. You were breastfeeding and have not had a menstrual period within 12 weeks after you stopped breastfeeding. You are not breastfeeding and have not had a menstrual period by the 12th week after delivery. You have a fever. SEEK IMMEDIATE MEDICAL CARE IF:  You have persistent pain. You have chest pain. You have shortness of breath. You faint. You have leg pain. You have stomach pain. Your vaginal bleeding saturates two or more sanitary pads in 1 hour. MAKE SURE YOU:  Understand these instructions. Will watch your condition. Will get help right away if you are not doing well or   get worse. Document Released: 07/03/2000 Document Revised: 11/20/2013 Document Reviewed: 03/02/2012 [facility] Patient Information 2015 [place], [place]. This  information is not intended to replace advice given to you by your health care provider. Make sure you discuss any questions you have with your health care provider.  Sitz Bath A sitz bath is a warm water bath taken in the sitting position. The water covers only the hips and butt (buttocks). We recommend using one that fits in the toilet, to help with ease of use and cleanliness. It may be used for either healing or cleaning purposes. Sitz baths are also used to relieve pain, itching, or muscle tightening (spasms). The water may contain medicine. Moist heat will help you heal and relax.  HOME CARE  Take 3 to 4 sitz baths a day. Fill the bathtub half-full with warm water. Sit in the water and open the drain a little. Turn on the warm water to keep the tub half-full. Keep the water running constantly. Soak in the water for 15 to 20 minutes. After the sitz bath, pat the affected area dry. GET HELP RIGHT AWAY IF: You get worse instead of better. Stop the sitz baths if you get worse. MAKE SURE YOU: Understand these instructions. Will watch your condition. Will get help right away if you are not doing well or get worse. Document Released: 08/13/2004 Document Revised: 03/30/2012 Document Reviewed: 11/03/2010 [facility] Patient Information 2015 [place], [place]. This information is not intended to replace advice given to you by your health care provider. Make sure you discuss any questions you have with your health care provider.    Discharge instructions:   Call office if you have any of the following:  headache, visual changes, fever >101.0 F, chills, breast concerns (engorgement, mastitis) excessive vaginal bleeding, incision drainage or problems, leg pain or redness, depression or any other concerns.   Activity: Do not lift > 10 lbs for 6 weeks.  No intercourse or tampons for 6 weeks.  No driving for 1-2 weeks or while taking pain medication. No strenuous activity or heavy lifting for 6  weeks.  No swimming pools, hot tubs or tub baths- showers only.    It is normal to bleed for up to 6 weeks. You should not soak through more than 1 pad in 1 hour.   Continue prenatal vitamin. Increase calories and fluids while breastfeeding.  Your milk will come in, in the next couple of days (right now it is colostrum).  You may have a slight fever when your milk comes in, but it should go away on its own.   If it does not, and rises above 101 F please call the doctor.  You will also feel achy and your breasts will be firm. They will also start to leak.  If you are breastfeeding, continue as you have been and you can pump/express milk for comfort.   For concerns about your baby, please call your pediatrician For breastfeeding concerns, the lactation consultant can be reached at [phone]  Postpartum blues (feelings of happy one minute and sad another minute) are normal for the first few weeks but if it gets worse let your doctor know.

## 2022-07-15 NOTE — TOC Initial Note (Signed)
Transition of Care [facility](TOC) - Initial/Assessment Note    Patient Details  Name: Brianna Booth MRN: [id number] Date of Birth: 02-28-1986  Transition of Care [facility](TOC) CM/SW Contact:    Darolyn RuaAshley M Holcomb, LCSW Phone Number: 07/15/2022, 11:31 AM  Clinical Narrative:                  CSW spoke with patient at bedside, consulted for depression scores. Patient reports she has a hx of depression, is on medication and follows up with a therapist. She reports she plans on following up with a therapist at discharge. Patient provided with postpartum depression packet and coping skills.   Patient reports no further needs at this time, reports will be following up on getting things ready at home for the baby, as child will be in NICU for a little.   Expected Discharge Plan: Home/Self Care Barriers to Discharge: No Barriers Identified   Patient Goals and CMS Choice Patient states their goals for this hospitalization and ongoing recovery are:: to go home CMS Medicare.gov Compare Post Acute Care list provided to:: Patient Choice offered to / list presented to : Patient      Expected Discharge Plan and Services         Expected Discharge Date: 07/15/22                                    Prior Living Arrangements/Services   Lives with:: Spouse                   Activities of Daily Living Home Assistive Devices/Equipment: None ADL Screening (condition at time of admission) Patient's cognitive ability adequate to safely complete daily activities?: Yes Is the patient deaf or have difficulty hearing?: No Does the patient have difficulty seeing, even when wearing glasses/contacts?: No Does the patient have difficulty concentrating, remembering, or making decisions?: No Patient able to express need for assistance with ADLs?: Yes Does the patient have difficulty dressing or bathing?: No Independently performs ADLs?: Yes (appropriate for developmental age) Does the patient have  difficulty walking or climbing stairs?: No Weakness of Legs: Both Weakness of Arms/Hands: None  Permission Sought/Granted                  Emotional Assessment              Admission diagnosis:  Pre-eclampsia [O14.90] Severe pre-eclampsia in third trimester [O14.13] Patient Active Problem List   Diagnosis Date Noted   Retained placenta 07/12/2022   Pre-eclampsia 07/11/2022   Severe pre-eclampsia in third trimester 07/11/2022   Chronic hypertension with superimposed preeclampsia 07/08/2022   Schizophrenia (HCC) 02/22/2019   Mild intermittent asthma without complication 02/22/2019   Essential hypertension 02/22/2019   Bipolar 1 disorder, depressed, moderate (HCC) 02/22/2019   History of preterm delivery, currently pregnant 06/12/2018   Anxiety and depression 06/12/2018   Hx of preeclampsia, prior pregnancy, currently pregnant, second trimester 02/23/2018   Bell's palsy 03/05/2017   PCP:  Patient, No Pcp Per Pharmacy:   [facility] [phone]#[id number] [place] - [address] [facility] [address] [place] [id number] Phone: [phone] Fax: [phone]  [id number] [place] - [address] AT [facility] [address] [place] [id number] Phone: [phone] Fax: [phone]     Social Determinants of  Health (SDOH) Social History: SDOH Screenings   Food Insecurity: No Food Insecurity (07/11/2022)  Housing: Low Risk  (07/11/2022)  Transportation Needs: No Transportation Needs (07/11/2022)  Utilities: Not At Risk (07/11/2022)  Depression (PHQ2-9): Medium Risk (05/14/2019)  Tobacco Use: High Risk (07/14/2022)   SDOH Interventions:     Readmission Risk Interventions     No data to display

## 2022-07-15 NOTE — Discharge Summary (Signed)
Obstetrical Discharge Summary  Patient Name: Brianna Booth DOB: 12/12/1985 MRN: [id number]  Date of Admission: 07/11/2022 Date of Delivery: 07/12/2022 Delivered by: Brianna Booth, CNM  Date of Discharge: 07/15/2022  Primary OB:  [facility] CVE:[id number]'P last menstrual period was 11/26/2021. 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 H/o Pre-E Bipolar and Schizoaffective disorder Severe anxiety Stable Asthma  Admitting Diagnosis: Pre-eclampsia [O14.90] Severe pre-eclampsia in third trimester [O14.13]  Secondary Diagnosis: Patient Active Problem List   Diagnosis Date Noted   Retained placenta 07/12/2022   Pre-eclampsia 07/11/2022   Severe pre-eclampsia in third trimester 07/11/2022   Chronic hypertension with superimposed preeclampsia 07/08/2022   Schizophrenia (HCC) 02/22/2019   Mild intermittent asthma without complication 02/22/2019   Essential hypertension 02/22/2019   Bipolar 1 disorder, depressed, moderate (HCC) 02/22/2019   History of preterm delivery, currently pregnant 06/12/2018   Anxiety and depression 06/12/2018   Hx of preeclampsia, prior pregnancy, currently pregnant, second trimester 02/23/2018   Bell's palsy 03/05/2017    Discharge Diagnosis: Preterm Pregnancy Delivered and CHTN with superimposed preeclampsia    Induction: AROM, Pitocin, Cytotec, and IP Foley Complications: Prolonged active stage, retained placenta  Intrapartum complications/course: [name] was transferred to L&D from [facility] in [place] for induction and management of chronic hypertension with superimposed preeclampsia.  Please see delivery note and OP note for further details of delivery and management of retained placenta.  Delivery Type: spontaneous vaginal delivery Anesthesia: epidural anesthesia Placenta: manual removal with D&C  To Pathology: Yes   Laceration: none Episiotomy: none Newborn Data: Live born female  Birth Weight: 4 lb 4.8 oz (1950 g) APGAR: 4, 6  Newborn Delivery   Birth date/time: 07/12/2022 13:41:00 Delivery type: Vaginal, Spontaneous      Postpartum Procedures: transfusion 2 units pRBC, curettage, antibiotics, and magnesium sulfate infusion  Edinburgh:     07/15/2022    8:00 AM 07/05/2018    9:17 PM  Edinburgh Postnatal Depression Scale Screening Tool  I have been able to laugh and see the funny side of things. 1 1  I have looked forward with enjoyment to things. 1 2  I have blamed myself unnecessarily when things went wrong. 2 2  I have been anxious or worried for no good reason. 1 2  I have felt scared or panicky for no good reason. 3 0  Things have been getting on top of me. 3 2  I have been so unhappy that I have had difficulty sleeping. 2 2  I have felt sad or miserable. 1 2  I have been so unhappy that I have been crying. 3 1  The thought of harming myself has occurred to me. 0 0  Edinburgh Postnatal Depression Scale Total 17 14     Post partum course:  Patient had a postpartum course complicated by retained placenta and hypertension.  By time of discharge on PPD#3, her pain was controlled on oral pain medications; she had appropriate lochia and was ambulating, voiding without difficulty and tolerating regular diet.  Blood pressures were controlled with oral antihypertensives. She was deemed stable for discharge to home.    Discharge Physical Exam:  BP (!) 136/94 (BP Location: Right Arm)   Pulse (!) 110   Temp 97.8 F (36.6 C) (Oral)   Resp 19   Ht 5\' 7"  (1.702 m)   Wt 112 kg   LMP 11/26/2021   SpO2 100%   Breastfeeding Unknown  BMI 38.69 kg/m   General: NAD CV: RRR Pulm: CTABL, nl effort ABD: s/nd/nt, fundus firm and below the umbilicus Lochia: moderate Perineum: minimal edema/intact DVT Evaluation: LE non-ttp, no evidence of DVT on exam.  Hemoglobin  Date Value Ref Range  Status  07/13/2022 11.7 (L) 12.0 - 15.0 g/dL Final  71/24/5809 98.3 11.1 - 15.9 g/dL Final   HCT  Date Value Ref Range Status  07/13/2022 34.5 (L) 36.0 - 46.0 % Final   Hematocrit  Date Value Ref Range Status  06/15/2018 34.9 34.0 - 46.6 % Final    Risk assessment for postpartum VTE and prophylactic treatment: Very high risk factors: None High risk factors: Blood transfusion (<5 units) Moderate risk factors: PPH > and Preeclampsia   Postpartum VTE prophylaxis with LMWH ordered  Disposition: stable, discharge to home. Baby Feeding: formula feeding Baby Disposition: home with mom  Rh Immune globulin indicated: No Rubella vaccine given: was not indicated Varivax vaccine given: was not indicated Flu vaccine given in AP setting: offered  Tdap vaccine given in AP setting: ofered   Contraception: Depo versus Nexplanon   Prenatal Labs:  Blood type/Rh O pos  Antibody screen neg  Rubella Immune  Varicella    RPR NR  HBsAg Neg  HIV NR  GC Neg  Chlamydia Neg  Genetic screening    1 hour GTT    3 hour GTT    GBS Neg    Plan:  Brianna Booth was discharged to home in good condition. Follow-up appointment with prenatal provider in 2 days for blood pressure check.   Discharge Medications: Allergies as of 07/15/2022   No Known Allergies      Medication List     STOP taking these medications    aspirin EC 81 MG tablet   Prenatal Vitamins 0.8 MG tablet   promethazine 25 MG tablet Commonly known as: PHENERGAN       TAKE these medications    acetaminophen 500 MG tablet Commonly known as: TYLENOL Take 2 tablets (1,000 mg total) by mouth every 4 (four) hours as needed (for pain scale < 4  OR  temperature  >/=  100.5 F).   albuterol 108 (90 Base) MCG/ACT inhaler Commonly known as: VENTOLIN HFA Inhale 1-2 puffs into the lungs every 6 (six) hours as needed for wheezing or shortness of breath.   cyclobenzaprine 10 MG tablet Commonly known as:  FLEXERIL Take 1 tablet (10 mg total) by mouth 3 (three) times daily as needed for muscle spasms.   enoxaparin 60 MG/0.6ML injection Commonly known as: LOVENOX Inject 0.6 mLs (60 mg total) into the skin daily for 21 days.   ferrous sulfate 325 (65 FE) MG tablet Take 1 tablet (325 mg total) by mouth 2 (two) times daily with a meal.   ibuprofen 600 MG tablet Commonly known as: ADVIL Take 1 tablet (600 mg total) by mouth every 6 (six) hours.   NIFEdipine 60 MG 24 hr tablet Commonly known as: ADALAT CC Take 1 tablet (60 mg total) by mouth daily.   oxyCODONE 5 MG immediate release tablet Commonly known as: Oxy IR/ROXICODONE Take 1 tablet (5 mg total) by mouth every 4 (four) hours as needed for up to 5 days for severe pain or breakthrough pain.   pantoprazole 40 MG tablet Commonly known as: PROTONIX Take 1 tablet (40 mg total) by mouth 2 (two) times daily.         Follow-up Information     [facility].  Schedule an appointment as soon as possible for a visit in 2 day(s).   Why: blood pressure check                Signed:  Margaretmary Eddy, CNM Certified Nurse Midwife [facility] OB[facility]

## 2022-07-15 NOTE — Progress Notes (Signed)
.  Patient discharged home with family.  Discharge instructions, when to follow up, and prescriptions reviewed with patient.  Patient retrieving placenta for encapsulation use.  Patient verbalized understanding. Patient denies use for wheelchair. Ambulating to car

## 2022-07-15 NOTE — Care Management Important Message (Signed)
Important Message  Patient Details  Name: Brianna Booth MRN: [id number] Date of Birth: 1985-09-05   Medicare Important Message Given:  Yes     Johnell Comings 07/15/2022, 10:21 AM

## 2022-07-20 NOTE — L&D Delivery Note (Addendum)
Delivery Note  Date of delivery: 07/12/22 Estimated Date of Delivery: 09/02/22 Patient's last menstrual period was 11/26/2021. EGA: [redacted]w[redacted]d  Delivery Note At 1:41 PM a viable female was delivered via Vaginal, Spontaneously into the bed.  APGAR: 4, 6; weight 4 lb 4.8 oz (1950 g).  Placenta status: Retained.  Cord: 3 vessels with the following complications: none.  Cord pH: n/a  First Stage: Labor onset: unknown Augmentation : AROM, cytotec, Foley bulb Analgesia /Anesthesia intrapartum: Epidural AROM at 2121  Brianna Booth transferred from [facility] to L&D with severe pre-e and needed induction of labor. She was induced with cytotec, foley bulb, AROM and pitocin. Epidural placed for pain relief.   Second Stage: Complete dilation at 1341 Onset of pushing at 1341 FHR second stage Cat I Delivery at 1341 on 07/22/2022  She progressed to complete and had a spontaneous vaginal birth of a live female over an intact perineum. Anterior then posterior shoulders delivered spontaneously. Baby placed on mom's abdomen and attended to by transition RN. Cord clamped and cut by FOB.   Third Stage: Placenta would not detach in L&D and pt was taken to the OR for removal Placenta disposition: pathology Uterine tone firm / bleeding mod IV pitocin given for hemorrhage prophylaxis  Anesthesia: Epidural Episiotomy: None Lacerations: None Suture Repair: n/a Est. Blood Loss (mL): 4540  Complications: Retained placenta and blood loss of 1750  Mom to OR.  Baby with FOB.  Newborn: Birth Weight: 1950g  Apgar Scores: 4, 6 Feeding planned: breastfeeding   Brianna Booth, CNM 07/22/2022 10:32 AM

## 2022-07-23 ENCOUNTER — Ambulatory Visit: Payer: Self-pay

## 2022-07-23 NOTE — Lactation Note (Signed)
This note was copied from a baby's chart. Lactation Consultation Note  Patient Name: Boy Gilberte Gorley CEYEM'V Date: 07/23/2022   Age:36 days  Lactation called in to speak with parents about milk supply. Baby turned 34 weeks and has been transitioned to formula. Mom has not been bringing in any expressed breast milk at this time.  Mom states today that she has not been by or contacted [facility] (lives in [place]) about obtaining a pump and forgot to take home the kit from when she was inpatient that included parts for a hand pump.  We talked about pumping options: hand pump, obtainment of EBP, hand expression.  LC stressed importance of frequent stimulation and milk removal to encourage a delayed lactogenesis II from no stimulation for several days. LC reviewed importance of minimum every 2-3 hours, preferably by a DEBP (hospital grade from [facility] or rental). Also recommended a set-up of DEBP here at baby's bedside for mom to utilize when visiting.  After reviewing all options and providing all education mom opts for hand pump at this time.  --Also provided mom with brochure for the milk bank in [place]. Mom had questions re: purchasing donor breast milk  LC faxed referral to [facility], and provided mom with lactation number for [facility] to call with questions/support as needed.  Lavonia Drafts 07/23/2022, 5:00 PM

## 2022-07-26 ENCOUNTER — Ambulatory Visit (HOSPITAL_COMMUNITY)
Admission: EM | Admit: 2022-07-26 | Discharge: 2022-07-26 | Disposition: A | Discharge status: Home / Self Care | Payer: Medicare HMO

## 2022-07-26 ENCOUNTER — Emergency Department (HOSPITAL_BASED_OUTPATIENT_CLINIC_OR_DEPARTMENT_OTHER): Payer: Medicare HMO

## 2022-07-26 ENCOUNTER — Encounter (HOSPITAL_BASED_OUTPATIENT_CLINIC_OR_DEPARTMENT_OTHER): Payer: Self-pay

## 2022-07-26 ENCOUNTER — Encounter (HOSPITAL_COMMUNITY): Payer: Self-pay

## 2022-07-26 ENCOUNTER — Emergency Department (HOSPITAL_BASED_OUTPATIENT_CLINIC_OR_DEPARTMENT_OTHER)
Admission: EM | Admit: 2022-07-26 | Discharge: 2022-07-26 | Disposition: A | Discharge status: Home / Self Care | Payer: Medicare HMO | Attending: Emergency Medicine | Admitting: Emergency Medicine

## 2022-07-26 DIAGNOSIS — W19XXXA Unspecified fall, initial encounter: Secondary | ICD-10-CM

## 2022-07-26 DIAGNOSIS — S0181XA Laceration without foreign body of other part of head, initial encounter: Secondary | ICD-10-CM | POA: Diagnosis not present

## 2022-07-26 DIAGNOSIS — S01511A Laceration without foreign body of lip, initial encounter: Secondary | ICD-10-CM | POA: Insufficient documentation

## 2022-07-26 DIAGNOSIS — W182XXA Fall in (into) shower or empty bathtub, initial encounter: Secondary | ICD-10-CM | POA: Insufficient documentation

## 2022-07-26 DIAGNOSIS — S0083XA Contusion of other part of head, initial encounter: Secondary | ICD-10-CM | POA: Diagnosis not present

## 2022-07-26 DIAGNOSIS — R55 Syncope and collapse: Secondary | ICD-10-CM | POA: Insufficient documentation

## 2022-07-26 DIAGNOSIS — Z043 Encounter for examination and observation following other accident: Secondary | ICD-10-CM | POA: Diagnosis not present

## 2022-07-26 DIAGNOSIS — Z1152 Encounter for screening for COVID-19: Secondary | ICD-10-CM | POA: Insufficient documentation

## 2022-07-26 LAB — COMPREHENSIVE METABOLIC PANEL
ALT: 19 U/L (ref 0–44)
AST: 28 U/L (ref 15–41)
Albumin: 4.3 g/dL (ref 3.5–5.0)
Alkaline Phosphatase: 60 U/L (ref 38–126)
Anion gap: 12 (ref 5–15)
BUN: 8 mg/dL (ref 6–20)
CO2: 27 mmol/L (ref 22–32)
Calcium: 9.6 mg/dL (ref 8.9–10.3)
Chloride: 102 mmol/L (ref 98–111)
Creatinine, Ser: 0.6 mg/dL (ref 0.44–1.00)
GFR, Estimated: >60 mL/min (ref >60)
Glucose, Bld: 112 mg/dL — ABNORMAL HIGH (ref 70–99)
Potassium: 4 mmol/L (ref 3.5–5.1)
Sodium: 141 mmol/L (ref 135–145)
Total Bilirubin: 0.8 mg/dL (ref 0.3–1.2)
Total Protein: 7.8 g/dL (ref 6.5–8.1)

## 2022-07-26 LAB — CBC WITH DIFFERENTIAL/PLATELET
Abs Immature Granulocytes: 0.03 10*3/uL (ref 0.00–0.07)
Basophils Absolute: 0 10*3/uL (ref 0.0–0.1)
Basophils Relative: 1 %
Eosinophils Absolute: 0.2 10*3/uL (ref 0.0–0.5)
Eosinophils Relative: 2 %
HCT: 36.5 % (ref 36.0–46.0)
Hemoglobin: 11.8 g/dL — ABNORMAL LOW (ref 12.0–15.0)
Immature Granulocytes: 0 %
Lymphocytes Relative: 30 %
Lymphs Abs: 2.3 10*3/uL (ref 0.7–4.0)
MCH: 29.9 pg (ref 26.0–34.0)
MCHC: 32.3 g/dL (ref 30.0–36.0)
MCV: 92.6 fL (ref 80.0–100.0)
Monocytes Absolute: 0.8 10*3/uL (ref 0.1–1.0)
Monocytes Relative: 10 %
Neutro Abs: 4.5 10*3/uL (ref 1.7–7.7)
Neutrophils Relative %: 57 %
Platelets: 229 10*3/uL (ref 150–400)
RBC: 3.94 MIL/uL (ref 3.87–5.11)
RDW: 15.1 % (ref 11.5–15.5)
WBC: 7.9 10*3/uL (ref 4.0–10.5)
nRBC: 0 % (ref 0.0–0.2)

## 2022-07-26 LAB — RESP PANEL BY RT-PCR (RSV, FLU A&B, COVID)  RVPGX2
Influenza A by PCR: NEGATIVE
Influenza B by PCR: NEGATIVE
Resp Syncytial Virus by PCR: NEGATIVE
SARS Coronavirus 2 by RT PCR: NEGATIVE

## 2022-07-26 MED ORDER — LIDOCAINE HCL 2 % IJ SOLN
5.0000 mL | Freq: Once | INTRAMUSCULAR | Status: AC
  Administered 2022-07-26: 100 mg
  Filled 2022-07-26: qty 20

## 2022-07-26 NOTE — ED Triage Notes (Signed)
Patient arrives to ED POV c/o facial pain. Pt fell this morning in the shower. Pt states she recently gave birth and has been feeling weak and not resting. Pt has laceration to RT upper lip and cuts to her chin. Swelling to lips

## 2022-07-26 NOTE — ED Notes (Signed)
Called pt for triage, No answer.

## 2022-07-26 NOTE — ED Notes (Signed)
Called patient for triage, no answer

## 2022-07-26 NOTE — ED Notes (Signed)
Patient is being discharged from the Urgent Care and sent to the Emergency Department via personal vehicle. Per Nyoka Lint PA, patient is in need of higher level of care due to possible jaw fracture. Patient is aware and verbalizes understanding of plan of care.  Vitals:   07/26/22 1730  BP: 138/84  Pulse: 96  Resp: 16  Temp: 98.2 F (36.8 C)  SpO2: 98%

## 2022-07-26 NOTE — Discharge Instructions (Signed)
You were seen in the emergency department for a fall. All of your imaging that was performed in the ED today were negative for any bleeding or fractures. Laceration repair was performed on your lip with sutures that are absorbable. Use Neosporin on the laceration on your lip to improve wound healing and improve scaring. If you develop any significant redness at the site where stitches were placed, please come back into the ED for further evaluation.

## 2022-07-26 NOTE — ED Provider Notes (Signed)
[facility]    CSN: [id number] Arrival date & time: 07/26/22  1527      History   Chief Complaint Chief Complaint  Patient presents with   Fall   Facial Laceration    HPI Brianna Booth is a 37 y.o. female.   37 year old female presents with right upper lip laceration and jaw pain.  Patient indicates this morning she slipped and fell in the shower striking her right lip and lacerating it.  She also struck the right side of her jaw.  Patient indicates that she just delivered recently and her infant is in NICU and she has been running back and forth visiting.  Patient indicates that she is been tired and fatigued recently and this is what caused her to fall in the shower.  She denies having LOC.  She is without nausea, vomiting, headache, or vision changes.  She indicates that she tried to eat today and is not able to chew on the right side due to the jaw pain.  She indicates she does have a history of Bell's palsy on the right side in the past.   Fall    Past Medical History:  Diagnosis Date   Anxiety    Asthma    Bipolar 1 disorder (HCC)    Depression    Essential hypertension 02/22/2019   Headache    Preterm labor    Schizo-affective schizophrenia [facility])     Patient Active Problem List   Diagnosis Date Noted   Retained placenta 07/12/2022   Pre-eclampsia 07/11/2022   Severe pre-eclampsia in third trimester 07/11/2022   Chronic hypertension with superimposed preeclampsia 07/08/2022   Schizophrenia (HCC) 02/22/2019   Mild intermittent asthma without complication 02/22/2019   Essential hypertension 02/22/2019   Bipolar 1 disorder, depressed, moderate (HCC) 02/22/2019   History of preterm delivery, currently pregnant 06/12/2018   Anxiety and depression 06/12/2018   Hx of preeclampsia, prior pregnancy, currently pregnant, second trimester 02/23/2018   Bell's palsy 03/05/2017    Past Surgical History:  Procedure Laterality Date   BACK SURGERY     DILATION  AND CURETTAGE OF UTERUS     DILATION AND CURETTAGE OF UTERUS N/A 07/12/2022   Procedure: DILATATION AND CURETTAGE;  Surgeon: Conard Novak, MD;  Location: [facility] ORS;  Service: Gynecology;  Laterality: N/A;    OB History     Gravida  14   Para  9   Term  3   Preterm  6   AB  5   Living  9      SAB  3   IAB  2   Ectopic      Multiple  0   Live Births  9            Home Medications    Prior to Admission medications   Medication Sig Start Date End Date Taking? Authorizing Provider  ARIPiprazole (ABILIFY) 20 MG tablet Take 20 mg by mouth daily.   Yes [provider]  clonazePAM (KLONOPIN) 1 MG tablet Take 1 mg by mouth 3 (three) times daily. 07/19/22  Yes [provider]  CLONIDINE HCL PO Take by mouth.   Yes [provider]  NIFEdipine (ADALAT CC) 60 MG 24 hr tablet Take 1 tablet (60 mg total) by mouth daily. 07/15/22  Yes Gustavo Lah, CNM  pantoprazole (PROTONIX) 40 MG tablet Take 1 tablet (40 mg total) by mouth 2 (two) times daily. 07/15/22  Yes Gustavo Lah, CNM  acetaminophen (TYLENOL)  500 MG tablet Take 2 tablets (1,000 mg total) by mouth every 4 (four) hours as needed (for pain scale < 4  OR  temperature  >/=  100.5 F). 07/15/22   Minda Meo, CNM  albuterol (PROVENTIL HFA;VENTOLIN HFA) 108 (90 Base) MCG/ACT inhaler Inhale 1-2 puffs into the lungs every 6 (six) hours as needed for wheezing or shortness of breath.    [provider]  cyclobenzaprine (FLEXERIL) 10 MG tablet Take 1 tablet (10 mg total) by mouth 3 (three) times daily as needed for muscle spasms. 07/15/22   Minda Meo, CNM  enoxaparin (LOVENOX) 60 MG/0.6ML injection Inject 0.6 mLs (60 mg total) into the skin daily for 21 days. 07/15/22 08/05/22  Minda Meo, CNM  ferrous sulfate 325 (65 FE) MG tablet Take 1 tablet (325 mg total) by mouth 2 (two) times daily with a meal. 07/15/22   Minda Meo, CNM  ibuprofen (ADVIL) 600 MG tablet Take 1 tablet  (600 mg total) by mouth every 6 (six) hours. 07/15/22   Minda Meo, CNM    Family History Family History  Problem Relation Age of Onset   Hypertension Father    Diabetes Brother    Diabetes Maternal Aunt    Asthma Maternal Grandfather    Cancer Neg Hx    Heart disease Neg Hx     Social History Social History   Tobacco Use   Smoking status: Some Days    Types: Cigarettes    Last attempt to quit: 12/12/2017    Years since quitting: 4.6   Smokeless tobacco: Never  Vaping Use   Vaping Use: Never used  Substance Use Topics   Alcohol use: Not Currently   Drug use: Yes    Types: Marijuana    Comment: last use may 2023     Allergies   Patient has no known allergies.   Review of Systems Review of Systems  Skin:  Positive for wound (right upper lip laceration).     Physical Exam Triage Vital Signs ED Triage Vitals  Enc Vitals Group     BP 07/26/22 1730 138/84     Pulse Rate 07/26/22 1730 96     Resp 07/26/22 1730 16     Temp 07/26/22 1730 98.2 F (36.8 C)     Temp Source 07/26/22 1730 Oral     SpO2 07/26/22 1730 98 %     Weight 07/26/22 1730 246 lb 14.6 oz (112 kg)     Height 07/26/22 1730 5\' 7"  (1.702 m)     Head Circumference --      Peak Flow --      Pain Score 07/26/22 1727 10     Pain Loc --      Pain Edu? --      Excl. in [place]? --    No data found.  Updated Vital Signs BP 138/84 (BP Location: Right Arm)   Pulse 96   Temp 98.2 F (36.8 C) (Oral)   Resp 16   Ht 5\' 7"  (1.702 m)   Wt 246 lb 14.6 oz (112 kg)   LMP 11/26/2021   SpO2 98%   Breastfeeding Yes   BMI 38.67 kg/m   Visual Acuity Right Eye Distance:   Left Eye Distance:   Bilateral Distance:    Right Eye Near:   Left Eye Near:    Bilateral Near:     Physical Exam Constitutional:      Appearance: Normal appearance.  HENT:  Head:      Comments: Face: There is tenderness on palpation of the right ramus area of the jaw, with swelling being 1+.  Patient is unable to fully  open the mouth due to the pain of the right jaw on motion.    Mouth/Throat:     Mouth: Mucous membranes are moist.     Pharynx: Oropharynx is clear.      Comments: Face: There is a 1 cm laceration on the upper outer margin of the right lip that involves the vermilion border.  Mild swelling is present.  There are other superficial abrasions on the upper lip and the lower left chin area. Neck:     Comments: Neck: Range of motion is normal however there is mild pain on flexion, extension, and rotation to the right and left without crepitus on motion. Neurological:     Mental Status: She is alert.      UC Treatments / Results  Labs (all labs ordered are listed, but only abnormal results are displayed) Labs Reviewed - No data to display  EKG   Radiology No results found.  Procedures Procedures (including critical care time)  Medications Ordered in UC Medications - No data to display  Initial Impression / Assessment and Plan / UC Course  I have reviewed the triage vital signs and the nursing notes.  Pertinent labs & imaging results that were available during my care of the patient were reviewed by me and considered in my medical decision making (see chart for details).    Plan: 1.  Contusion of the right jaw will be treated with the following: A.  Patient is advised to report to the emergency room for a higher level of care to rule out a possible right jaw fracture. 2.  The lip laceration be treated following: A.  Lip laceration should be repaired after the determination of a possible right jaw fracture. 3.  Advised follow-up PCP or return to urgent care as needed. Final Clinical Impressions(s) / UC Diagnoses   Final diagnoses:  Contusion of jaw, initial encounter  Lip laceration, initial encounter     Discharge Instructions      Advised to report to the emergency room for evaluation of a possible right jaw fracture.  (The lip laceration can be repaired after ruling  out of a jaw fracture)    ED Prescriptions   None    PDMP not reviewed this encounter.   Ellsworth Lennox, PA-C 07/26/22 1805

## 2022-07-26 NOTE — ED Triage Notes (Signed)
Chief Complaint: Patient fell in the shower and cut the upper right lip. Patient states she felt weak and fell. States she just gave birth and has not been allowing herself rest. No LOC.   Onset: this morning.   Prescriptions or OTC medications tried: No

## 2022-07-26 NOTE — ED Provider Notes (Addendum)
 [facility] EMERGENCY DEPT Provider Note   CSN: [id number] Arrival date & time: 07/26/22  1845     History Chief Complaint  Patient presents with   Marletta Lor    Brianna Booth is a 37 y.o. female.   Fall  Patient presents emergency department following a fall.  She reports that she was in the shower when she fell.  She does not believe that she had a syncopal episode because she recalls the fall and detail.  Patient denies any headaches, nausea, vomiting, abdominal pain.  Patient did also experience a laceration to the right upper lip.  No active bleeding at this time.     Home Medications Prior to Admission medications   Medication Sig Start Date End Date Taking? Authorizing Provider  acetaminophen (TYLENOL) 500 MG tablet Take 2 tablets (1,000 mg total) by mouth every 4 (four) hours as needed (for pain scale < 4  OR  temperature  >/=  100.5 F). 07/15/22   Gustavo Lah, CNM  albuterol (PROVENTIL HFA;VENTOLIN HFA) 108 (90 Base) MCG/ACT inhaler Inhale 1-2 puffs into the lungs every 6 (six) hours as needed for wheezing or shortness of breath.    [provider]  ARIPiprazole (ABILIFY) 20 MG tablet Take 20 mg by mouth daily.    [provider]  clonazePAM (KLONOPIN) 1 MG tablet Take 1 mg by mouth 3 (three) times daily. 07/19/22   [provider]  CLONIDINE HCL PO Take by mouth.    [provider]  cyclobenzaprine (FLEXERIL) 10 MG tablet Take 1 tablet (10 mg total) by mouth 3 (three) times daily as needed for muscle spasms. 07/15/22   Gustavo Lah, CNM  enoxaparin (LOVENOX) 60 MG/0.6ML injection Inject 0.6 mLs (60 mg total) into the skin daily for 21 days. 07/15/22 08/05/22  Gustavo Lah, CNM  ferrous sulfate 325 (65 FE) MG tablet Take 1 tablet (325 mg total) by mouth 2 (two) times daily with a meal. 07/15/22   Gustavo Lah, CNM  ibuprofen (ADVIL) 600 MG tablet Take 1 tablet (600 mg total) by mouth every 6 (six) hours. 07/15/22   Gustavo Lah, CNM  NIFEdipine (ADALAT CC) 60 MG 24 hr tablet Take 1 tablet (60 mg total) by mouth daily. 07/15/22   Gustavo Lah, CNM  pantoprazole (PROTONIX) 40 MG tablet Take 1 tablet (40 mg total) by mouth 2 (two) times daily. 07/15/22   Gustavo Lah, CNM      Allergies    Patient has no known allergies.    Review of Systems   Review of Systems  Physical Exam Updated Vital Signs BP (!) 112/51 (BP Location: Left Arm)   Pulse 76   Temp 97.9 F (36.6 C) (Oral)   Resp 16   Ht 5\' 7"  (1.702 m)   Wt 112 kg   LMP 11/26/2021   SpO2 100%   BMI 38.67 kg/m  Physical Exam  ED Results / Procedures / Treatments   Labs (all labs ordered are listed, but only abnormal results are displayed) Labs Reviewed  COMPREHENSIVE METABOLIC PANEL - Abnormal; Notable for the following components:      Result Value   Glucose, Bld 112 (*)    All other components within normal limits  CBC WITH DIFFERENTIAL/PLATELET - Abnormal; Notable for the following components:   Hemoglobin 11.8 (*)    All other components within normal limits  RESP PANEL BY RT-PCR (RSV, FLU A&B, COVID)  [id number]    EKG None  Radiology CT Maxillofacial Wo Contrast  Result Date: 07/26/2022 CLINICAL DATA:  Blunt facial trauma, fall, facial laceration EXAM: CT HEAD WITHOUT CONTRAST CT MAXILLOFACIAL WITHOUT CONTRAST CT CERVICAL SPINE WITHOUT CONTRAST TECHNIQUE: Multidetector CT imaging of the head, cervical spine, and maxillofacial structures were performed using the standard protocol without intravenous contrast. Multiplanar CT image reconstructions of the cervical spine and maxillofacial structures were also generated. RADIATION DOSE REDUCTION: This exam was performed according to the departmental dose-optimization program which includes automated exposure control, adjustment of the mA and/or kV according to patient size and/or use of iterative reconstruction technique. COMPARISON:  None Available. FINDINGS: CT HEAD FINDINGS Brain: Normal  anatomic configuration. No abnormal intra or extra-axial mass lesion or fluid collection. No abnormal mass effect or midline shift. No evidence of acute intracranial hemorrhage or infarct. Ventricular size is normal. Cerebellum unremarkable. Vascular: Unremarkable Skull: Intact Other: Mastoid air cells and middle ear cavities are clear. CT MAXILLOFACIAL FINDINGS Osseous: No fracture or mandibular dislocation. No destructive process. Orbits: Negative. No traumatic or inflammatory finding. Sinuses: Clear. Soft tissues: Mild soft tissue swelling superficial to the supraorbital ridge. Moderate soft tissue swelling superficial to the left mandibular body. CT CERVICAL SPINE FINDINGS Alignment: Normal. Skull base and vertebrae: No acute fracture. No primary bone lesion or focal pathologic process. Soft tissues and spinal canal: No prevertebral fluid or swelling. No visible canal hematoma. Disc levels: Intervertebral disc heights are preserved. Prevertebral soft tissues are not thickened on sagittal reformats. Spinal canal is widely patent. Mild left neuroforaminal narrowing at C2-3. Remaining neural foramina are widely patent. Upper chest: Negative. Other: None IMPRESSION: 1. No acute intracranial injury. No calvarial fracture. 2. No acute facial fracture. Mild soft tissue swelling superficial to the supraorbital ridge and moderate soft tissue swelling superficial to the left mandibular body. 3. No acute fracture or listhesis of the cervical spine. Electronically Signed   By: Helyn Numbers M.D.   On: 07/26/2022 20:14   CT Head Wo Contrast  Result Date: 07/26/2022 CLINICAL DATA:  Blunt facial trauma, fall, facial laceration EXAM: CT HEAD WITHOUT CONTRAST CT MAXILLOFACIAL WITHOUT CONTRAST CT CERVICAL SPINE WITHOUT CONTRAST TECHNIQUE: Multidetector CT imaging of the head, cervical spine, and maxillofacial structures were performed using the standard protocol without intravenous contrast. Multiplanar CT image  reconstructions of the cervical spine and maxillofacial structures were also generated. RADIATION DOSE REDUCTION: This exam was performed according to the departmental dose-optimization program which includes automated exposure control, adjustment of the mA and/or kV according to patient size and/or use of iterative reconstruction technique. COMPARISON:  None Available. FINDINGS: CT HEAD FINDINGS Brain: Normal anatomic configuration. No abnormal intra or extra-axial mass lesion or fluid collection. No abnormal mass effect or midline shift. No evidence of acute intracranial hemorrhage or infarct. Ventricular size is normal. Cerebellum unremarkable. Vascular: Unremarkable Skull: Intact Other: Mastoid air cells and middle ear cavities are clear. CT MAXILLOFACIAL FINDINGS Osseous: No fracture or mandibular dislocation. No destructive process. Orbits: Negative. No traumatic or inflammatory finding. Sinuses: Clear. Soft tissues: Mild soft tissue swelling superficial to the supraorbital ridge. Moderate soft tissue swelling superficial to the left mandibular body. CT CERVICAL SPINE FINDINGS Alignment: Normal. Skull base and vertebrae: No acute fracture. No primary bone lesion or focal pathologic process. Soft tissues and spinal canal: No prevertebral fluid or swelling. No visible canal hematoma. Disc levels: Intervertebral disc heights are preserved. Prevertebral soft tissues are not thickened on sagittal reformats. Spinal canal is widely patent. Mild left neuroforaminal narrowing at C2-3. Remaining neural foramina are  widely patent. Upper chest: Negative. Other: None IMPRESSION: 1. No acute intracranial injury. No calvarial fracture. 2. No acute facial fracture. Mild soft tissue swelling superficial to the supraorbital ridge and moderate soft tissue swelling superficial to the left mandibular body. 3. No acute fracture or listhesis of the cervical spine. Electronically Signed   By: Helyn Numbers M.D.   On: 07/26/2022  20:14   CT Cervical Spine Wo Contrast  Result Date: 07/26/2022 CLINICAL DATA:  Blunt facial trauma, fall, facial laceration EXAM: CT HEAD WITHOUT CONTRAST CT MAXILLOFACIAL WITHOUT CONTRAST CT CERVICAL SPINE WITHOUT CONTRAST TECHNIQUE: Multidetector CT imaging of the head, cervical spine, and maxillofacial structures were performed using the standard protocol without intravenous contrast. Multiplanar CT image reconstructions of the cervical spine and maxillofacial structures were also generated. RADIATION DOSE REDUCTION: This exam was performed according to the departmental dose-optimization program which includes automated exposure control, adjustment of the mA and/or kV according to patient size and/or use of iterative reconstruction technique. COMPARISON:  None Available. FINDINGS: CT HEAD FINDINGS Brain: Normal anatomic configuration. No abnormal intra or extra-axial mass lesion or fluid collection. No abnormal mass effect or midline shift. No evidence of acute intracranial hemorrhage or infarct. Ventricular size is normal. Cerebellum unremarkable. Vascular: Unremarkable Skull: Intact Other: Mastoid air cells and middle ear cavities are clear. CT MAXILLOFACIAL FINDINGS Osseous: No fracture or mandibular dislocation. No destructive process. Orbits: Negative. No traumatic or inflammatory finding. Sinuses: Clear. Soft tissues: Mild soft tissue swelling superficial to the supraorbital ridge. Moderate soft tissue swelling superficial to the left mandibular body. CT CERVICAL SPINE FINDINGS Alignment: Normal. Skull base and vertebrae: No acute fracture. No primary bone lesion or focal pathologic process. Soft tissues and spinal canal: No prevertebral fluid or swelling. No visible canal hematoma. Disc levels: Intervertebral disc heights are preserved. Prevertebral soft tissues are not thickened on sagittal reformats. Spinal canal is widely patent. Mild left neuroforaminal narrowing at C2-3. Remaining neural foramina  are widely patent. Upper chest: Negative. Other: None IMPRESSION: 1. No acute intracranial injury. No calvarial fracture. 2. No acute facial fracture. Mild soft tissue swelling superficial to the supraorbital ridge and moderate soft tissue swelling superficial to the left mandibular body. 3. No acute fracture or listhesis of the cervical spine. Electronically Signed   By: Helyn Numbers M.D.   On: 07/26/2022 20:14    Procedures .Marland KitchenLaceration Repair  Date/Time: 07/26/2022 11:26 PM  Performed by: Smitty Knudsen, PA-C Authorized by: Smitty Knudsen, PA-C   Consent:    Consent obtained:  Verbal   Consent given by:  Patient   Risks, benefits, and alternatives were discussed: yes     Risks discussed:  Infection and pain   Alternatives discussed:  Referral Universal protocol:    Test results available: yes     Imaging studies available: yes     Patient identity confirmed:  Verbally with patient Anesthesia:    Anesthesia method:  Local infiltration   Local anesthetic:  Lidocaine 2% w/o epi Laceration details:    Location:  Lip   Lip location:  Upper exterior lip   Length (cm):  2   Depth (mm):  1 Pre-procedure details:    Preparation:  Patient was prepped and draped in usual sterile fashion and imaging obtained to evaluate for foreign bodies Exploration:    Limited defect created (wound extended): yes     Hemostasis achieved with:  Direct pressure   Imaging outcome: foreign body not noted     Contaminated: no  Treatment:    Area cleansed with:  Saline   Amount of cleaning:  Standard   Irrigation solution:  Sterile saline   Irrigation method:  Syringe   Visualized foreign bodies/material removed: no     Debridement:  None   Undermining:  None   Scar revision: no   Skin repair:    Repair method:  Sutures   Suture size:  6-0   Suture material:  Chromic gut   Suture technique:  Simple interrupted   Number of sutures:  3 Approximation:    Approximation:  Close   [place] border  well-aligned: yes   Repair type:    Repair type:  Simple Post-procedure details:    Dressing:  Open (no dressing)   Procedure completion:  Tolerated    Medications Ordered in ED Medications  lidocaine (XYLOCAINE) 2 % (with pres) injection 100 mg (100 mg Infiltration Given 07/26/22 2147)    ED Course/ Medical Decision Making/ A&P                           Medical Decision Making Amount and/or Complexity of Data Reviewed Labs: ordered. Radiology: ordered.  Risk Prescription drug management.   This patient presents to the ED for concern of fall.  Differential diagnosis includes syncope, viral URI, anemia   Lab Tests:  I Ordered, and personally interpreted labs.  The pertinent results include: Normal CBC, CMP and negative respiratory viral panel.   Imaging Studies ordered:  I ordered imaging studies including CT head, CT cervical spine, CT maxillofacial I independently visualized and interpreted imaging which showed no intracranial injury, no acute facial fracture I agree with the radiologist interpretation   Medicines ordered and prescription drug management:  I have reviewed the patients home medicines and have made adjustments as needed   Problem List / ED Course:  Patient presented to the emergency department following a fall in her shower.  She reports that she does not believe she had a full syncopal episode as she was mostly conscious during the incident.  She reports that she now has a laceration on the right upper lip.  Based on presentation imaging was done of her head and face as well as neck with no acute evidence of any intracranial injury or fractures seen. Suture repair performed which patient tolerated well. Advised patient of return precautions if evidence of infection occurs at the site.   Final Clinical Impression(s) / ED Diagnoses Final diagnoses:  Fall, initial encounter  Lip laceration, initial encounter    Rx / DC Orders ED Discharge Orders      None         Salomon Mast 07/26/22 2329    Arby Barrette, MD 07/29/22 1538

## 2022-07-26 NOTE — Discharge Instructions (Addendum)
Advised to report to the emergency room for evaluation of a possible right jaw fracture.  (The lip laceration can be repaired after ruling out of a jaw fracture)

## 2022-07-28 ENCOUNTER — Telehealth: Payer: Self-pay

## 2022-07-28 ENCOUNTER — Ambulatory Visit: Payer: Self-pay

## 2022-07-28 NOTE — Lactation Note (Signed)
This note was copied from a baby's chart. Lactation Consultation Note  Patient Name: Boy Aaliayah Miao [id number] Date: 07/28/2022   Age:36 wk.o.  Lactation orders received and acknowledged. Family had already left the facility by the time this [place] came in the room, will F/U with family tomorrow to check on pumping status.   Suellen Durocher S Buffie Herne 07/28/2022, 7:08 PM

## 2022-07-29 ENCOUNTER — Ambulatory Visit: Payer: Self-pay

## 2022-07-29 NOTE — Lactation Note (Signed)
This note was copied from a baby's chart.  NICU Lactation Consultation Note  Patient Name: Brianna Booth [id number] Date: 07/29/2022 Age:36 wk.o.  Subjective Reason for consult: Follow-up assessment; NICU baby; Other (Comment); Infant < 6lbs; Late-preterm 34-36.6wks (AMA, transfer from [facility])  Visited with family of 29 76/64 weeks old (adjusted) NICU female; [name] is a P9 and has some experience breastfeeding. She was working with the LCs at [employer] right after delivery but she hasn't been pumping consistently or picked up her pump at the [facility] office; she voiced she's been very busy with her other kids. The last time she pumped was two days ago; she hasn't been bringing any EBM to the hospital because the amounts she's getting are so small. Her plan is to try taking baby to breast once her milk comes in. Explained that at 2 weeks post-partum her milk should be in; and discussed the importance of consistent pumping to protect her supply. Reviewed pumping schedule, relactation and infant feeding; let her know that if she decides that breastfeeding/pumping is not for her, we'll still be supportive of her feeding choice for baby [name].; he's currently on Similac 24 calorie formula.  Objective Infant data: Mother's Current Feeding Choice: Breast Milk and Formula  Infant feeding assessment Scale for Readiness: 2 Scale for Quality: 3  Maternal data: [id number]  Vaginal, Spontaneous Current breast feeding challenges:: NICU admission Does the patient have breastfeeding experience prior to this delivery?: Yes How long did the patient breastfeed?: She only breastfeed two of her kids, one for 3 months and the other one for 6 months Risk factor for low milk supply:: prematurity, infrequent pumping as 07/29/2022 Pump: Manual  Assessment Infant: Feeding Status: -- (Scheduled feedings)  Intervention/Plan Interventions: Breast feeding basics reviewed; Education Tools: 51F feeding tube /  Syringe  Plan of care: Encouraged bilateral pumping with a hospital grade pump every 3 hours, ideally 8 pumping sessions/24 hours She'll pick up her pump from the [facility] office   FOB present and supportive. All questions and concerns answered, family to contact [facility] services PRN.  Consult Status: NICU follow-up  NICU Follow-up type: Weekly NICU follow up   [place] 07/29/2022, 2:58 PM

## 2022-07-31 ENCOUNTER — Telehealth: Payer: Self-pay
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
## 2022-08-06 ENCOUNTER — Telehealth: Payer: Self-pay

## 2022-08-06 NOTE — Telephone Encounter (Signed)
        Patient  visited [facility] on 07/26/2022  for fall, initial encounter, lip laceration.   Telephone encounter attempt :  2nd  A HIPAA compliant voice message was left requesting a return call.  Instructed patient to call back at [phone].   [place] Resource Care Guide   [email or web address]  ?? [phone]   Website: [email or web address]  [email or web address]

## 2022-08-09 ENCOUNTER — Ambulatory Visit: Payer: Self-pay

## 2022-08-09 NOTE — Lactation Note (Signed)
This note was copied from a baby's chart.  NICU Lactation Consultation Note  Patient Name: Brianna Booth [id number] Date: 08/09/2022 Age:36 wk.o.  Subjective Reason for consult: Follow-up assessment; NICU baby; Infant < 6lbs; Other (Comment); Late-preterm 34-36.6wks (AMA, transfer from [facility])  Visited with family of 40 63/16 weeks old (adjusted) NICU female; Ms, Booth reported she's no longer pumping; she stopped on 08/07/2022. [name] is currently on Similac 24 calorie formula. Brianna Booth denies any S/S of engorgement at this time; encouraged to ice her breasts if they become painful or uncomfortable. Brianna Booth present, all questions and concerns answered, family to contact [facility] services PRN.  Objective Infant data: Mother's Current Feeding Choice: Formula  Infant feeding assessment Scale for Readiness: 2 Scale for Quality: 3  Maternal data: [id number]  Vaginal, Spontaneous Pump: Manual  Assessment Infant: In NICU  Maternal: No longer pumping  Intervention/Plan Interventions: Education  Plan: Consult Status: Complete   Brianna Booth S Shauntae Reitman 08/09/2022, 2:27 PM

## 2022-08-10 ENCOUNTER — Telehealth: Payer: Self-pay
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
## 2022-09-08 DIAGNOSIS — F819 Developmental disorder of scholastic skills, unspecified: Secondary | ICD-10-CM | POA: Diagnosis not present

## 2022-09-08 DIAGNOSIS — F4312 Post-traumatic stress disorder, chronic: Secondary | ICD-10-CM | POA: Diagnosis not present

## 2022-09-08 DIAGNOSIS — F250 Schizoaffective disorder, bipolar type: Secondary | ICD-10-CM | POA: Diagnosis not present

## 2022-10-03 ENCOUNTER — Emergency Department (HOSPITAL_BASED_OUTPATIENT_CLINIC_OR_DEPARTMENT_OTHER): Payer: Medicare HMO

## 2022-10-03 ENCOUNTER — Other Ambulatory Visit: Payer: Self-pay

## 2022-10-03 ENCOUNTER — Emergency Department (HOSPITAL_BASED_OUTPATIENT_CLINIC_OR_DEPARTMENT_OTHER): Payer: Medicare HMO | Admitting: Radiology

## 2022-10-03 ENCOUNTER — Emergency Department (HOSPITAL_BASED_OUTPATIENT_CLINIC_OR_DEPARTMENT_OTHER)
Admission: EM | Admit: 2022-10-03 | Discharge: 2022-10-04 | Disposition: A | Discharge status: Home / Self Care | Payer: Medicare HMO | Attending: Emergency Medicine | Admitting: Emergency Medicine

## 2022-10-03 ENCOUNTER — Encounter (HOSPITAL_BASED_OUTPATIENT_CLINIC_OR_DEPARTMENT_OTHER): Payer: Self-pay

## 2022-10-03 DIAGNOSIS — M25512 Pain in left shoulder: Secondary | ICD-10-CM | POA: Diagnosis not present

## 2022-10-03 DIAGNOSIS — B9689 Other specified bacterial agents as the cause of diseases classified elsewhere: Secondary | ICD-10-CM | POA: Diagnosis not present

## 2022-10-03 DIAGNOSIS — S199XXA Unspecified injury of neck, initial encounter: Secondary | ICD-10-CM | POA: Diagnosis not present

## 2022-10-03 DIAGNOSIS — M2578 Osteophyte, vertebrae: Secondary | ICD-10-CM | POA: Diagnosis not present

## 2022-10-03 DIAGNOSIS — N760 Acute vaginitis: Secondary | ICD-10-CM | POA: Diagnosis not present

## 2022-10-03 DIAGNOSIS — M25511 Pain in right shoulder: Secondary | ICD-10-CM | POA: Diagnosis not present

## 2022-10-03 DIAGNOSIS — W500XXA Accidental hit or strike by another person, initial encounter: Secondary | ICD-10-CM | POA: Insufficient documentation

## 2022-10-03 DIAGNOSIS — R4182 Altered mental status, unspecified: Secondary | ICD-10-CM | POA: Diagnosis not present

## 2022-10-03 DIAGNOSIS — S01511A Laceration without foreign body of lip, initial encounter: Secondary | ICD-10-CM | POA: Insufficient documentation

## 2022-10-03 DIAGNOSIS — S0181XA Laceration without foreign body of other part of head, initial encounter: Secondary | ICD-10-CM | POA: Insufficient documentation

## 2022-10-03 DIAGNOSIS — S34109A Unspecified injury to unspecified level of lumbar spinal cord, initial encounter: Secondary | ICD-10-CM | POA: Diagnosis not present

## 2022-10-03 DIAGNOSIS — S0990XA Unspecified injury of head, initial encounter: Secondary | ICD-10-CM | POA: Diagnosis not present

## 2022-10-03 DIAGNOSIS — S0993XA Unspecified injury of face, initial encounter: Secondary | ICD-10-CM | POA: Diagnosis not present

## 2022-10-03 DIAGNOSIS — R079 Chest pain, unspecified: Secondary | ICD-10-CM | POA: Diagnosis not present

## 2022-10-03 LAB — WET PREP, GENITAL
Sperm: NONE SEEN
Trich, Wet Prep: NONE SEEN
WBC, Wet Prep HPF POC: <10 (ref <10)
Yeast Wet Prep HPF POC: NONE SEEN

## 2022-10-03 LAB — HCG, SERUM, QUALITATIVE: Preg, Serum: NEGATIVE

## 2022-10-03 MED ORDER — IBUPROFEN 800 MG PO TABS: 800.0000 mg | ORAL_TABLET | Freq: Three times a day (TID) | ORAL | 0 refills | Status: AC

## 2022-10-03 MED ORDER — CEPHALEXIN 250 MG PO CAPS
500.0000 mg | ORAL_CAPSULE | Freq: Once | ORAL | Status: AC
  Administered 2022-10-04: 500 mg via ORAL
  Filled 2022-10-03: qty 2

## 2022-10-03 MED ORDER — CEPHALEXIN 500 MG PO CAPS: 500.0000 mg | ORAL_CAPSULE | Freq: Three times a day (TID) | ORAL | 0 refills | Status: AC

## 2022-10-03 MED ORDER — LIDOCAINE HCL 2 % IJ SOLN
INTRAMUSCULAR | Status: AC
  Filled 2022-10-03: qty 20

## 2022-10-03 MED ORDER — METRONIDAZOLE 500 MG PO TABS: 500.0000 mg | ORAL_TABLET | Freq: Two times a day (BID) | ORAL | 0 refills | Status: AC

## 2022-10-03 MED ORDER — OXYCODONE HCL 5 MG PO TABS: 10.0000 mg | ORAL_TABLET | Freq: Once | ORAL | Status: DC

## 2022-10-03 MED ORDER — IBUPROFEN 800 MG PO TABS
800.0000 mg | ORAL_TABLET | Freq: Once | ORAL | Status: AC
  Administered 2022-10-03: 800 mg via ORAL
  Filled 2022-10-03: qty 1

## 2022-10-03 MED ORDER — LIDOCAINE HCL 2 % IJ SOLN
20.0000 mL | Freq: Once | INTRAMUSCULAR | Status: DC
  Filled 2022-10-03: qty 20

## 2022-10-03 MED ORDER — ACETAMINOPHEN 500 MG PO TABS: 500.0000 mg | ORAL_TABLET | Freq: Four times a day (QID) | ORAL | 0 refills | Status: AC | PRN

## 2022-10-03 MED ORDER — OXYCODONE HCL 5 MG PO TABS
5.0000 mg | ORAL_TABLET | Freq: Once | ORAL | Status: AC
  Administered 2022-10-03: 5 mg via ORAL
  Filled 2022-10-03: qty 1

## 2022-10-03 MED ORDER — METRONIDAZOLE 500 MG PO TABS
500.0000 mg | ORAL_TABLET | Freq: Once | ORAL | Status: AC
  Administered 2022-10-03: 500 mg via ORAL
  Filled 2022-10-03: qty 1

## 2022-10-03 NOTE — Discharge Instructions (Addendum)
You came to the emergency department today with a laceration to your lip.  As we discussed, since the wound has been open for nearly 24 hours a complete repair is not indicated.  3 sutures are placed in your face you should have the wound checked early next week.  They need to be removed in 3 to 5 days.  Additionally, do not drink anything very hot, eat crunchy foods that may get stuck in your lip or wear any make-up.  You may still brush your teeth but be very careful.  Tylenol and ibuprofen are at your pharmacy for pain.  Metronidazole is the antibiotic for your bacterial vaginosis and cephalexin is the antibiotic for your lip infection.  Please follow-up with a PCP as soon as possible.  There is a wellness clinic attached to these discharge papers that I would like you to call for an appointment.  If you are unable to get in with anybody, please have your wound rechecked that either urgent care or an emergency department sometime next week.

## 2022-10-03 NOTE — ED Notes (Signed)
Patient made aware of need for urine sample.  Patient states she doesn't have to urinate at this time.
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
## 2022-10-03 NOTE — ED Notes (Signed)
Patient to CT.  Patient reports she still does not have to urinate at this time.

## 2022-10-03 NOTE — ED Notes (Signed)
Patient requesting something for pain, PA made aware.
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
## 2022-10-04 DIAGNOSIS — S01511A Laceration without foreign body of lip, initial encounter: Secondary | ICD-10-CM | POA: Diagnosis not present

## 2022-10-04 LAB — HIV ANTIBODY (ROUTINE TESTING W REFLEX): HIV Screen 4th Generation wRfx: NONREACTIVE

## 2022-10-05 LAB — GC/CHLAMYDIA PROBE AMP (~~LOC~~) NOT AT ARMC
Chlamydia: NEGATIVE
Comment: NEGATIVE
Comment: NORMAL
Neisseria Gonorrhea: POSITIVE — AB

## 2022-10-05 LAB — RPR: RPR Ser Ql: NONREACTIVE

## 2022-10-07 ENCOUNTER — Encounter (HOSPITAL_BASED_OUTPATIENT_CLINIC_OR_DEPARTMENT_OTHER): Payer: Self-pay

## 2022-10-07 ENCOUNTER — Other Ambulatory Visit: Payer: Self-pay

## 2022-10-07 ENCOUNTER — Emergency Department (HOSPITAL_BASED_OUTPATIENT_CLINIC_OR_DEPARTMENT_OTHER)
Admission: EM | Admit: 2022-10-07 | Discharge: 2022-10-07 | Disposition: A | Discharge status: Home / Self Care | Payer: Medicare HMO | Attending: Emergency Medicine | Admitting: Emergency Medicine

## 2022-10-07 DIAGNOSIS — I10 Essential (primary) hypertension: Secondary | ICD-10-CM | POA: Insufficient documentation

## 2022-10-07 DIAGNOSIS — A549 Gonococcal infection, unspecified: Secondary | ICD-10-CM

## 2022-10-07 DIAGNOSIS — Z4801 Encounter for change or removal of surgical wound dressing: Secondary | ICD-10-CM | POA: Insufficient documentation

## 2022-10-07 DIAGNOSIS — J45909 Unspecified asthma, uncomplicated: Secondary | ICD-10-CM | POA: Diagnosis not present

## 2022-10-07 DIAGNOSIS — Z5189 Encounter for other specified aftercare: Secondary | ICD-10-CM

## 2022-10-07 MED ORDER — CEFTRIAXONE SODIUM 500 MG IJ SOLR
500.0000 mg | Freq: Once | INTRAMUSCULAR | Status: AC
  Administered 2022-10-07: 500 mg via INTRAMUSCULAR
  Filled 2022-10-07: qty 500

## 2022-10-07 MED ORDER — BACITRACIN ZINC 500 UNIT/GM EX OINT
TOPICAL_OINTMENT | Freq: Two times a day (BID) | CUTANEOUS | Status: DC
  Filled 2022-10-07: qty 28.35

## 2022-10-07 MED ORDER — LIDOCAINE HCL (PF) 1 % IJ SOLN
INTRAMUSCULAR | Status: AC
  Filled 2022-10-07: qty 5

## 2022-10-07 NOTE — ED Triage Notes (Signed)
Pt states her stitches in lip came out, unsure when, stitches from 3/16. States she is also here for medication "cause y'all told me I got gonorrhea."

## 2022-10-07 NOTE — ED Notes (Signed)
Pt verbalized understanding of d/c instructions, meds, and followup care. Denies questions. VSS, no distress noted. Steady gait to exit with all belongings.  ?

## 2022-10-07 NOTE — Discharge Instructions (Addendum)
Please apply bacitracin to the wound. I recommend close follow-up with plastic surgery for reevaluation.  Please do not hesitate to return to emergency department if worrisome signs symptoms we discussed become apparent.

## 2022-10-07 NOTE — ED Provider Notes (Signed)
[facility] Provider Note   CSN: [id number] Arrival date & time: 10/07/22  1853     History  Chief Complaint  Patient presents with   Medication Refill    Brianna Booth is a 37 y.o. female with a past medical history of asthma, bipolar 1 disorder presents today for evaluation of wound check and gonorrhea.  Patient tested positive for gonorrhea on 3/16 and requested treatment today.  Patient states her stitches and lip came out but she was not sure when.  Denies any bleeding or fluid drainage.   Medication Refill   Past Medical History:  Diagnosis Date   Anxiety    Asthma    Bipolar 1 disorder ([place])    Depression    Essential hypertension 02/22/2019   Headache    Preterm labor    Schizo-affective schizophrenia [facility])    Past Surgical History:  Procedure Laterality Date   BACK SURGERY     DILATION AND CURETTAGE OF UTERUS     DILATION AND CURETTAGE OF UTERUS N/A 07/12/2022   Procedure: DILATATION AND CURETTAGE;  Surgeon: Will Bonnet, MD;  Location: [facility] ORS;  Service: Gynecology;  Laterality: N/A;     Home Medications Prior to Admission medications   Medication Sig Start Date End Date Taking? Authorizing Provider  acetaminophen (TYLENOL) 500 MG tablet Take 2 tablets (1,000 mg total) by mouth every 4 (four) hours as needed (for pain scale < 4  OR  temperature  >/=  100.5 F). 07/15/22   Minda Meo, CNM  acetaminophen (TYLENOL) 500 MG tablet Take 1 tablet (500 mg total) by mouth every 6 (six) hours as needed. 10/03/22   Redwine, Madison A, PA-C  albuterol (PROVENTIL HFA;VENTOLIN HFA) 108 (90 Base) MCG/ACT inhaler Inhale 1-2 puffs into the lungs every 6 (six) hours as needed for wheezing or shortness of breath.    [provider]  ARIPiprazole (ABILIFY) 20 MG tablet Take 20 mg by mouth daily.    [provider]  cephALEXin (KEFLEX) 500 MG capsule Take 1 capsule (500 mg total) by mouth 3 (three) times  daily. 10/03/22   Redwine, Madison A, PA-C  clonazePAM (KLONOPIN) 1 MG tablet Take 1 mg by mouth 3 (three) times daily. 07/19/22   [provider]  CLONIDINE HCL PO Take by mouth.    [provider]  cyclobenzaprine (FLEXERIL) 10 MG tablet Take 1 tablet (10 mg total) by mouth 3 (three) times daily as needed for muscle spasms. 07/15/22   Minda Meo, CNM  enoxaparin (LOVENOX) 60 MG/0.6ML injection Inject 0.6 mLs (60 mg total) into the skin daily for 21 days. 07/15/22 08/05/22  Minda Meo, CNM  ferrous sulfate 325 (65 FE) MG tablet Take 1 tablet (325 mg total) by mouth 2 (two) times daily with a meal. 07/15/22   Minda Meo, CNM  ibuprofen (ADVIL) 600 MG tablet Take 1 tablet (600 mg total) by mouth every 6 (six) hours. 07/15/22   Minda Meo, CNM  ibuprofen (ADVIL) 800 MG tablet Take 1 tablet (800 mg total) by mouth 3 (three) times daily. 10/03/22   Redwine, Madison A, PA-C  metroNIDAZOLE (FLAGYL) 500 MG tablet Take 1 tablet (500 mg total) by mouth 2 (two) times daily. 10/03/22   Redwine, Madison A, PA-C  NIFEdipine (ADALAT CC) 60 MG 24 hr tablet Take 1 tablet (60 mg total) by mouth daily. 07/15/22   Minda Meo, CNM  pantoprazole (PROTONIX) 40 MG tablet Take 1  tablet (40 mg total) by mouth 2 (two) times daily. 07/15/22   Minda Meo, CNM      Allergies    Patient has no known allergies.    Review of Systems   Review of Systems Negative except as per HPI.  Physical Exam Updated Vital Signs BP (!) 163/101 (BP Location: Right Arm)   Pulse 80   Temp 98.1 F (36.7 C)   Resp 18   LMP 09/01/2022 (Approximate)   SpO2 99%  Physical Exam Vitals and nursing note reviewed.  Constitutional:      Appearance: Normal appearance.  HENT:     Head: Normocephalic and atraumatic.     Mouth/Throat:     Mouth: Mucous membranes are moist.     Comments: Healing laceration on R lower lip with some dead skin around the wound. Laceration seems to be approximated under the  dead skin. No sutures noted on my exam. Eyes:     General: No scleral icterus. Cardiovascular:     Rate and Rhythm: Normal rate and regular rhythm.     Pulses: Normal pulses.     Heart sounds: Normal heart sounds.  Pulmonary:     Effort: Pulmonary effort is normal.     Breath sounds: Normal breath sounds.  Abdominal:     General: Abdomen is flat.     Palpations: Abdomen is soft.     Tenderness: There is no abdominal tenderness.  Musculoskeletal:        General: No deformity.  Skin:    General: Skin is warm.     Findings: No rash.  Neurological:     General: No focal deficit present.     Mental Status: She is alert.  Psychiatric:        Mood and Affect: Mood normal.     ED Results / Procedures / Treatments   Labs (all labs ordered are listed, but only abnormal results are displayed) Labs Reviewed - No data to display  EKG None  Radiology No results found.  Procedures Procedures    Medications Ordered in ED Medications  cefTRIAXone (ROCEPHIN) injection 500 mg (500 mg Intramuscular Given 10/07/22 2208)    ED Course/ Medical Decision Making/ A&P                             Medical Decision Making Risk OTC drugs. Prescription drug management.   This patient presents to the ED for gonorrhea and wound check, this involves an extensive number of treatment options, and is a complaint that carries with a high risk of complications and morbidity.  The differential diagnosis includes gonorrhea, laceration.  This is not an exhaustive list.  Lab tests: Gonorrhea test ordered on 10/03/2022 positive.  Problem list/ ED course/ Critical interventions/ Medical management: HPI: See above Vital signs within normal range and stable throughout visit. Laboratory/imaging studies significant for: See above. On physical examination, patient is afebrile and appears in no acute distress.  Gonorrhea test ordered on 10/03/2022 was positive.  Ceftriaxone ordered.  There was also a  healing laceration on R lower lip with some dead skin around the wound. Laceration seems to be approximated under the dead skin. No sutures noted on my exam.  Patient states her suture came out but not sure when.  Based on the appearance of the wound which appeared to be approximated I do not think additional sutures needed.  Patient requested referral to plastic surgery because the laceration is  on the face.  Plastic surgery information given.  Bacitracin also order an infection.  Advised patient to follow-up with PCP for further evaluation and management, return to the ER if new or worsening I have reviewed the patient home medicines and have made adjustments as needed.  Cardiac monitoring/EKG: The patient was maintained on a cardiac monitor.  I personally reviewed and interpreted the cardiac monitor which showed an underlying rhythm of: sinus rhythm.  Additional history obtained: External records from outside source obtained and reviewed including: Chart review including previous notes, labs, imaging.  Consultations obtained:  Disposition Continued outpatient therapy. Follow-up with PCP recommended for reevaluation of symptoms. Treatment plan discussed with patient.  Pt acknowledged understanding was agreeable to the plan. Worrisome signs and symptoms were discussed with patient, and patient acknowledged understanding to return to the ED if they noticed these signs and symptoms. Patient was stable upon discharge.   This chart was dictated using voice recognition software.  Despite best efforts to proofread,  errors can occur which can change the documentation meaning.          Final Clinical Impression(s) / ED Diagnoses Final diagnoses:  Gonorrhea  Visit for wound check    Rx / DC Orders ED Discharge Orders     None         Rex Kras, [place] 10/08/22 1122    Lajean Saver, MD 10/19/22 [phone]

## 2022-10-13 ENCOUNTER — Telehealth: Payer: Self-pay

## 2022-10-13 NOTE — Telephone Encounter (Signed)
     Patient  visit on 10/07/2022  at [facility] was for medication refill.  Have you been able to follow up with your primary care physician? Patient is following up with a Psychiatric nurse.  The patient was or was not able to obtain any needed medicine or equipment. No medication prescribed.  Are there diet recommendations that you are having difficulty following? No  Patient expresses understanding of discharge instructions and education provided has no other needs at this time. Yes   [place] Resource Care Guide   [email or web address]  ?? [id number]   Website: [email or web address]  [email or web address]

## 2022-12-07 DIAGNOSIS — F4312 Post-traumatic stress disorder, chronic: Secondary | ICD-10-CM | POA: Diagnosis not present

## 2022-12-07 DIAGNOSIS — F250 Schizoaffective disorder, bipolar type: Secondary | ICD-10-CM | POA: Diagnosis not present

## 2022-12-07 DIAGNOSIS — F819 Developmental disorder of scholastic skills, unspecified: Secondary | ICD-10-CM | POA: Diagnosis not present

## 2023-03-31 DIAGNOSIS — F4312 Post-traumatic stress disorder, chronic: Secondary | ICD-10-CM | POA: Diagnosis not present

## 2023-03-31 DIAGNOSIS — F250 Schizoaffective disorder, bipolar type: Secondary | ICD-10-CM | POA: Diagnosis not present

## 2023-03-31 DIAGNOSIS — F819 Developmental disorder of scholastic skills, unspecified: Secondary | ICD-10-CM | POA: Diagnosis not present

## 2023-04-04 DIAGNOSIS — Z6830 Body mass index (BMI) 30.0-30.9, adult: Secondary | ICD-10-CM | POA: Diagnosis not present

## 2023-04-04 DIAGNOSIS — T148XXA Other injury of unspecified body region, initial encounter: Secondary | ICD-10-CM | POA: Diagnosis not present

## 2023-04-04 DIAGNOSIS — R030 Elevated blood-pressure reading, without diagnosis of hypertension: Secondary | ICD-10-CM | POA: Diagnosis not present

## 2023-04-04 DIAGNOSIS — E669 Obesity, unspecified: Secondary | ICD-10-CM | POA: Diagnosis not present

## 2023-04-26 DIAGNOSIS — N760 Acute vaginitis: Secondary | ICD-10-CM | POA: Diagnosis not present

## 2023-04-26 DIAGNOSIS — B3731 Acute candidiasis of vulva and vagina: Secondary | ICD-10-CM | POA: Diagnosis not present

## 2023-05-10 DIAGNOSIS — J069 Acute upper respiratory infection, unspecified: Secondary | ICD-10-CM | POA: Diagnosis not present

## 2023-05-10 DIAGNOSIS — Z20822 Contact with and (suspected) exposure to covid-19: Secondary | ICD-10-CM | POA: Diagnosis not present

## 2023-05-10 DIAGNOSIS — F17210 Nicotine dependence, cigarettes, uncomplicated: Secondary | ICD-10-CM | POA: Diagnosis not present

## 2023-05-10 DIAGNOSIS — R059 Cough, unspecified: Secondary | ICD-10-CM | POA: Diagnosis not present

## 2023-05-10 NOTE — ED Provider Notes (Signed)
 [facility]  ED Provider Note  Brianna Booth 37 y.o. female DOB: 1985-09-10 MRN: [id number] History   Chief Complaint  Patient presents with  . Cough    x3-4 days w/ headache and congestion  . Mouth/Lip Problem    Lower lip swelling after biting her lip last night    History provided by:  Patient Cough Severity:  Moderate Onset quality:  Gradual Duration:  4 days Timing:  Constant Progression:  Unchanged Chronicity:  New Relieved by:  Nothing Worsened by:  Nothing Associated symptoms: headaches and sinus congestion   Associated symptoms: no chills, no fever and no shortness of breath       No past medical history on file.  No past surgical history on file.  Social History   Substance and Sexual Activity  Alcohol  Use Yes   Social History   Tobacco Use  Smoking Status Every Day  . Packs/day: .5  . Types: Cigarettes  Smokeless Tobacco Never   E-Cigarettes  . Vaping Use    . Start Date    . Cartridges/Day    . Quit Date     Social History   Substance and Sexual Activity  Drug Use Never   Tetanus up to date?: Yes Immunizations Up to Date?: Yes   No Known Allergies  Discharge Medication List as of 05/10/2023  7:05 PM     CONTINUE these medications which have NOT CHANGED   Details  albuterol  sulfate HFA (PROAIR  HFA) 108 (90 Base) MCG/ACT inhaler Inhale two puffs into the lungs every 6 (six) hours as needed for Wheezing. Inhale two puffs every four to six hours as needed., Starting Wed 02/22/2019, Normal    ARIPiprazole  (ABILIFY ) 20 MG tablet Take one tablet (20 mg dose) by mouth daily for 7 days., Starting Wed 02/22/2019, Until Wed 03/01/2019, Normal    fluticasone-salmeterol (ADVAIR DISKUS) 500-50 mcg/dose AEPB inhalation powder Inhale one puff into the lungs 2 (two) times daily., Starting Wed 02/22/2019, Until Thu 02/22/2020, Normal    lamoTRIgine  (LAMICTAL ) 25 mg tablet Take one tablet (25 mg dose) by mouth 3 (three)  times a day for 7 days., Starting Wed 02/22/2019, Until Wed 03/01/2019, Normal    Misc. Devices MISC Blood pressure cuff, Print        Review of Systems   Review of Systems  Constitutional:  Negative for chills and fever.  Respiratory:  Positive for cough. Negative for shortness of breath.   Neurological:  Positive for headaches.  All other systems reviewed and are negative.   Physical Exam   ED Triage Vitals [05/10/23 1810]  BP 129/81  Heart Rate 79  Resp 18  SpO2 99 %  Temp 98.6 F (37 C)    Physical Exam Nursing note and vitals reviewed.  Physical Exam Constitutional: Patient is in no acute distress Head: Normocephalic and atraumatic. Eyes: Extraocular motion intact, no scleral icterus Neck: Supple without meningismus, mass, or overt JVD Respiratory: Effort normal and breath sounds normal. No respiratory distress. CV: heart: normal rate and rhythm, no obvious murmurs.  Pulses +2 and symmetric Abdomen: Soft, non-tender, non-distended MSK: Extremities are atraumatic without deformity, ROM intact, no edema Skin: Warm, dry, intact Neuro: Alert and oriented, no motor deficit, no sensory deficit noted. CN tested normal. Psychiatric: Mood and affect are normal.  ED Course   Lab results:   COVID-19, FLU A+B AND RSV - Normal      Result Value   Flu A Negative  Flu B Negative     RSV PCR Negative     SARS-COV-2 Not Detected     Narrative:    SARS-COV-2 (COVID-19)PCR-Negative results do not preclude SARS-CoV-2 infection and should not be used as the sole basis for patient management decisions. Negative results must be combined with clinical observations, patient history, and epidemiological information.  Flu and/or RSV - Negative results do not preclude the presence of Flu or RSV virus and should not be used as the sole basis for treatment or other patient management decisions. False negative results may occur if virus is present at levels below the analytical limit of  detection.  This test detects Influenza A, Influenza B, and Respiratory Syncytial Virus and SARS-COV-2 (COVID-19) by PCR.    Testing was performed using the CEPHEID SARS-CoV-2 EUA ASSAY:  The Cepheid SARS-CoV-2 EUA assay has not been FDA cleared or approved.  It has been authorized by FDA under an Emergency Use Authorization (EUA).  The test has been authorized only for the detection of nucleic acid from SARS-CoV-2, not for any other viruses or pathogens.  It is only authorized for the duration of time the declaration that circumstances exist justifying the authorization of the emergency use of in vitro diagnostic tests for detection of SARS-CoV-2 virus and/or diagnosis of COVID-19 infection under section 564(b) (1) of the Act, 21 U.S.C 360bbb-3 (b) (1), unless the authorization is terminated or revoked sooner.   Link to Patient Fact Sheet:  https://www.moore.com/   Link to Provider Fact Sheet  https://www.young.biz/      Imaging: No data to display  ECG: ECG Results   None                               Pre-Sedation Procedures    Medical Decision Making Patient presents with viral symptoms for several days.  She is well-appearing with a normal exam.  COVID flu swabs are negative.  Recommend over-the-counter's   I estimate there is LOW risk for EPIGLOTTITIS, PNEUMONIA, MENINGITIS, OR SEPSIS, thus I consider the discharge disposition reasonable. Also, there is no evidence for peritonitis, sepsis, or toxicity. We have discussed the diagnosis and risks, and we agree with discharging home to follow-up with their primary doctor. We also discussed returning to the Emergency Department immediately if new or worsening symptoms occur. We have discussed the symptoms which are most concerning (e.g., changing or worsening pain, trouble swallowing or breathing, neck stiffness, fever) that necessitate immediate return.  Nursing note and  vitals reviewed. I have reviewed and agree with the nursing documentation on past medical history, family history, and social history.  Portions of this note were dictated using Engineer, civil (consulting) services.           Provider Communication  Discharge Medication List as of 05/10/2023  7:05 PM      Discharge Medication List as of 05/10/2023  7:05 PM      Discharge Medication List as of 05/10/2023  7:05 PM      Clinical Impression Final diagnoses:  Viral URI with cough    ED Disposition     ED Disposition  Discharge   Condition  Stable   Comment  --                   Electronically signed by:    Alm CHRISTELLA Collier, MD 05/10/23 2158

## 2023-06-29 DIAGNOSIS — F819 Developmental disorder of scholastic skills, unspecified: Secondary | ICD-10-CM | POA: Diagnosis not present

## 2023-06-29 DIAGNOSIS — F250 Schizoaffective disorder, bipolar type: Secondary | ICD-10-CM | POA: Diagnosis not present

## 2023-06-29 DIAGNOSIS — F4312 Post-traumatic stress disorder, chronic: Secondary | ICD-10-CM | POA: Diagnosis not present

## 2023-07-01 DIAGNOSIS — F819 Developmental disorder of scholastic skills, unspecified: Secondary | ICD-10-CM | POA: Diagnosis not present

## 2023-07-01 DIAGNOSIS — F4312 Post-traumatic stress disorder, chronic: Secondary | ICD-10-CM | POA: Diagnosis not present

## 2023-07-01 DIAGNOSIS — F411 Generalized anxiety disorder: Secondary | ICD-10-CM | POA: Diagnosis not present

## 2023-07-01 DIAGNOSIS — F250 Schizoaffective disorder, bipolar type: Secondary | ICD-10-CM | POA: Diagnosis not present

## 2023-08-18 DIAGNOSIS — F29 Unspecified psychosis not due to a substance or known physiological condition: Secondary | ICD-10-CM | POA: Diagnosis not present

## 2023-10-20 DIAGNOSIS — F819 Developmental disorder of scholastic skills, unspecified: Secondary | ICD-10-CM | POA: Diagnosis not present

## 2023-10-20 DIAGNOSIS — F250 Schizoaffective disorder, bipolar type: Secondary | ICD-10-CM | POA: Diagnosis not present

## 2023-10-20 DIAGNOSIS — F4312 Post-traumatic stress disorder, chronic: Secondary | ICD-10-CM | POA: Diagnosis not present

## 2023-10-21 DIAGNOSIS — F4312 Post-traumatic stress disorder, chronic: Secondary | ICD-10-CM | POA: Diagnosis not present

## 2023-10-21 DIAGNOSIS — F250 Schizoaffective disorder, bipolar type: Secondary | ICD-10-CM | POA: Diagnosis not present

## 2023-10-21 DIAGNOSIS — F819 Developmental disorder of scholastic skills, unspecified: Secondary | ICD-10-CM | POA: Diagnosis not present

## 2023-10-21 DIAGNOSIS — O218 Other vomiting complicating pregnancy: Secondary | ICD-10-CM | POA: Diagnosis not present

## 2023-10-21 DIAGNOSIS — F411 Generalized anxiety disorder: Secondary | ICD-10-CM | POA: Diagnosis not present

## 2023-10-29 DIAGNOSIS — F250 Schizoaffective disorder, bipolar type: Secondary | ICD-10-CM | POA: Diagnosis not present

## 2023-10-29 DIAGNOSIS — F4312 Post-traumatic stress disorder, chronic: Secondary | ICD-10-CM | POA: Diagnosis not present

## 2023-10-29 DIAGNOSIS — F819 Developmental disorder of scholastic skills, unspecified: Secondary | ICD-10-CM | POA: Diagnosis not present

## 2023-10-29 DIAGNOSIS — F411 Generalized anxiety disorder: Secondary | ICD-10-CM | POA: Diagnosis not present

## 2023-11-12 DIAGNOSIS — F250 Schizoaffective disorder, bipolar type: Secondary | ICD-10-CM | POA: Diagnosis not present

## 2023-11-12 DIAGNOSIS — F4312 Post-traumatic stress disorder, chronic: Secondary | ICD-10-CM | POA: Diagnosis not present

## 2023-11-12 DIAGNOSIS — F411 Generalized anxiety disorder: Secondary | ICD-10-CM | POA: Diagnosis not present

## 2023-11-12 DIAGNOSIS — F819 Developmental disorder of scholastic skills, unspecified: Secondary | ICD-10-CM | POA: Diagnosis not present

## 2024-01-22 DIAGNOSIS — S46912A Strain of unspecified muscle, fascia and tendon at shoulder and upper arm level, left arm, initial encounter: Secondary | ICD-10-CM | POA: Diagnosis not present

## 2024-01-22 DIAGNOSIS — R030 Elevated blood-pressure reading, without diagnosis of hypertension: Secondary | ICD-10-CM | POA: Diagnosis not present

## 2024-01-22 DIAGNOSIS — Z6836 Body mass index (BMI) 36.0-36.9, adult: Secondary | ICD-10-CM | POA: Diagnosis not present

## 2024-01-22 DIAGNOSIS — E669 Obesity, unspecified: Secondary | ICD-10-CM | POA: Diagnosis not present

## 2024-02-28 ENCOUNTER — Emergency Department (HOSPITAL_COMMUNITY)
Admission: EM | Admit: 2024-02-28 | Discharge: 2024-02-28 | Disposition: A | Discharge status: Home / Self Care | Attending: Emergency Medicine | Admitting: Emergency Medicine

## 2024-02-28 ENCOUNTER — Encounter (HOSPITAL_COMMUNITY): Payer: Self-pay | Admitting: *Deleted

## 2024-02-28 ENCOUNTER — Emergency Department (HOSPITAL_COMMUNITY)

## 2024-02-28 ENCOUNTER — Other Ambulatory Visit: Payer: Self-pay

## 2024-02-28 DIAGNOSIS — R079 Chest pain, unspecified: Secondary | ICD-10-CM | POA: Diagnosis not present

## 2024-02-28 DIAGNOSIS — J4521 Mild intermittent asthma with (acute) exacerbation: Secondary | ICD-10-CM | POA: Diagnosis not present

## 2024-02-28 DIAGNOSIS — R0789 Other chest pain: Secondary | ICD-10-CM | POA: Diagnosis not present

## 2024-02-28 DIAGNOSIS — R059 Cough, unspecified: Secondary | ICD-10-CM | POA: Diagnosis not present

## 2024-02-28 DIAGNOSIS — R0602 Shortness of breath: Secondary | ICD-10-CM | POA: Diagnosis not present

## 2024-02-28 LAB — CBC WITH DIFFERENTIAL/PLATELET
Abs Immature Granulocytes: 0.02 K/uL (ref 0.00–0.07)
Basophils Absolute: 0 K/uL (ref 0.0–0.1)
Basophils Relative: 1 %
Eosinophils Absolute: 0.2 K/uL (ref 0.0–0.5)
Eosinophils Relative: 2 %
HCT: 38.5 % (ref 36.0–46.0)
Hemoglobin: 12.5 g/dL (ref 12.0–15.0)
Immature Granulocytes: 0 %
Lymphocytes Relative: 38 %
Lymphs Abs: 2.6 K/uL (ref 0.7–4.0)
MCH: 29.8 pg (ref 26.0–34.0)
MCHC: 32.5 g/dL (ref 30.0–36.0)
MCV: 91.9 fL (ref 80.0–100.0)
Monocytes Absolute: 0.6 K/uL (ref 0.1–1.0)
Monocytes Relative: 9 %
Neutro Abs: 3.4 K/uL (ref 1.7–7.7)
Neutrophils Relative %: 50 %
Platelets: 233 K/uL (ref 150–400)
RBC: 4.19 MIL/uL (ref 3.87–5.11)
RDW: 13.6 % (ref 11.5–15.5)
WBC: 6.8 K/uL (ref 4.0–10.5)
nRBC: 0 % (ref 0.0–0.2)

## 2024-02-28 LAB — LIPASE, BLOOD: Lipase: 34 U/L (ref 11–51)

## 2024-02-28 LAB — COMPREHENSIVE METABOLIC PANEL WITH GFR
ALT: 19 U/L (ref 0–44)
AST: 18 U/L (ref 15–41)
Albumin: 3.6 g/dL (ref 3.5–5.0)
Alkaline Phosphatase: 50 U/L (ref 38–126)
Anion gap: 9 (ref 5–15)
BUN: 10 mg/dL (ref 6–20)
CO2: 22 mmol/L (ref 22–32)
Calcium: 8.8 mg/dL — ABNORMAL LOW (ref 8.9–10.3)
Chloride: 108 mmol/L (ref 98–111)
Creatinine, Ser: 0.62 mg/dL (ref 0.44–1.00)
GFR, Estimated: >60 mL/min (ref >60)
Glucose, Bld: 96 mg/dL (ref 70–99)
Potassium: 4.3 mmol/L (ref 3.5–5.1)
Sodium: 139 mmol/L (ref 135–145)
Total Bilirubin: 0.5 mg/dL (ref 0.0–1.2)
Total Protein: 6.9 g/dL (ref 6.5–8.1)

## 2024-02-28 LAB — URINALYSIS, ROUTINE W REFLEX MICROSCOPIC
Bilirubin Urine: NEGATIVE
Glucose, UA: NEGATIVE mg/dL
Hgb urine dipstick: NEGATIVE
Ketones, ur: NEGATIVE mg/dL
Nitrite: NEGATIVE
Protein, ur: NEGATIVE mg/dL
Specific Gravity, Urine: 1.021 (ref 1.005–1.030)
pH: 6 (ref 5.0–8.0)

## 2024-02-28 LAB — TROPONIN I (HIGH SENSITIVITY): Troponin I (High Sensitivity): 3 ng/L (ref <18)

## 2024-02-28 LAB — HCG, SERUM, QUALITATIVE: Preg, Serum: NEGATIVE

## 2024-02-28 MED ORDER — IPRATROPIUM-ALBUTEROL 0.5-2.5 (3) MG/3ML IN SOLN
3.0000 mL | Freq: Once | RESPIRATORY_TRACT | Status: AC
  Administered 2024-02-28 (×2): 3 mL via RESPIRATORY_TRACT
  Filled 2024-02-28: qty 3

## 2024-02-28 MED ORDER — DEXAMETHASONE 4 MG PO TABS
12.0000 mg | ORAL_TABLET | Freq: Once | ORAL | Status: AC
  Administered 2024-02-28 (×2): 12 mg via ORAL
  Filled 2024-02-28: qty 3

## 2024-02-28 MED ORDER — IPRATROPIUM-ALBUTEROL 0.5-2.5 (3) MG/3ML IN SOLN: 3.0000 mL | RESPIRATORY_TRACT | Status: DC | PRN

## 2024-02-28 MED ORDER — ALBUTEROL SULFATE HFA 108 (90 BASE) MCG/ACT IN AERS: 2.0000 | INHALATION_SPRAY | RESPIRATORY_TRACT | 0 refills | Status: AC | PRN

## 2024-02-28 NOTE — Discharge Instructions (Addendum)
 Thank you for allowing us  to care for you today.  You came to the emergency department for shortness of breath.  We believe your shortness of breath is caused by an asthma exacerbation.  We are able to give you breathing treatments and send you home with an albuterol  inhaler to be used as needed for shortness of breath.  Please be sure to follow-up with your primary care provider to discuss asthma maintenance medications.  Please return to the emergency department if you experience worsening shortness of breath, chest pain, passout or believe you need emergent medical care.

## 2024-02-28 NOTE — ED Provider Notes (Signed)
 [facility] Provider Note   CSN: [id number] Arrival date & time: 02/28/24  1603     Patient presents with: Chest Pain   Brianna Booth is a 38 y.o. female past medical history significant for asthma who presents emergency department for shortness of breath.  Patient states that for the past 4 days she has experienced intermittent episodes of shortness of breath consistent with asthma exacerbation.  Patient endorses associated pleuritic chest pain.  Patient states that she has used her son's albuterol  nebulizer at home without relief.  Patient denies associated cough, fever, hemoptysis.  Patient currently denies chest pain however does endorse shortness of breath.  Patient denies history of blood clots in her legs or lungs, no recent surgery or diagnosis of cancer and denies recent travel.    Chest Pain      Prior to Admission medications   Medication Sig Start Date End Date Taking? Authorizing Provider  albuterol  (VENTOLIN  HFA) 108 (90 Base) MCG/ACT inhaler Inhale 2 puffs into the lungs every 4 (four) hours as needed for wheezing or shortness of breath. 02/28/24  Yes Nada Chroman, DO  acetaminophen  (TYLENOL ) 500 MG tablet Take 2 tablets (1,000 mg total) by mouth every 4 (four) hours as needed (for pain scale < 4  OR  temperature  >/=  100.5 F). 07/15/22   Vernel Therisa HERO, CNM  acetaminophen  (TYLENOL ) 500 MG tablet Take 1 tablet (500 mg total) by mouth every 6 (six) hours as needed. 10/03/22   Redwine, Madison A, PA-C  albuterol  (PROVENTIL  HFA;VENTOLIN  HFA) 108 (90 Base) MCG/ACT inhaler Inhale 1-2 puffs into the lungs every 6 (six) hours as needed for wheezing or shortness of breath.    [provider]  ARIPiprazole  (ABILIFY ) 20 MG tablet Take 20 mg by mouth daily.    [provider]  cephALEXin  (KEFLEX ) 500 MG capsule Take 1 capsule (500 mg total) by mouth 3 (three) times daily. 10/03/22   Redwine, Madison A, PA-C  clonazePAM   (KLONOPIN ) 1 MG tablet Take 1 mg by mouth 3 (three) times daily. 07/19/22   [provider]  CLONIDINE HCL PO Take by mouth.    [provider]  cyclobenzaprine  (FLEXERIL ) 10 MG tablet Take 1 tablet (10 mg total) by mouth 3 (three) times daily as needed for muscle spasms. 07/15/22   Vernel Therisa HERO, CNM  enoxaparin  (LOVENOX ) 60 MG/0.6ML injection Inject 0.6 mLs (60 mg total) into the skin daily for 21 days. 07/15/22 08/05/22  Vernel Therisa HERO, CNM  ferrous sulfate  325 (65 FE) MG tablet Take 1 tablet (325 mg total) by mouth 2 (two) times daily with a meal. 07/15/22   Vernel Therisa HERO, CNM  ibuprofen  (ADVIL ) 600 MG tablet Take 1 tablet (600 mg total) by mouth every 6 (six) hours. 07/15/22   Vernel Therisa HERO, CNM  ibuprofen  (ADVIL ) 800 MG tablet Take 1 tablet (800 mg total) by mouth 3 (three) times daily. 10/03/22   Redwine, Madison A, PA-C  metroNIDAZOLE  (FLAGYL ) 500 MG tablet Take 1 tablet (500 mg total) by mouth 2 (two) times daily. 10/03/22   Redwine, Madison A, PA-C  NIFEdipine  (ADALAT  CC) 60 MG 24 hr tablet Take 1 tablet (60 mg total) by mouth daily. 07/15/22   Vernel Therisa HERO, CNM  pantoprazole  (PROTONIX ) 40 MG tablet Take 1 tablet (40 mg total) by mouth 2 (two) times daily. 07/15/22   Vernel Therisa HERO, CNM    Allergies: Patient has no known allergies.  Review of Systems  Cardiovascular:  Positive for chest pain.    Updated Vital Signs BP 139/85   Pulse 83   Temp 98.2 F (36.8 C) (Oral)   Resp 20   Ht 5' 7 (1.702 m)   Wt 99.8 kg   LMP 02/26/2024 (Exact Date)   SpO2 100%   BMI 34.46 kg/m   Physical Exam Vitals reviewed.  Constitutional:      General: She is not in acute distress.    Appearance: She is not ill-appearing.  HENT:     Head: Normocephalic and atraumatic.  Neck:     Vascular: No JVD.     Trachea: Trachea normal.  Cardiovascular:     Rate and Rhythm: Normal rate and regular rhythm.     Pulses:          Radial pulses are 2+ on the right side and 2+ on  the left side.     Heart sounds: Normal heart sounds. No murmur heard. Pulmonary:     Effort: Pulmonary effort is normal. No tachypnea.     Breath sounds: Normal breath sounds. No wheezing, rhonchi or rales.  Abdominal:     General: There is no distension.     Palpations: Abdomen is soft.     Tenderness: There is no abdominal tenderness.  Musculoskeletal:     Right lower leg: No edema.     Left lower leg: No edema.  Neurological:     Mental Status: She is alert.  Psychiatric:        Behavior: Behavior is cooperative.     (all labs ordered are listed, but only abnormal results are displayed) Labs Reviewed  COMPREHENSIVE METABOLIC PANEL WITH GFR - Abnormal; Notable for the following components:      Result Value   Calcium  8.8 (*)    All other components within normal limits  URINALYSIS, ROUTINE W REFLEX MICROSCOPIC - Abnormal; Notable for the following components:   APPearance HAZY (*)    Leukocytes,Ua TRACE (*)    Bacteria, UA FEW (*)    All other components within normal limits  HCG, SERUM, QUALITATIVE  CBC WITH DIFFERENTIAL/PLATELET  LIPASE, BLOOD  TROPONIN I (HIGH SENSITIVITY)    EKG: EKG Interpretation Date/Time:  Monday February 28 2024 16:30:05 EDT Ventricular Rate:  80 PR Interval:  176 QRS Duration:  76 QT Interval:  388 QTC Calculation: 447 R Axis:   28  Text Interpretation: Normal sinus rhythm Normal ECG When compared with ECG of 15-Apr-2022 23:53, PREVIOUS ECG IS PRESENT Confirmed by Ruthe Cornet [phone]) on 02/28/2024 5:59:59 PM  Radiology: [facility] Chest 2 View Result Date: 02/28/2024 CLINICAL DATA:  Chest pain with shortness of breath and cough. EXAM: CHEST - 2 VIEW COMPARISON:  October 03, 2022 FINDINGS: The heart size and mediastinal contours are within normal limits. Both lungs are clear. The visualized skeletal structures are unremarkable. IMPRESSION: No active cardiopulmonary disease. Electronically Signed   By: Suzen Dials M.D.   On: 02/28/2024 18:14      Procedures   Medications Ordered in the ED  ipratropium-albuterol  (DUONEB) 0.5-2.5 (3) MG/3ML nebulizer solution 3 mL (has no administration in time range)  ipratropium-albuterol  (DUONEB) 0.5-2.5 (3) MG/3ML nebulizer solution 3 mL (3 mLs Nebulization Given 02/28/24 1825)  dexamethasone  (DECADRON ) tablet 12 mg (12 mg Oral Given 02/28/24 1821)    Clinical Course as of 02/28/24 2246  Mon Feb 28, 2024  1801 Chest x-ray reviewed by me: Trachea is midline.  No evidence of pneumothorax, pleural effusion,  cardiomegaly, pulmonary edema or focal consolidation.  No subdiaphragmatic air [AG]  1802 EKG reviewed by me: Normal sinus rhythm with normal axis and intervals.  No evidence of acute ischemic change, heart block or dysrhythmia [AG]  1809 PERC negative [AG]    Clinical Course User Index [AG] Nada Chroman, DO                                 Medical Decision Making Amount and/or Complexity of Data Reviewed Labs: ordered. Radiology: ordered.  Risk Prescription drug management.   On initial valuation patient is hemodynamically stable, afebrile and not in acute distress.  Based on patient's history differential diagnosis includes pneumonia, pneumothorax, acute asthma exacerbation, pulmonary embolism, ACS/MI.  Will obtain laboratory studies as well as chest x-ray and EKG.  Based upon patient's shortness of breath we will give DuoNeb as well as Decadron  for asthma exacerbation.  Chest x-ray obtained and reviewed without evidence of pneumothorax, pleural effusion, pulmonary edema or pneumonia.  Less likely ACS/MI as patient was endorsing pleuritic chest pain, EKG nonischemic and initial troponin within normal limits.  Less likely pulmonary embolism as patient is PERC negative with Wells score 0 and has no associated risk factors for pulmonary embolism.  On reevaluation of the patient after DuoNeb therapy patient states that she no longer has shortness of breath.  Patient is not tachypneic,  hypoxic and has no evidence of wheezing on pulmonary auscultation.  Will give patient prescription for albuterol  inhaler at discharge and recommend patient follow-up with her PCP for asthma management.  Patient was given strict return precautions and agreed with and understood plan of care at time of discharge     Final diagnoses:  Mild intermittent asthma with exacerbation    ED Discharge Orders          Ordered    albuterol  (VENTOLIN  HFA) 108 (90 Base) MCG/ACT inhaler  Every 4 hours PRN        02/28/24 1846           Chroman Nada DO Emergency Medicine PGY2     Nada Chroman, DO 02/28/24 2246    Ruthe Cornet, DO 02/28/24 2248

## 2024-02-28 NOTE — ED Triage Notes (Signed)
 Here by POV from home for chest and back pain, as well as sob, non-productive cough, nausea, diarrhea, dysuria, and abd pain. Denies syncope, fever, vomiting. Sx onset 4d ago. No relief with ibuprofen , naproxen and neb. Rates pain 10/10. Alert, NAD, calm, interactive, resps e/u, speaking in clear complete sentences. VSS.

## 2024-02-28 NOTE — ED Provider Notes (Signed)
 Patient here with some nonproductive cough chest pain upper back pain.  History of asthma.  History of anxiety depression bipolar schizophrenia.  She has been using her child's inhaler with some improvement.  No recent surgery or travel.  She is PERC negative and doubt PE.  EKG shows sinus rhythm.  No ischemic changes.  Differential diagnosis likely reactive airway process.  Seems less likely to be ACS pneumonia.  She is having some pain with urination.  Will check CBC BMP troponin chest x-ray urinalysis.  Per my review interpretation of labs no significant leukocytosis anemia or electrolyte abnormality or kidney injury.  Chest x-ray negative for pneumonia per radiology report.  EKG is reassuring.  Troponin normal.  She is feeling better after breathing treatment and steroids.  Will treat her for reactive airway process.  Discharged in good condition.  This chart was dictated using voice recognition software.  Despite best efforts to proofread,  errors can occur which can change the documentation meaning.    Ruthe Cornet, DO 02/28/24 [facility]

## 2024-04-24 DIAGNOSIS — R10A1 Flank pain, right side: Secondary | ICD-10-CM | POA: Diagnosis not present

## 2024-04-24 DIAGNOSIS — Z202 Contact with and (suspected) exposure to infections with a predominantly sexual mode of transmission: Secondary | ICD-10-CM | POA: Diagnosis not present

## 2024-04-24 DIAGNOSIS — N761 Subacute and chronic vaginitis: Secondary | ICD-10-CM | POA: Diagnosis not present

## 2024-04-24 DIAGNOSIS — R300 Dysuria: Secondary | ICD-10-CM | POA: Diagnosis not present

## 2024-04-24 DIAGNOSIS — M5431 Sciatica, right side: Secondary | ICD-10-CM | POA: Diagnosis not present

## 2024-04-25 DIAGNOSIS — Z202 Contact with and (suspected) exposure to infections with a predominantly sexual mode of transmission: Secondary | ICD-10-CM | POA: Diagnosis not present

## 2024-04-25 DIAGNOSIS — N761 Subacute and chronic vaginitis: Secondary | ICD-10-CM | POA: Diagnosis not present

## 2024-04-25 DIAGNOSIS — E7800 Pure hypercholesterolemia, unspecified: Secondary | ICD-10-CM | POA: Diagnosis not present

## 2024-04-25 DIAGNOSIS — R82998 Other abnormal findings in urine: Secondary | ICD-10-CM | POA: Diagnosis not present

## 2024-04-25 DIAGNOSIS — R7303 Prediabetes: Secondary | ICD-10-CM | POA: Diagnosis not present

## 2024-04-27 DIAGNOSIS — F4310 Post-traumatic stress disorder, unspecified: Secondary | ICD-10-CM | POA: Diagnosis not present

## 2024-04-27 DIAGNOSIS — N390 Urinary tract infection, site not specified: Secondary | ICD-10-CM | POA: Diagnosis not present

## 2024-04-27 DIAGNOSIS — R10A1 Flank pain, right side: Secondary | ICD-10-CM | POA: Diagnosis not present

## 2024-04-27 DIAGNOSIS — I10 Essential (primary) hypertension: Secondary | ICD-10-CM | POA: Diagnosis not present
